# Patient Record
Sex: Female | Born: 1965
Health system: Southern US, Community
[De-identification: ages and names within clinical notes are randomized; demographics above are authoritative.]

## PROBLEM LIST (undated history)

## (undated) DIAGNOSIS — Z8719 Personal history of other diseases of the digestive system: Secondary | ICD-10-CM

## (undated) DIAGNOSIS — Z87442 Personal history of urinary calculi: Secondary | ICD-10-CM

## (undated) DIAGNOSIS — I10 Essential (primary) hypertension: Secondary | ICD-10-CM

## (undated) DIAGNOSIS — Z8742 Personal history of other diseases of the female genital tract: Secondary | ICD-10-CM

## (undated) DIAGNOSIS — Z87410 Personal history of cervical dysplasia: Secondary | ICD-10-CM

## (undated) DIAGNOSIS — E119 Type 2 diabetes mellitus without complications: Secondary | ICD-10-CM

## (undated) DIAGNOSIS — N200 Calculus of kidney: Secondary | ICD-10-CM

## (undated) DIAGNOSIS — N201 Calculus of ureter: Secondary | ICD-10-CM

## (undated) DIAGNOSIS — G629 Polyneuropathy, unspecified: Secondary | ICD-10-CM

## (undated) DIAGNOSIS — M545 Low back pain, unspecified: Secondary | ICD-10-CM

## (undated) DIAGNOSIS — Z9884 Bariatric surgery status: Secondary | ICD-10-CM

## (undated) DIAGNOSIS — K219 Gastro-esophageal reflux disease without esophagitis: Secondary | ICD-10-CM

## (undated) DIAGNOSIS — Z973 Presence of spectacles and contact lenses: Secondary | ICD-10-CM

## (undated) DIAGNOSIS — G709 Myoneural disorder, unspecified: Secondary | ICD-10-CM

## (undated) HISTORY — DX: Essential (primary) hypertension: I10

## (undated) HISTORY — PX: OTHER SURGICAL HISTORY: SHX169

## (undated) HISTORY — PX: LAPAROSCOPIC GASTRIC BANDING WITH HIATAL HERNIA REPAIR: SHX6351

## (undated) HISTORY — PX: CARPAL TUNNEL RELEASE: SHX101

## (undated) HISTORY — DX: Low back pain, unspecified: M54.50

## (undated) HISTORY — DX: Polyneuropathy, unspecified: G62.9

---
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
## 1998-01-11 ENCOUNTER — Other Ambulatory Visit: Admission: RE | Admit: 1998-01-11 | Discharge: 1998-01-11 | Payer: Self-pay | Admitting: Obstetrics and Gynecology

## 1998-10-20 ENCOUNTER — Encounter: Admission: RE | Admit: 1998-10-20 | Discharge: 1999-01-18 | Payer: Self-pay | Admitting: Family Medicine

## 1999-01-14 ENCOUNTER — Other Ambulatory Visit: Admission: RE | Admit: 1999-01-14 | Discharge: 1999-01-14 | Payer: Self-pay | Admitting: Obstetrics and Gynecology

## 1999-02-11 ENCOUNTER — Other Ambulatory Visit: Admission: RE | Admit: 1999-02-11 | Discharge: 1999-02-11 | Payer: Self-pay | Admitting: Obstetrics and Gynecology

## 1999-02-11 ENCOUNTER — Encounter (INDEPENDENT_AMBULATORY_CARE_PROVIDER_SITE_OTHER): Payer: Self-pay

## 1999-05-17 ENCOUNTER — Ambulatory Visit (HOSPITAL_BASED_OUTPATIENT_CLINIC_OR_DEPARTMENT_OTHER): Admission: RE | Admit: 1999-05-17 | Discharge: 1999-05-17 | Payer: Self-pay | Admitting: Orthopaedic Surgery

## 1999-09-07 ENCOUNTER — Other Ambulatory Visit: Admission: RE | Admit: 1999-09-07 | Discharge: 1999-09-07 | Payer: Self-pay | Admitting: Obstetrics and Gynecology

## 2001-05-20 ENCOUNTER — Emergency Department (HOSPITAL_COMMUNITY): Admission: EM | Admit: 2001-05-20 | Discharge: 2001-05-20 | Payer: Self-pay | Admitting: Emergency Medicine

## 2001-05-20 ENCOUNTER — Encounter: Payer: Self-pay | Admitting: Emergency Medicine

## 2002-08-12 ENCOUNTER — Other Ambulatory Visit: Admission: RE | Admit: 2002-08-12 | Discharge: 2002-08-12 | Payer: Self-pay | Admitting: Obstetrics and Gynecology

## 2002-11-19 ENCOUNTER — Encounter: Admission: RE | Admit: 2002-11-19 | Discharge: 2003-02-17 | Payer: Self-pay | Admitting: Family Medicine

## 2003-08-14 ENCOUNTER — Other Ambulatory Visit: Admission: RE | Admit: 2003-08-14 | Discharge: 2003-08-14 | Payer: Self-pay | Admitting: Obstetrics and Gynecology

## 2004-06-03 ENCOUNTER — Ambulatory Visit (HOSPITAL_COMMUNITY): Admission: RE | Admit: 2004-06-03 | Discharge: 2004-06-03 | Payer: Self-pay | Admitting: Obstetrics and Gynecology

## 2004-06-14 ENCOUNTER — Ambulatory Visit: Payer: Self-pay | Admitting: *Deleted

## 2004-08-01 ENCOUNTER — Encounter: Admission: RE | Admit: 2004-08-01 | Discharge: 2004-10-30 | Payer: Self-pay | Admitting: Endocrinology

## 2004-09-07 ENCOUNTER — Other Ambulatory Visit: Admission: RE | Admit: 2004-09-07 | Discharge: 2004-09-07 | Payer: Self-pay | Admitting: Obstetrics and Gynecology

## 2004-10-18 ENCOUNTER — Ambulatory Visit (HOSPITAL_COMMUNITY): Admission: RE | Admit: 2004-10-18 | Discharge: 2004-10-18 | Payer: Self-pay | Admitting: Obstetrics and Gynecology

## 2004-10-25 ENCOUNTER — Ambulatory Visit: Payer: Self-pay | Admitting: *Deleted

## 2004-10-25 ENCOUNTER — Ambulatory Visit (HOSPITAL_COMMUNITY): Admission: RE | Admit: 2004-10-25 | Discharge: 2004-10-25 | Payer: Self-pay | Admitting: *Deleted

## 2005-02-14 ENCOUNTER — Inpatient Hospital Stay (HOSPITAL_COMMUNITY): Admission: AD | Admit: 2005-02-14 | Discharge: 2005-02-14 | Payer: Self-pay | Admitting: Obstetrics and Gynecology

## 2005-02-17 ENCOUNTER — Inpatient Hospital Stay (HOSPITAL_COMMUNITY): Admission: AD | Admit: 2005-02-17 | Discharge: 2005-02-17 | Payer: Self-pay | Admitting: Obstetrics and Gynecology

## 2005-03-14 ENCOUNTER — Inpatient Hospital Stay (HOSPITAL_COMMUNITY): Admission: AD | Admit: 2005-03-14 | Discharge: 2005-03-18 | Payer: Self-pay | Admitting: Obstetrics and Gynecology

## 2005-03-21 ENCOUNTER — Inpatient Hospital Stay (HOSPITAL_COMMUNITY): Admission: AD | Admit: 2005-03-21 | Discharge: 2005-03-23 | Payer: Self-pay | Admitting: Obstetrics and Gynecology

## 2005-10-03 ENCOUNTER — Emergency Department (HOSPITAL_COMMUNITY): Admission: EM | Admit: 2005-10-03 | Discharge: 2005-10-03 | Payer: Self-pay | Admitting: Emergency Medicine

## 2006-01-11 ENCOUNTER — Other Ambulatory Visit: Admission: RE | Admit: 2006-01-11 | Discharge: 2006-01-11 | Payer: Self-pay | Admitting: Obstetrics and Gynecology

## 2006-06-20 ENCOUNTER — Ambulatory Visit (HOSPITAL_COMMUNITY): Admission: RE | Admit: 2006-06-20 | Discharge: 2006-06-20 | Payer: Self-pay | Admitting: Obstetrics and Gynecology

## 2006-12-18 ENCOUNTER — Encounter: Admission: RE | Admit: 2006-12-18 | Discharge: 2007-03-06 | Payer: Self-pay | Admitting: *Deleted

## 2006-12-18 ENCOUNTER — Ambulatory Visit (HOSPITAL_COMMUNITY): Admission: RE | Admit: 2006-12-18 | Discharge: 2006-12-18 | Payer: Self-pay | Admitting: *Deleted

## 2007-01-07 ENCOUNTER — Ambulatory Visit (HOSPITAL_COMMUNITY): Admission: RE | Admit: 2007-01-07 | Discharge: 2007-01-07 | Payer: Self-pay | Admitting: *Deleted

## 2007-03-26 ENCOUNTER — Encounter: Admission: RE | Admit: 2007-03-26 | Discharge: 2007-06-24 | Payer: Self-pay | Admitting: *Deleted

## 2007-04-09 ENCOUNTER — Ambulatory Visit (HOSPITAL_COMMUNITY): Admission: RE | Admit: 2007-04-09 | Discharge: 2007-04-10 | Payer: Self-pay | Admitting: *Deleted

## 2007-04-09 DIAGNOSIS — Z9884 Bariatric surgery status: Secondary | ICD-10-CM

## 2007-04-09 HISTORY — DX: Bariatric surgery status: Z98.84

## 2007-06-25 ENCOUNTER — Ambulatory Visit (HOSPITAL_COMMUNITY): Admission: RE | Admit: 2007-06-25 | Discharge: 2007-06-25 | Payer: Self-pay | Admitting: Obstetrics and Gynecology

## 2007-08-06 ENCOUNTER — Encounter: Admission: RE | Admit: 2007-08-06 | Discharge: 2007-08-06 | Payer: Self-pay | Admitting: *Deleted

## 2007-09-03 ENCOUNTER — Emergency Department (HOSPITAL_COMMUNITY): Admission: EM | Admit: 2007-09-03 | Discharge: 2007-09-03 | Payer: Self-pay | Admitting: Emergency Medicine

## 2008-05-29 ENCOUNTER — Emergency Department (HOSPITAL_COMMUNITY): Admission: EM | Admit: 2008-05-29 | Discharge: 2008-05-29 | Payer: Self-pay | Admitting: Emergency Medicine

## 2008-07-09 ENCOUNTER — Ambulatory Visit (HOSPITAL_COMMUNITY): Admission: RE | Admit: 2008-07-09 | Discharge: 2008-07-09 | Payer: Self-pay | Admitting: Obstetrics and Gynecology

## 2008-10-02 ENCOUNTER — Ambulatory Visit (HOSPITAL_BASED_OUTPATIENT_CLINIC_OR_DEPARTMENT_OTHER): Admission: RE | Admit: 2008-10-02 | Discharge: 2008-10-02 | Payer: Self-pay | Admitting: Orthopaedic Surgery

## 2009-03-12 ENCOUNTER — Encounter: Admission: RE | Admit: 2009-03-12 | Discharge: 2009-03-12 | Payer: Self-pay | Admitting: Internal Medicine

## 2009-04-20 ENCOUNTER — Emergency Department (HOSPITAL_COMMUNITY): Admission: EM | Admit: 2009-04-20 | Discharge: 2009-04-21 | Payer: Self-pay | Admitting: Emergency Medicine

## 2009-05-13 DIAGNOSIS — E282 Polycystic ovarian syndrome: Secondary | ICD-10-CM

## 2009-05-13 DIAGNOSIS — I10 Essential (primary) hypertension: Secondary | ICD-10-CM | POA: Insufficient documentation

## 2009-05-14 ENCOUNTER — Encounter: Admission: RE | Admit: 2009-05-14 | Discharge: 2009-05-14 | Payer: Self-pay | Admitting: Pulmonary Disease

## 2009-05-14 ENCOUNTER — Ambulatory Visit: Payer: Self-pay | Admitting: Pulmonary Disease

## 2009-05-14 DIAGNOSIS — J309 Allergic rhinitis, unspecified: Secondary | ICD-10-CM | POA: Insufficient documentation

## 2009-05-14 DIAGNOSIS — R05 Cough: Secondary | ICD-10-CM

## 2009-05-14 DIAGNOSIS — E119 Type 2 diabetes mellitus without complications: Secondary | ICD-10-CM | POA: Insufficient documentation

## 2009-05-19 ENCOUNTER — Telehealth (INDEPENDENT_AMBULATORY_CARE_PROVIDER_SITE_OTHER): Payer: Self-pay | Admitting: *Deleted

## 2009-05-26 ENCOUNTER — Ambulatory Visit: Payer: Self-pay | Admitting: Pulmonary Disease

## 2009-05-26 DIAGNOSIS — J069 Acute upper respiratory infection, unspecified: Secondary | ICD-10-CM | POA: Insufficient documentation

## 2009-06-15 ENCOUNTER — Encounter: Admission: RE | Admit: 2009-06-15 | Discharge: 2009-06-15 | Payer: Self-pay | Admitting: Surgery

## 2009-07-12 ENCOUNTER — Ambulatory Visit (HOSPITAL_COMMUNITY): Admission: RE | Admit: 2009-07-12 | Discharge: 2009-07-12 | Payer: Self-pay | Admitting: Obstetrics and Gynecology

## 2010-01-03 ENCOUNTER — Encounter: Admission: RE | Admit: 2010-01-03 | Discharge: 2010-01-03 | Payer: Self-pay | Admitting: Surgery

## 2010-02-14 ENCOUNTER — Ambulatory Visit (HOSPITAL_COMMUNITY): Admission: RE | Admit: 2010-02-14 | Discharge: 2010-02-14 | Payer: Self-pay | Admitting: Surgery

## 2010-04-17 ENCOUNTER — Encounter: Payer: Self-pay | Admitting: Obstetrics and Gynecology

## 2010-04-28 NOTE — Assessment & Plan Note (Signed)
Summary: rov for cough   Copy to:  [name] Primary Provider/Referring Provider:  [name]  CC:  Pt is here for a 10 day f/u appt.  Pt states cough has improved.  Pt states she is now coughing up clear sputum but believes this is d/t a "cold" she currently has.  Pt also c/o sore throat and chest 'hurts" and increased sob with exertion and at rest.   Pt states she is still using albuterol neb prn .  History of Present Illness: The pt comes in today for f/u of her chronic cough.  Her cough totally resolved with the cyclical cough protocol and behavioral techniques.  However, now she has developed what sounds like a viral URI.  She has had rhinorrhea, sore throat, and now a wet cough with clear mucus.  No f/c/s.  She has had some upper airway noise, and does feel a little more sob at times.  Current Medications (verified): 1)  Multivitamins  Tabs (Multiple Vitamin) .Marland Kitchen.. 1 Once Daily 2)  Goodys Body Pain 500-325 Mg Pack (Aspirin-Acetaminophen) .... As Directed As Needed 3)  Nasal Moisturizer 0.65 % Soln (Saline) .... 2 Sprays Each Nostril Every 3 Hours As Needed 4)  Protonix 40 Mg Tbec (Pantoprazole Sodium) .Marland Kitchen.. 1 Once Daily 5)  Claritin 10 Mg Caps (Loratadine) .Marland Kitchen.. 1 Once Daily 6)  Albuterol Sulfate (2.5 Mg/95ml) 0.083%  Nebu (Albuterol Sulfate) .Marland Kitchen.. 1 Vial in Nebulizer Four Times A Day As Needed 7)  Tessalon Perles 100 Mg  Caps (Benzonatate) .... One To Two By Mouth 3-4 Times Daily 8)  Tussionex Pennkinetic Er 8-10 Mg/91ml Lqcr (Chlorpheniramine-Hydrocodone) .Marland Kitchen.. 1 Tsp By Mouth Every 12 Hours As Needed 9)  Nasonex 50 Mcg/act Susp (Mometasone Furoate) .... 2 Sprays in Each Nostril Each  Morning  Allergies (verified): 1)  ! Biaxin  Review of Systems      See HPI  Vital Signs:  Patient profile:   45 year old female Height:      62 inches Weight:      118.25 pounds BMI:     21.71 O2 Sat:      100 % on Room air Temp:     97.6 degrees F oral Pulse rate:   73 / minute Cuff size:    regular  Vitals Entered By: Arman Filter LPN (May 26, 9560 11:30 AM)  O2 Flow:  Room air CC: Pt is here for a 10 day f/u appt.  Pt states cough has improved.  Pt states she is now coughing up clear sputum but believes this is d/t a "cold" she currently has.  Pt also c/o sore throat, chest 'hurts" and increased sob with exertion and at rest.   Pt states she is still using albuterol neb prn  Comments Medications reviewed with patient Arman Filter LPN  May 27, 1306 11:36 AM    Physical Exam  General:  thin female in nad Nose:  no purulence noted. Lungs:  totally clear to auscultation no wheezing or rhonchi Heart:  rrr Extremities:  no edema or cyanosis   Impression & Recommendations:  Problem # 1:  URI (ICD-465.9)  the pt states her current cough is due to a respiratory illness, and is not the same as her dry hacky cough I saw her for initially.  It is most likely viral in nature, but she will let us know if she begins to cough up purulence.  She can use otc meds for this.  I did remind her  that she still needs a repeat spirometry when the cough is better to make sure she doesn't have airway disease.  She had great difficulty at prior testing due to cough and probably upper airway edema, with truncation of FVL.  Problem # 2:  COUGH, CHRONIC (ICD-786.2)  this resolved with cyclical cough protocol and behavioral techniques.  I have reminded her this can recur if she starts to cough and lets it begin to escalate and create upper airway irritation again.  Medications Added to Medication List This Visit: 1)  Nasonex 50 Mcg/act Susp (Mometasone furoate) .... 2 sprays in each nostril each  morning  Other Orders: Est. Patient Level III (91478)  Patient Instructions: 1)  can try over the counter meds for your cold symptoms....dayquil, tylenol cold and sinus, etc 2)  don't forget to use hard candy, avoid overuse of voice, no throat clearing, to help keep cough down until you are over  your cold. 3)  try and avoid sick children, or use alcohol gel a lot. 4)  once you are over your cold, we need to repeat your breathing studies to make sure you do not have asthma.  Please call to schedule once cold is over.   Immunization History:  Influenza Immunization History:    Influenza:  historical (03/27/2009)

## 2010-04-28 NOTE — Assessment & Plan Note (Signed)
Summary: consult for chronic cough   Copy to:  Stacey Riddle Primary Provider/Referring Provider:  Joselyn Riddle  CC:  Pulmonary Consult.  History of Present Illness: The pt comes in today for evaluation of chronic cough.  Her cough started in Oct of last year with a "head cold".  This resolved, but her cough persisted at a moderate level until Jan of this year when it began to escalate.  She began to notice sob with her cough paroxysms, and describes classic upper airway pseudowheezing.  She also had gurgling in her throat area.  Her cough is primarily dry, and is worse on lying down and with prolonged conversation.  She does have a nasal voice, sinus congestion, and chronic throat clearing.  She denies any reflux symptoms currently, but had issues with this prior to her lap band.  She has been treated with abx, prednisone, and bronchodilators with no significant improvement.  A cxr done last month showed no acute process.  Preventive Screening-Counseling & Management  Alcohol-Tobacco     Smoking Status: never  Current Medications (verified): 1)  Multivitamins  Tabs (Multiple Vitamin) .Marland Kitchen.. 1 Once Daily 2)  Goodys Body Pain 500-325 Mg Pack (Aspirin-Acetaminophen) .... As Directed As Needed 3)  Nasal Moisturizer 0.65 % Soln (Saline) .... 2 Sprays Each Nostril Every 3 Hours As Needed 4)  Protonix 40 Mg Tbec (Pantoprazole Sodium) .Marland Kitchen.. 1 Once Daily 5)  Claritin 10 Mg Caps (Loratadine) .Marland Kitchen.. 1 Once Daily 6)  Proair Hfa 108 (90 Base) Mcg/act  Aers (Albuterol Sulfate) .Marland Kitchen.. 1-2 Puffs Every 4-6 Hours As Needed 7)  Albuterol Sulfate (2.5 Mg/72ml) 0.083%  Nebu (Albuterol Sulfate) .Marland Kitchen.. 1 Vial in Nebulizer Four Times A Day As Needed  Allergies (verified): 1)  ! Biaxin  Past History:  Past Medical History:   ALLERGIC RHINITIS (ICD-477.9) DM (ICD-250.00) POLYCYSTIC OVARIAN DISEASE (ICD-256.4) Hx of HYPERTENSION (ICD-401.9)    Past Surgical History: Lap band surgery 1/09 C section Carpal tunnel  release- both hands  Family History: Reviewed history and no changes required. heart disease: father cancer: mother (breast)   Social History: Reviewed history from 05/13/2009 and no changes required. 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  Smoking Status:  never  Review of Systems       The patient complains of shortness of breath with activity, non-productive cough, and weight change.  The patient denies shortness of breath at rest, productive cough, coughing up blood, chest pain, irregular heartbeats, acid heartburn, indigestion, loss of appetite, abdominal pain, difficulty swallowing, sore throat, tooth/dental problems, headaches, nasal congestion/difficulty breathing through nose, sneezing, itching, ear ache, anxiety, depression, hand/feet swelling, joint stiffness or pain, rash, change in color of mucus, and fever.    Vital Signs:  Patient profile:   45 year old female Height:      62 inches Weight:      121 pounds BMI:     22.21 O2 Sat:      99 % on Room air Temp:     98.3 degrees F oral Pulse rate:   93 / minute BP sitting:   136 / 86  (right arm) Cuff size:   regular  Vitals Entered By: Stacey Filter LPN (May 14, 2009 2:31 PM)  O2 Flow:  Room air CC: Pulmonary Consult Comments Medications reviewed with patient Stacey Filter LPN  May 14, 2009 2:32 PM    Physical Exam  General:  thin female in nad Eyes:  PERRLA and EOMI.   Nose:  swollen and erythematous turbinates,  no purulence noted. Mouth:  mucus in back of throat. Neck:  no jvd, ?right sided thyromegaly Lungs:  totally clear to auscultation Heart:  rrr, no mrg Abdomen:  soft and nontender, bs+ Extremities:  no edema noted, pulses intact distally no cyanosis Neurologic:  alert and oriented, moves all 4.   Impression & Recommendations:  Problem # 1:  COUGH, CHRONIC (ICD-786.2) the pt's cough sounds more upper airway in origin than lower.  She had difficutly with spirometry  today, but did not show obvious obstruction.  However, there was an abnormal flow volume loop that may be due to upper airway edema from all of her coughing.  She has a very nasal voice and sinus symptoms, so I wonder if she may have chronic sinusitis driving all of this.  I suspect the main cause of her cough is a cyclical mechanism after her URI.  I have had a long conversation with her about how we treat upper airway cough.  I have asked her to stay on reflux meds, to try nasal ICS for her swollen turbs noted on exam with nasal congestion, and will also check limited ct sinuses for completeness with persistent symptoms.  I have recommended starting her on the cyclical cough protocol, and have given her an instruction sheet.  She is to start this when she has 3 consecutive days to complete.  Medications Added to Medication List This Visit: 1)  Proair Hfa 108 (90 Base) Mcg/act Aers (Albuterol sulfate) .Marland Kitchen.. 1-2 puffs every 4-6 hours as needed 2)  Albuterol Sulfate (2.5 Mg/78ml) 0.083% Nebu (Albuterol sulfate) .Marland Kitchen.. 1 vial in nebulizer four times a day as needed 3)  Tessalon Perles 100 Mg Caps (Benzonatate) .... One to two by mouth 3-4 times daily 4)  Tussicaps 10-8 Mg Xr12h-cap (Hydrocod polst-chlorphen polst) .... One every 12hrs if needed.  Other Orders: Consultation Level IV (16109) Radiology Referral (Radiology) Spirometry w/Graph (60454)  Patient Instructions: 1)  take 3 days to do the cyclical cough protocol..See sheet. 2)  minimize your voice use until you can do protocol 3)  no throat clearing, use hard candy to bathe back of throat. 4)  will check xray of sinuses 5)  nasonex 2 each nostril each am. 6)  stop all inhalers. 7)  f/u with me in 10 days.  Prescriptions: TUSSICAPS 10-8 MG XR12H-CAP (HYDROCOD POLST-CHLORPHEN POLST) one every 12hrs if needed.  #12 x 0   Entered and Authorized by:   Stacey Share MD   Signed by:   Stacey Share MD on 05/14/2009   Method used:   Print then  Give to Patient   RxID:   [id number] TESSALON PERLES 100 MG  CAPS (BENZONATATE) One to two by mouth 3-4 times daily  #30 x 1   Entered and Authorized by:   Stacey Share MD   Signed by:   Stacey Share MD on 05/14/2009   Method used:   Print then Give to Patient   RxID:   [id number]    CardioPerfect Spirometry  ID: [id number] Patient: Stacey Riddle, Stacey Riddle DOB: [birth year] Age: 45 Years Old Sex: Female Race: Black Height: 62 Weight: 121 Status: Unconfirmed Past Medical History:  Current Problems:  POLYCYSTIC OVARIAN DISEASE (ICD-256.4) Hx of HYPERLIPIDEMIA (ICD-272.4) Hx of HYPERTENSION (ICD-401.9)   Recorded: 05/14/2009 3:40 PM  Parameter  Measured Predicted %Predicted FVC     3.06        2.77        110.20 FEV1  1.80        2.26        79.60 FEV1%   59.03        82.68        71.40 PEF    2.14        6.11        35   Interpretation: no obstruction by FEV1 or FEV1% FVL shows truncation of expir limb.Marland KitchenMarland Kitchen?technique, airway edema, etc. clinical correlation suggested

## 2010-04-28 NOTE — Progress Notes (Signed)
Summary: rx  Phone Note Call from Patient Call back at Home Phone [phone]   Caller: Patient Call For: Clance Summary of Call: pt unable to take the tussicaps.  They are too big for her to swallow.  She needs the Tussi syrup called in instead. [facility] [place] Initial call taken by: Eugene Gavia,  May 19, 2009 12:00 PM  Follow-up for Phone Call        Please advise, thanks Vernie Murders  May 19, 2009 12:02 PM   Additional Follow-up for Phone Call Additional follow up Details #1::        please call pharmacy and cancel prescription for tussicaps.  can have tussionex but use one teaspoon as the equivalent of one dose of tussicaps.  stick to the protocol. Additional Follow-up by: Barbaraann Share MD,  May 19, 2009 5:23 PM    Additional Follow-up for Phone Call Additional follow up Details #2::    Spoke with pt and made aware of the above recs per [facility].  Pt verbalized understading.  New rx called in for tussionex and I advised pharmacist to cancel rx for tussicaps. Follow-up by: Vernie Murders,  May 19, 2009 5:31 PM  New/Updated Medications: Sandria Senter ER 8-10 MG/5ML LQCR (CHLORPHENIRAMINE-HYDROCODONE) 1 tsp by mouth every 12 hours as needed Prescriptions: TUSSIONEX PENNKINETIC ER 8-10 MG/5ML LQCR (CHLORPHENIRAMINE-HYDROCODONE) 1 tsp by mouth every 12 hours as needed  #4 0z x 0   Entered by:   Vernie Murders   Authorized by:   Barbaraann Share MD   Signed by:   Vernie Murders on 05/19/2009   Method used:   Telephoned to ...       [facility]       [address]       [place], [place]  [address]       Ph: [phone]       Fax: [phone]   RxID:   [id number]

## 2010-06-07 LAB — CBC
Hemoglobin: 11.8 g/dL — ABNORMAL LOW (ref 12.0–15.0)
MCH: 28.4 pg (ref 26.0–34.0)
MCV: 83.4 fL (ref 78.0–100.0)
WBC: 9.3 10*3/uL (ref 4.0–10.5)

## 2010-06-07 LAB — COMPREHENSIVE METABOLIC PANEL
BUN: 9 mg/dL (ref 6–23)
CO2: 28 mEq/L (ref 19–32)
Calcium: 9 mg/dL (ref 8.4–10.5)
Chloride: 104 mEq/L (ref 96–112)
GFR calc non Af Amer: >60 mL/min (ref >60)
Glucose, Bld: 192 mg/dL — ABNORMAL HIGH (ref 70–99)
Potassium: 4 mEq/L (ref 3.5–5.1)
Total Protein: 7.1 g/dL (ref 6.0–8.3)

## 2010-06-07 LAB — DIFFERENTIAL
Basophils Absolute: 0 10*3/uL (ref 0.0–0.1)
Eosinophils Absolute: 0.1 10*3/uL (ref 0.0–0.7)
Lymphocytes Relative: 22 % (ref 12–46)
Lymphs Abs: 2 10*3/uL (ref 0.7–4.0)
Monocytes Absolute: 0.6 10*3/uL (ref 0.1–1.0)
Neutrophils Relative %: 70 % (ref 43–77)

## 2010-06-07 LAB — PREGNANCY, URINE: Preg Test, Ur: NEGATIVE

## 2010-07-04 LAB — GLUCOSE, CAPILLARY: Glucose-Capillary: 143 mg/dL — ABNORMAL HIGH (ref 70–99)

## 2010-07-04 LAB — POCT I-STAT, CHEM 8
BUN: 9 mg/dL (ref 6–23)
HCT: 39 % (ref 36.0–46.0)
Sodium: 142 mEq/L (ref 135–145)
TCO2: 29 mmol/L (ref 0–100)

## 2010-07-04 LAB — BASIC METABOLIC PANEL
BUN: 9 mg/dL (ref 6–23)
CO2: 30 mEq/L (ref 19–32)
Calcium: 9.1 mg/dL (ref 8.4–10.5)
GFR calc Af Amer: >60 mL/min (ref >60)
GFR calc non Af Amer: >60 mL/min (ref >60)
Glucose, Bld: 144 mg/dL — ABNORMAL HIGH (ref 70–99)

## 2010-07-05 ENCOUNTER — Other Ambulatory Visit (HOSPITAL_COMMUNITY): Payer: Self-pay | Admitting: Obstetrics and Gynecology

## 2010-07-05 DIAGNOSIS — Z1231 Encounter for screening mammogram for malignant neoplasm of breast: Secondary | ICD-10-CM

## 2010-07-14 ENCOUNTER — Ambulatory Visit (HOSPITAL_COMMUNITY)
Admission: RE | Admit: 2010-07-14 | Discharge: 2010-07-14 | Disposition: A | Discharge status: Home / Self Care | Payer: 59 | Source: Ambulatory Visit | Attending: Obstetrics and Gynecology | Admitting: Obstetrics and Gynecology

## 2010-07-14 DIAGNOSIS — Z1231 Encounter for screening mammogram for malignant neoplasm of breast: Secondary | ICD-10-CM

## 2010-08-09 ENCOUNTER — Emergency Department (HOSPITAL_COMMUNITY): Payer: 59

## 2010-08-09 ENCOUNTER — Emergency Department (HOSPITAL_COMMUNITY)
Admission: EM | Admit: 2010-08-09 | Discharge: 2010-08-09 | Disposition: A | Discharge status: Home / Self Care | Payer: 59 | Attending: Emergency Medicine | Admitting: Emergency Medicine

## 2010-08-09 DIAGNOSIS — Z79899 Other long term (current) drug therapy: Secondary | ICD-10-CM | POA: Insufficient documentation

## 2010-08-09 DIAGNOSIS — Z9884 Bariatric surgery status: Secondary | ICD-10-CM | POA: Insufficient documentation

## 2010-08-09 DIAGNOSIS — I10 Essential (primary) hypertension: Secondary | ICD-10-CM | POA: Insufficient documentation

## 2010-08-09 DIAGNOSIS — E119 Type 2 diabetes mellitus without complications: Secondary | ICD-10-CM | POA: Insufficient documentation

## 2010-08-09 DIAGNOSIS — R51 Headache: Secondary | ICD-10-CM | POA: Insufficient documentation

## 2010-08-09 NOTE — Op Note (Signed)
[name], Riddle         ACCOUNT NO.:  [id number]   MEDICAL RECORD NO.:  [id number]          PATIENT TYPE:  OIB   LOCATION:  0098                         FACILITY:  [facility]   PHYSICIAN:  Thornton Park. Daphine Deutscher, MD  DATE OF BIRTH:  08/25/1965   DATE OF PROCEDURE:  04/09/2007  DATE OF DISCHARGE:                               OPERATIVE REPORT   PREOPERATIVE DIAGNOSES:  1. Insulin-dependent diabetes.  2. Hypertension.  3. Significant gastroesophageal reflux disease.  4. Morbid obesity, BMI 38.5.   POSTOPERATIVE DIAGNOSES:  1. Insulin-dependent diabetes.  2. Hypertension.  3. Significant gastroesophageal reflux disease.  4. Morbid obesity, BMI 38.5.   PROCEDURE:  Laparoscopic repair of hiatal hernia (1 hour) placement of  lap band (Allergan APS lap band system).   SURGEON:  Luretha Murphy, MD.   ASSISTANT:  Jaclynn Guarneri, MD.   ANESTHESIA:  General endotracheal.   DESCRIPTION OF PROCEDURE:  Stacey Riddle was taken to room 1 on April 09, 2007 and given general anesthesia.  The abdomen was prepped with  TechniCare and draped sterilely.  The abdomen was entered through the  left upper quadrant using an OptiVu technique.  She had very thick skin  and tough fascia but I went ahead and entered slowly and carefully and  insufflated it and was able to insert the trocar to its standard depth.  After insufflation, we placed the usual trocars including a 15 in the  upper midline to the right, but I also had a place an extra 5 laterally  eventually to use the squiggly retractor to lift the liver.  The left  lateral segment was quite floppy and despite the [facility] retractor, we  were unable to get it up unless we used Mr. Hershal Coria.   I had her upper GI series on the screen and you could see a hiatal  hernia which was deceptively under played on the upper GI. She does have  significant reflux on her preop history.  First I mobilized the hiatal  area and took down the phrenicoesophageal  ligaments and there found the  esophagus was up in the chest.  We got a Penrose drain around it and we  retracted it and got it freed up and into the abdomen.  At that point, I  was able to place two sutures posteriorly through the crura to tighten  the crura and this corrected the hernia.  Prior to evening doing  anything, I did go ahead and use of the APS balloon catheter and I was  able to pull that above the GE junction with 15 mL of air indicating  that there was a significant hiatal hernia.  After repairing it, I then  went down on the right crura, created a separate spot and passed a band  passer up and around.  The APS band was introduced, brought around,  snapped in place and secured with three sutures plicating the stomach up  onto the little new small pouch.  It was then brought out through the  right-sided lower trocar.  I opened it and created a pocket and opened  this up a bit.  After  I removed all the retractors and looked around  everything appeared to be in order. I deflated the abdomen taking  everything out and then created the pocket right on the fascia and after  connecting the tubing installed it with three sutures of 2-0 Prolene  securing it nicely to the fascia. It lay nicely.   I then irrigated well, injected with some 1/2% Marcaine and then closed  the pocket around the band port with 4-0 Vicryls and Benzoin and Steri-  Strips.  In addition the other ports were closed with 4-0 Vicryl with  Benzoin Steri-Strips. The patient seemed to tolerate the procedure well  and she was taken to the recovery room in satisfactory condition.      Thornton Park Daphine Deutscher, MD  Electronically Signed     MBM/MEDQ  D:  04/09/2007  T:  04/09/2007  Job:  [id number]   cc:   Lavonda Jumbo, M.D.  Fax: [phone]   Dorisann Frames, M.D.  Fax: [phone]

## 2010-08-09 NOTE — Op Note (Signed)
NAME:  Stacey Riddle, Stacey Riddle         ACCOUNT NO.:  [id number]   MEDICAL RECORD NO.:  [id number]          PATIENT TYPE:  AMB   LOCATION:  DSC                          FACILITY:  [facility]   PHYSICIAN:  Vanita Panda. Magnus Ivan, M.D.DATE OF BIRTH:  [birth year]   DATE OF PROCEDURE:  10/02/2008  DATE OF DISCHARGE:                               OPERATIVE REPORT   PREOPERATIVE DIAGNOSIS:  Left wrist and hand carpal tunnel syndrome.   POSTOPERATIVE DIAGNOSIS:  Left wrist and hand carpal tunnel syndrome.   PROCEDURE:  Left open carpal tunnel release.   SURGEON:  Vanita Panda. Magnus Ivan, MD   ANESTHESIA:  General.   TOURNIQUET TIME:  11 minutes.   BLOOD LOSS:  Minimal.   COMPLICATIONS:  None.   INDICATIONS:  [name] is a 45 year old with a history of  bilateral carpal tunnel syndrome.  She has had previous right open  carpal tunnel release, and now elected to undergo a left open carpal  tunnel release.  She has had persistent numbness and tingling and  positive Phalen and Tinel sign and difficulty gripping objects.  She  understood the risks and benefits of the surgery and we want to try to  do this under Bier block.   PROCEDURE. DESCRIPTION:  After informed consent was obtained,  appropriate left wrist was marked.  She was brought to the operating  room and placed supine on the operating table.  They attempted second IV  for Bier block, were unable to do this, so they proceeded with general  anesthesia via an LMA.  Her left hand was then prepped and draped with  DuraPrep and sterile drapes including a sterile stockinette.  A time-out  was called to identify the correct patient and the correct left wrist.  An Esmarch was used to wrap out the wrist and tourniquet was inflated to  300 mmHg of pressure.  I then made an incision on the palm of the hand  at the level of Kaplan's cardinal line.  They were dissected down to the  distal edge of the transverse carpal ligament.  I then used  a protector  above the median nerve and was able to dissect with a blade and divide  the transverse carpal ligament in its entirety.  I was able to visualize  the median nerve as well as the motor branch and these were intact.  I  then copiously irrigated the wound and closed the tissue with  interrupted 3-0 nylon suture at the skin level.  The incision was  infiltrated with 0.25% plain Marcaine.  A Xeroform followed by a well-  padded sterile dressing was applied.  Tourniquet was let down and the  fingers did pink in nicely.  The patient was awakened, extubated, and  taken to the recovery room in stable  condition.  All final counts were correct and no complications noted.  Postoperatively, she remained in the splint with removing of her  dressings in 2 days and slowly work on range of motion of her fingers  and wrist.  Follow up in the office will be in 2 weeks.      Vanita Panda.  Magnus Ivan, M.D.  Electronically Signed     CYB/MEDQ  D:  10/02/2008  T:  10/02/2008  Job:  [id number]

## 2010-08-12 NOTE — Discharge Summary (Signed)
Stacey Riddle, Stacey Riddle         ACCOUNT NO.:  [id number]   MEDICAL RECORD NO.:  [id number]          PATIENT TYPE:  INP   LOCATION:  9317                          FACILITY:  [facility]   PHYSICIAN:  Huel Cote, M.D. DATE OF BIRTH:  Oct 21, 1965   DATE OF ADMISSION:  03/21/2005  DATE OF DISCHARGE:                                 DISCHARGE SUMMARY   DISCHARGE DIAGNOSES:  1.  Status post cesarean section on March 16, 2005, who developed a post      surgical incision infection requiring intravenous antibiotics and      drainage.  2.  Diabetes mellitus.  3.  Chronic hypertension   DISCHARGE MEDICATIONS:  1.  Augmentin 875 mg p.o. b.i.d.  2.  Percocet one to two tablets p.o. every 4 hours p.r.n.  3.  Insulin pump, to have her settings as directed by Dr. Talmage Nap.  4.  Labetalol 400 mg p.o. b.i.d.  5.  Aldomet 500 mg p.o. q.a.m. and 1000 mg p.o. q.h.s.   DISCHARGE FOLLOWUP:  The patient is to follow up with Dr. Talmage Nap in the next  several days. If she is unable to get in with her office she will come into  our office for a blood pressure check. She also needs to return our office  within 7-10 days for an incision check.   DISCHARGE INSTRUCTIONS:  The patient will continue wet-to-dry dressing  changes twice daily. Her mother has been instructed on these and she has  been given the materials to perform these until coming into the office.   HOSPITAL COURSE:  The patient is a 45 year old G1 P1 who came in 6 days  after her low transverse cesarean section with an infection of her incision  which required opening and draining of some purulent material. This was  approximately 3 or 4 inches long on the left side of the incision. The  patient did have a temperature on arrival of 101.5 and a white blood cell  count of 20,000. Therefore, with her diabetes and other risk factors it was  felt that she should be admitted for IV antibiotic therapy. She was admitted  and placed on IV Unasyn and  received over 24 hours of therapy with her fever  defervescing very quickly. On hospital day #2 she had been afebrile since  admission and was felt stable for discharge home with outpatient regimen to  care for her incision. She was therefore discharged home with wet-to-dry day  dressing changes to be performed twice daily and is going to be placed on  Augmentin p.o. for continued antibiotic coverage. The patient also will be  followed by Dr. Talmage Nap to adjust her insulin pump as needed. Her blood sugars  have been relatively well controlled in the 87-164 range. Her blood  pressures were 173-  193 over 82-101. Therefore, her labetalol was increased to 400 mg p.o. twice  daily and she will confer with Dr. Talmage Nap whether they wish to continue this  medicine or continue changing to another therapy. As stated, she will follow  up with one of [place] in the next 3-4 days to see how her blood  pressure is  responding.      Huel Cote, M.D.  Electronically Signed     KR/MEDQ  D:  03/23/2005  T:  03/23/2005  Job:  [id number]

## 2010-08-12 NOTE — H&P (Signed)
Stacey Riddle, Stacey Riddle         ACCOUNT NO.:  [id number]   MEDICAL RECORD NO.:  [id number]          PATIENT TYPE:  MAT   LOCATION:  [facility]                          FACILITY:  [facility]   PHYSICIAN:  Malachi Pro. Ambrose Mantle, M.D. DATE OF BIRTH:  Jun 18, 1965   DATE OF ADMISSION:  03/21/2005  DATE OF DISCHARGE:                                HISTORY & PHYSICAL   PRESENT ILLNESS:  A 45 year old black female, para 1-0-0-1, who is now six  days status post low-transverse cervical C-section for failure to progress  in labor.  The patient's pregnancy was complicated by chronic hypertension,  type 2 diabetes on an insulin pump, advanced maternal age, and Group B strep  carrier.  The patient was treated with a Glucomander during her labor.  She  was discharged three days post-op on Aldomet 500 every morning and 1,000  every afternoon, labetalol 300 by mouth twice a day, and insulin pump for  her diabetes.  After discharge, on March 18, 2005, the patient developed  chills and fever, on March 19, 2005, and then does not remember having a  fever on Christmas day but on the day after Christmas again began having  chills and fever.  In the evening of March 21, 2005, she stood up and  noticed blood and pus running out of her incision.  She brought herself to  the hospital and was evaluated in the maternity admission unit.   PAST MEDICAL HISTORY:  Revealed allergy to [facility].  It caused cramping and  stomach pain but no rash, shortness of breath, or other signs of a true  allergy.   ILLNESSES:  1.  The patient states she has had diabetes for about 13 years.  2.  She has had polycystic ovarian syndrome.  3.  Slight cervical dysplasia.  4.  Chronic hypertension.   SURGERY:  She had a right carpal tunnel release in 2000.   FAMILY HISTORY:  Father with an MI.  Parents with chronic hypertension.  Mother with breast cancer.  Father and sister have diabetes.   OBSTETRIC HISTORY:  The patient delivered by  C-section, on March 15, 2005, a 6 pound 8 ounce female infant.   PHYSICAL EXAMINATION:  VITAL SIGNS:  On admission, temperature of 101.5,  pulse of 102, respirations 20, blood pressure 147/74.  Followup temperatures  were 100.3 and 101.3.  HEENT:  Reveal no cranial abnormalities.  Extraocular movements intact.  Nose and pharynx clear.  NECK:  Supple without thyromegaly.  BREASTS:  Soft without masses.  LUNGS:  Clear to auscultation.  HEART:  Normal size.  Slight tachycardic.  No murmurs.  ABDOMEN:  Soft, nontender.  No masses palpable.  From the left side of the  incision there is a purulent drainage, staples are still intact.  PELVIC:  Not done.  There is no CVA tenderness.  LEGS:  Negative.  No evidence of phlebitis.   ADMITTING IMPRESSION:  1.  Wound infection.  2.  Type 2 diabetes.  3.  Chronic hypertension.  4.  Obesity.   The patient is admitted for IV antibiotic therapy and blood sugar control.  I removed staples  from the left side of the incision and finger dissected  the left side of the incision.  I could not find a channel to go all the way  across the incision, so it is possible that the infection is merely in the  left side of the incision.  After I drained all the purulent material and  washed the cavity with hydrogen peroxide, I placed a 1 inch iodoform pack.      Malachi Pro. Ambrose Mantle, M.D.  Electronically Signed     TFH/MEDQ  D:  03/21/2005  T:  03/21/2005  Job:  [id number]

## 2010-09-06 ENCOUNTER — Encounter (INDEPENDENT_AMBULATORY_CARE_PROVIDER_SITE_OTHER): Payer: Self-pay | Admitting: Surgery

## 2010-09-30 ENCOUNTER — Encounter (INDEPENDENT_AMBULATORY_CARE_PROVIDER_SITE_OTHER): Payer: 59 | Admitting: Surgery

## 2010-11-15 ENCOUNTER — Encounter (INDEPENDENT_AMBULATORY_CARE_PROVIDER_SITE_OTHER): Payer: Self-pay | Admitting: General Surgery

## 2010-11-16 ENCOUNTER — Encounter (INDEPENDENT_AMBULATORY_CARE_PROVIDER_SITE_OTHER): Payer: Self-pay | Admitting: Surgery

## 2010-11-16 ENCOUNTER — Ambulatory Visit (INDEPENDENT_AMBULATORY_CARE_PROVIDER_SITE_OTHER): Payer: 59 | Admitting: Surgery

## 2010-11-16 VITALS — BP 138/86 | HR 56 | Wt 147.4 lb

## 2010-11-16 DIAGNOSIS — Z4651 Encounter for fitting and adjustment of gastric lap band: Secondary | ICD-10-CM

## 2010-11-16 DIAGNOSIS — Z9884 Bariatric surgery status: Secondary | ICD-10-CM

## 2010-11-16 NOTE — Progress Notes (Signed)
[name] is doing well and she is in the green zone. She wants to lose a little more weight. She felt she needed to fill. Added 0.25 cc to her pain.  Last year she did have some issues with coughing and it may have been b and was overfilled. I advised her about salt retention now that can make a band tighter. I'll see her back in 6 weeks unless she still doing well and she may push back in 3 months. Overall she looks great.

## 2010-12-14 LAB — BASIC METABOLIC PANEL
CO2: 30
GFR calc Af Amer: >60
GFR calc non Af Amer: >60
Glucose, Bld: 93

## 2010-12-14 LAB — URINALYSIS, ROUTINE W REFLEX MICROSCOPIC
Bilirubin Urine: NEGATIVE
Nitrite: NEGATIVE
Specific Gravity, Urine: 1.027
Urobilinogen, UA: 0.2
pH: 5.5

## 2010-12-14 LAB — CBC
Platelets: 258
RBC: 3.81 — ABNORMAL LOW
WBC: 16.1 — ABNORMAL HIGH

## 2010-12-14 LAB — HEMOGLOBIN AND HEMATOCRIT, BLOOD
HCT: 33.5 — ABNORMAL LOW
Hemoglobin: 11.1 — ABNORMAL LOW

## 2010-12-14 LAB — DIFFERENTIAL
Eosinophils Absolute: 0
Lymphocytes Relative: 16
Lymphs Abs: 2.5
Monocytes Relative: 5
Neutro Abs: 12.7 — ABNORMAL HIGH
Neutrophils Relative %: 79 — ABNORMAL HIGH

## 2010-12-14 LAB — PREGNANCY, URINE: Preg Test, Ur: NEGATIVE

## 2011-01-05 ENCOUNTER — Encounter (INDEPENDENT_AMBULATORY_CARE_PROVIDER_SITE_OTHER): Payer: 59 | Admitting: Surgery

## 2011-03-15 ENCOUNTER — Encounter (HOSPITAL_COMMUNITY): Payer: Self-pay | Admitting: *Deleted

## 2011-03-15 ENCOUNTER — Emergency Department (HOSPITAL_COMMUNITY)
Admission: EM | Admit: 2011-03-15 | Discharge: 2011-03-16 | Disposition: A | Discharge status: Home / Self Care | Payer: No Typology Code available for payment source | Attending: Emergency Medicine | Admitting: Emergency Medicine

## 2011-03-15 DIAGNOSIS — M25569 Pain in unspecified knee: Secondary | ICD-10-CM | POA: Insufficient documentation

## 2011-03-15 DIAGNOSIS — E119 Type 2 diabetes mellitus without complications: Secondary | ICD-10-CM | POA: Insufficient documentation

## 2011-03-15 DIAGNOSIS — I10 Essential (primary) hypertension: Secondary | ICD-10-CM | POA: Insufficient documentation

## 2011-03-15 DIAGNOSIS — S161XXA Strain of muscle, fascia and tendon at neck level, initial encounter: Secondary | ICD-10-CM

## 2011-03-15 DIAGNOSIS — M542 Cervicalgia: Secondary | ICD-10-CM | POA: Insufficient documentation

## 2011-03-15 DIAGNOSIS — S139XXA Sprain of joints and ligaments of unspecified parts of neck, initial encounter: Secondary | ICD-10-CM | POA: Insufficient documentation

## 2011-03-15 DIAGNOSIS — T148XXA Other injury of unspecified body region, initial encounter: Secondary | ICD-10-CM | POA: Insufficient documentation
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
## 2011-03-15 NOTE — ED Notes (Signed)
Pt ambulated from waiting room to room with no difficulty. ROM in neck intact. ROM with knee intact; no swelling or abnormality except for minor skin tear over left knee cap. Pt reports pain in left side of neck and shoulder area. Pt reports neck feels "stiff", not tender upon palpation.

## 2011-03-16 MED ORDER — DIAZEPAM 5 MG PO TABS
5.0000 mg | ORAL_TABLET | Freq: Once | ORAL | Status: AC
  Administered 2011-03-16: 5 mg via ORAL
  Filled 2011-03-16: qty 1

## 2011-03-16 MED ORDER — HYDROCODONE-ACETAMINOPHEN 7.5-500 MG/15ML PO SOLN: 15.0000 mL | Freq: Once | ORAL | Status: DC

## 2011-03-16 MED ORDER — HYDROCODONE-ACETAMINOPHEN 7.5-325 MG/15ML PO SOLN: 15.0000 mL | Freq: Four times a day (QID) | ORAL | Status: AC | PRN

## 2011-03-16 MED ORDER — IBUPROFEN 100 MG/5ML PO SUSP
400.0000 mg | Freq: Once | ORAL | Status: AC
  Administered 2011-03-16: 400 mg via ORAL
  Filled 2011-03-16: qty 5
  Filled 2011-03-16: qty 15
  Filled 2011-03-16 (×2): qty 5

## 2011-03-16 MED ORDER — DIAZEPAM 5 MG PO TABS: 5.0000 mg | ORAL_TABLET | Freq: Two times a day (BID) | ORAL | Status: AC
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
## 2011-03-16 NOTE — ED Provider Notes (Signed)
Medical screening examination/treatment/procedure(s) were performed by non-physician practitioner and as supervising physician I was immediately available for consultation/collaboration.    Cornelious Bartolucci R Sherrol Vicars, MD 03/16/11 0727 

## 2011-03-16 NOTE — ED Notes (Signed)
Pt ambulated with a steady gait; VSS; no acute signs of distress; respirations even and unlabored; skin warm and dry.

## 2011-04-13 ENCOUNTER — Encounter (HOSPITAL_COMMUNITY): Payer: Self-pay | Admitting: *Deleted

## 2011-04-13 ENCOUNTER — Emergency Department (HOSPITAL_COMMUNITY)
Admission: EM | Admit: 2011-04-13 | Discharge: 2011-04-13 | Disposition: A | Discharge status: Home / Self Care | Payer: 59 | Source: Home / Self Care | Attending: Emergency Medicine | Admitting: Emergency Medicine

## 2011-04-13 DIAGNOSIS — S139XXA Sprain of joints and ligaments of unspecified parts of neck, initial encounter: Secondary | ICD-10-CM

## 2011-04-13 DIAGNOSIS — S161XXA Strain of muscle, fascia and tendon at neck level, initial encounter: Secondary | ICD-10-CM

## 2011-04-13 DIAGNOSIS — S335XXA Sprain of ligaments of lumbar spine, initial encounter: Secondary | ICD-10-CM

## 2011-04-13 DIAGNOSIS — S39012A Strain of muscle, fascia and tendon of lower back, initial encounter: Secondary | ICD-10-CM

## 2011-04-13 MED ORDER — MELOXICAM 15 MG PO TABS: 15.0000 mg | ORAL_TABLET | Freq: Every day | ORAL | Status: DC
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
## 2011-06-07 ENCOUNTER — Other Ambulatory Visit (HOSPITAL_COMMUNITY): Payer: Self-pay | Admitting: Obstetrics and Gynecology

## 2011-06-07 DIAGNOSIS — Z1231 Encounter for screening mammogram for malignant neoplasm of breast: Secondary | ICD-10-CM

## 2011-06-29 ENCOUNTER — Encounter (INDEPENDENT_AMBULATORY_CARE_PROVIDER_SITE_OTHER): Payer: Self-pay | Admitting: Surgery

## 2011-06-29 ENCOUNTER — Ambulatory Visit (INDEPENDENT_AMBULATORY_CARE_PROVIDER_SITE_OTHER): Payer: 59 | Admitting: Surgery

## 2011-06-29 VITALS — BP 122/78 | HR 84 | Temp 97.1°F | Resp 16 | Ht 62.0 in | Wt 123.4 lb

## 2011-06-29 DIAGNOSIS — Z4651 Encounter for fitting and adjustment of gastric lap band: Secondary | ICD-10-CM | POA: Insufficient documentation

## 2011-06-29 NOTE — Progress Notes (Signed)
Stacey Riddle Stacey Riddle Body mass index is 22.57 kg/(m^2).  Having regurgitation:  yes  Nocturnal reflux?  No  Amount of fill  -0.1

## 2011-06-30 ENCOUNTER — Other Ambulatory Visit: Payer: Self-pay

## 2011-06-30 ENCOUNTER — Encounter (HOSPITAL_COMMUNITY): Payer: Self-pay | Admitting: Family Medicine

## 2011-06-30 ENCOUNTER — Emergency Department (HOSPITAL_COMMUNITY): Payer: 59

## 2011-06-30 ENCOUNTER — Emergency Department (HOSPITAL_COMMUNITY)
Admission: EM | Admit: 2011-06-30 | Discharge: 2011-06-30 | Disposition: A | Discharge status: Home / Self Care | Payer: 59 | Attending: Emergency Medicine | Admitting: Emergency Medicine

## 2011-06-30 DIAGNOSIS — I10 Essential (primary) hypertension: Secondary | ICD-10-CM | POA: Insufficient documentation

## 2011-06-30 DIAGNOSIS — E876 Hypokalemia: Secondary | ICD-10-CM | POA: Insufficient documentation

## 2011-06-30 DIAGNOSIS — R0789 Other chest pain: Secondary | ICD-10-CM | POA: Insufficient documentation

## 2011-06-30 DIAGNOSIS — R079 Chest pain, unspecified: Secondary | ICD-10-CM | POA: Insufficient documentation

## 2011-06-30 DIAGNOSIS — R51 Headache: Secondary | ICD-10-CM | POA: Insufficient documentation

## 2011-06-30 DIAGNOSIS — E119 Type 2 diabetes mellitus without complications: Secondary | ICD-10-CM | POA: Insufficient documentation

## 2011-06-30 LAB — CBC
HCT: 34.6 % — ABNORMAL LOW (ref 36.0–46.0)
Hemoglobin: 11.8 g/dL — ABNORMAL LOW (ref 12.0–15.0)
MCH: 27.6 pg (ref 26.0–34.0)
MCV: 81 fL (ref 78.0–100.0)
Platelets: 236 10*3/uL (ref 150–400)
RBC: 4.27 MIL/uL (ref 3.87–5.11)
WBC: 9.8 10*3/uL (ref 4.0–10.5)

## 2011-06-30 LAB — POCT I-STAT, CHEM 8
BUN: 6 mg/dL (ref 6–23)
Calcium, Ion: 1.21 mmol/L (ref 1.12–1.32)
Chloride: 102 mEq/L (ref 96–112)
Creatinine, Ser: 0.7 mg/dL (ref 0.50–1.10)

## 2011-06-30 MED ORDER — POTASSIUM CHLORIDE 20 MEQ/15ML (10%) PO LIQD
40.0000 meq | Freq: Once | ORAL | Status: AC
  Administered 2011-06-30: 40 meq via ORAL
  Filled 2011-06-30: qty 30

## 2011-06-30 NOTE — ED Provider Notes (Signed)
History     CSN: [id number]  Arrival date & time 06/30/11  1411   First MD Initiated Contact with Patient 06/30/11 1825      Chief Complaint  Patient presents with  . Chest Pain  . Headache    (Consider location/radiation/quality/duration/timing/severity/associated sxs/prior treatment) HPI Complains of right-sided anterior chest pain for the past 3 weeks pain is intermittent longest episode lasting 3 minutes, nonradiating sharp in quality. No associated shortness of breath nausea or sweatiness. Pain is not made worse with exertion. Nothing makes pain better or worse. Patient also complains of right-sided headache for the past 3 weeks similar to headaches she's had for several years. Had a head CT scan may 2012 which was normal. Patient also reports nausea and vomiting for several years since her lap band surgery. She was treated yesterday by Dr. Daphine Deutscher with a loosening of her lap band. She's had no nausea or vomiting today. Patient presently asymptomatic. Cardiac risk factors include diabetes hypertension and family history Past Medical History  Diagnosis Date  . Shortness of breath   . Hypertension   . Bronchitis   . Anemia   . Diabetes mellitus     Past Surgical History  Procedure Date  . Bariatric surgery   . Carpal tunnel release   . Cesarean section     Family History  Problem Relation Age of Onset  . Cancer Mother   . Hyperlipidemia Mother   . Osteoporosis Mother   . Heart disease Father   . Diabetes Father   . Hypertension Father     History  Substance Use Topics  . Smoking status: Never Smoker   . Smokeless tobacco: Not on file  . Alcohol Use: No    OB History    Grav Para Term Preterm Abortions TAB SAB Ect Mult Living                  Review of Systems  Constitutional: Negative.   Respiratory: Positive for chest tightness.   Cardiovascular: Negative.   Gastrointestinal: Negative.   Musculoskeletal: Negative.   Skin: Negative.   Neurological:  Positive for headaches.  Hematological: Negative.   Psychiatric/Behavioral: Negative.     Allergies  Clarithromycin  Home Medications   Current Outpatient Rx  Name Route Sig Dispense Refill  . GLIMEPIRIDE 1 MG PO TABS Oral Take 4 mg by mouth 2 (two) times daily.    Marland Kitchen HYDROCODONE-ACETAMINOPHEN 7.5-500 MG PO TABS Oral Take 1 tablet by mouth every 6 (six) hours as needed. For pain relief    . LORATADINE 10 MG PO TABS Oral Take 10 mg by mouth daily.    Marland Kitchen LOSARTAN POTASSIUM-HCTZ 100-25 MG PO TABS Oral Take 1 tablet by mouth at bedtime.     Marland Kitchen POLYVINYL ALCOHOL 1.4 % OP SOLN Both Eyes Place 1 drop into both eyes as needed. Dry eyes       BP 154/89  Pulse 71  Temp(Src) 98.7 F (37.1 C) (Oral)  Resp 18  SpO2 100%  Physical Exam  Nursing note and vitals reviewed. Constitutional: She appears well-developed and well-nourished.  HENT:  Head: Normocephalic and atraumatic.  Eyes: Conjunctivae are normal. Pupils are equal, round, and reactive to light.  Neck: Neck supple. No tracheal deviation present. No thyromegaly present.  Cardiovascular: Normal rate and regular rhythm.   No murmur heard. Pulmonary/Chest: Effort normal and breath sounds normal.  Abdominal: Soft. Bowel sounds are normal. She exhibits no distension. There is no tenderness.  Musculoskeletal: Normal range of  motion. She exhibits no edema and no tenderness.  Neurological: She is alert. Coordination normal.  Skin: Skin is warm and dry. No rash noted.  Psychiatric: She has a normal mood and affect.   Date: 06/30/2011  Rate: 70  Rhythm: normal sinus rhythm  QRS Axis: normal  Intervals: normal  ST/T Wave abnormalities: nonspecific T wave changes  Conduction Disutrbances:none  Narrative Interpretation:   Old EKG Reviewed: unchanged  No significabnt change from 09/30/08  Results for orders placed during the hospital encounter of 06/30/11  CBC      Component Value Range   WBC 9.8  4.0 - 10.5 (K/uL)   RBC 4.27  3.87 -  5.11 (MIL/uL)   Hemoglobin 11.8 (*) 12.0 - 15.0 (g/dL)   HCT 16.1 (*) 09.6 - 46.0 (%)   MCV 81.0  78.0 - 100.0 (fL)   MCH 27.6  26.0 - 34.0 (pg)   MCHC 34.1  30.0 - 36.0 (g/dL)   RDW 04.5  40.9 - 81.1 (%)   Platelets 236  150 - 400 (K/uL)  POCT I-STAT, CHEM 8      Component Value Range   Sodium 145  135 - 145 (mEq/L)   Potassium 2.6 (*) 3.5 - 5.1 (mEq/L)   Chloride 102  96 - 112 (mEq/L)   BUN 6  6 - 23 (mg/dL)   Creatinine, Ser 9.14  0.50 - 1.10 (mg/dL)   Glucose, Bld 98  70 - 99 (mg/dL)   Calcium, Ion 7.82  9.56 - 1.32 (mmol/L)   TCO2 29  0 - 100 (mmol/L)   Hemoglobin 12.6  12.0 - 15.0 (g/dL)   HCT 21.3  08.6 - 57.8 (%)   Comment NOTIFIED PHYSICIAN    POCT I-STAT TROPONIN I      Component Value Range   Troponin i, poc 0.00  0.00 - 0.08 (ng/mL)   Comment 3            Dg Chest 2 View  06/30/2011  *RADIOLOGY REPORT*  Clinical Data: Chest pain, headache  CHEST - 2 VIEW  Comparison: Chest x-ray of 04/20/2009  Findings: The lungs are clear.  Mediastinal contours appear stable. The heart is within normal limits in size.  A lap band is noted unchanged in position by plain film, being slightly horizontal.  No bony abnormality is seen.  IMPRESSION: No active lung disease.  Lap band unchanged in position, which is somewhat horizontal.  Original Report Authenticated By: Juline Patch, M.D.     ED Course  Procedures (including critical care time)  Labs Reviewed - No data to display No results found. 8:30 PM patient remains asymptomatic  No diagnosis found.    MDM  Chest pain highly atypical for acute coronary syndrome but given patient's cardiac risk factors needs outpatient workup spoke with Dr.Nahser who will see patient in office next week Diagnosis #1 atypical chest pain #2 nonspecific headache #3 hypokalemia        Doug Sou, MD 06/30/11 2034

## 2011-06-30 NOTE — ED Notes (Signed)
Pt reports intermittent right-sided chest pain with headache x3 weeks. Denies any other sx. NAD

## 2011-06-30 NOTE — Discharge Instructions (Signed)
Chest Pain (Nonspecific) Chest pain has many causes. Your pain could be caused by something serious, such as a heart attack or a blood clot in the lungs. It could also be caused by something less serious, such as a chest bruise or a virus. Follow up with your doctor. More lab tests or other studies may be needed to find the cause of your pain. Most of the time, nonspecific chest pain will improve within 2 to 3 days of rest and mild pain medicine. HOME CARE  For chest bruises, you may put ice on the sore area for 15 to 20 minutes, 3 to 4 times a day. Do this only if it makes you or your child feel better.   Put ice in a plastic bag.   Place a towel between the skin and the bag.   Rest for the next 2 to 3 days.   Go back to work if the pain improves.   See your doctor if the pain lasts longer than 1 to 2 weeks.   Only take medicine as told by your doctor.   Quit smoking if you smoke.  GET HELP RIGHT AWAY IF:   There is more pain or pain that spreads to the arm, neck, jaw, back, or belly (abdomen).   You or your child has shortness of breath.   You or your child coughs more than usual or coughs up blood.   You or your child has very bad back or belly pain, feels sick to his or her stomach (nauseous), or throws up (vomits).   You or your child has very bad weakness.   You or your child passes out (faints).   You or your child has a temperature by mouth above 102 F (38.9 C), not controlled by medicine.  Any of these problems may be serious and may be an emergency. Do not wait to see if the problems will go away. Get medical help right away. Call your local emergency services 911 in [place] Do not drive yourself to the hospital. MAKE SURE YOU:   Understand these instructions.   Will watch this condition.   Will get help right away if you or your child is not doing well or gets worse.  Document Released: 08/30/2007 Document Revised: 03/02/2011 Document Reviewed:  08/30/2007 [facility] Patient Information 2012 [place], [place].   If you don't hear from Dr.Nahser's office by afternoon of Monday, 07/03/2011, call to schedule an appointment. Tell office staff that you were seen here. Ask Dr. Elease Hashimoto or hit staff to recheck your serum potassium

## 2011-06-30 NOTE — ED Notes (Signed)
Notified EDP, Jacubowitz, MD of low potassium on I-Stat device.

## 2011-06-30 NOTE — ED Notes (Signed)
Hx of lap band surgery 4 years ago and has been throwing up regularly since. Thinks that chest pain may be caused by that, had lap band loosened yesterday. Has been losing hair, hasn't seen nutritionist for a couple years.

## 2011-07-17 ENCOUNTER — Ambulatory Visit (HOSPITAL_COMMUNITY)
Admission: RE | Admit: 2011-07-17 | Discharge: 2011-07-17 | Disposition: A | Discharge status: Home / Self Care | Payer: 59 | Source: Ambulatory Visit | Attending: Obstetrics and Gynecology | Admitting: Obstetrics and Gynecology

## 2011-07-17 DIAGNOSIS — Z1231 Encounter for screening mammogram for malignant neoplasm of breast: Secondary | ICD-10-CM | POA: Insufficient documentation

## 2011-07-26 ENCOUNTER — Encounter: Payer: Self-pay | Admitting: Cardiovascular Disease

## 2011-07-26 ENCOUNTER — Ambulatory Visit (INDEPENDENT_AMBULATORY_CARE_PROVIDER_SITE_OTHER): Payer: 59 | Admitting: Cardiovascular Disease

## 2011-07-26 DIAGNOSIS — R079 Chest pain, unspecified: Secondary | ICD-10-CM | POA: Insufficient documentation

## 2011-07-26 DIAGNOSIS — I10 Essential (primary) hypertension: Secondary | ICD-10-CM

## 2011-07-26 NOTE — Assessment & Plan Note (Signed)
Her blood pressure is a little bit elevated today. She did not take her medication today. She had a potassium level of 2.4 when she was seen in the emergency room several weeks ago. I suspect that she'll need a potassium supplement. She has a problem swallowing large tablets since she's had let and surgery. We'll need to call her in [employer] 25 meq a day if she needs a supplement.

## 2011-07-26 NOTE — Progress Notes (Signed)
    Stacey Riddle Date of Birth  1965/10/03 [facility]     [name] [facility]  [address]    [address]   [address] [place], [place]  [address]    [place], [place]  [address] [phone]  Fax  [phone]  [phone]  Fax [phone]  Problem list: 1. Chest pain-negative workup in the emergency room 2. History Lap band surgery - January 2009 3. Hypertension 4. Diabetes Mellitus   History of Present Illness:  She presents for follow up of an episode of chest pain.  Not associated with exercise, eating, drinking, change of position, taking a deep breath.  The pain lasted several days.  Troponin level in the ER was negative.  Current Outpatient Prescriptions on File Prior to Visit  Medication Sig Dispense Refill  . glimepiride (AMARYL) 1 MG tablet Take 4 mg by mouth 2 (two) times daily.      Marland Kitchen HYDROcodone-acetaminophen (LORTAB) 7.5-500 MG per tablet Take 1 tablet by mouth every 6 (six) hours as needed. For pain relief      . loratadine (CLARITIN) 10 MG tablet Take 10 mg by mouth daily.      Marland Kitchen losartan-hydrochlorothiazide (HYZAAR) 100-25 MG per tablet Take 1 tablet by mouth at bedtime.         Allergies  Allergen Reactions  . Clarithromycin     Stomach cramps    Past Medical History  Diagnosis Date  . Shortness of breath   . Hypertension   . Bronchitis   . Anemia   . Diabetes mellitus     Past Surgical History  Procedure Date  . Bariatric surgery   . Carpal tunnel release   . Cesarean section     History  Smoking status  . Never Smoker   Smokeless tobacco  . Not on file    History  Alcohol Use No    Family History  Problem Relation Age of Onset  . Cancer Mother   . Hyperlipidemia Mother   . Osteoporosis Mother   . Heart disease Father   . Diabetes Father   . Hypertension Father     Reviw of Systems:  Reviewed in the HPI.  All other systems are negative.  Physical Exam: Blood pressure 153/92, pulse 75, height 5' 2.5"  (1.588 m), weight 131 lb 12.8 oz (59.784 kg). General: Well developed, well nourished, in no acute distress.  Head: Normocephalic, atraumatic, sclera non-icteric, mucus membranes are moist,   Neck: Supple. Carotids are 2 + without bruits. No JVD  Lungs: Clear bilaterally to auscultation.  Heart: regular rate.  normal  S1 S2. No murmurs, gallops or rubs.  Abdomen: Soft, non-tender, non-distended with normal bowel sounds. No hepatomegaly. No rebound/guarding. No masses.  Msk:  Strength and tone are normal  Extremities: No clubbing or cyanosis. No edema.  Distal pedal pulses are 2+ and equal bilaterally.  Neuro: Alert and oriented X 3. Moves all extremities spontaneously.  Psych:  Responds to questions appropriately with a normal affect.  ECG:  IN ER.  NSR. No acute changes.  Assessment / Plan:

## 2011-07-26 NOTE — Assessment & Plan Note (Signed)
Stacey Riddle presents with episodes of chest pain. The chest pain occurred for several days. Despite this she had negative troponin levels.  I doubt that this is cardiac pain. She does have a history of diabetes mellitus but this improved dramatically when she lost a lot of weight.  I don't the patient needs a stress test at this time but I would have a low threshold to schedule her for a stress test if she develops exertional chest pain. We'll have her walk a regular basis.

## 2011-07-26 NOTE — Patient Instructions (Signed)
Your physician wants you to follow-up in: 6 MONTHS  You will receive a reminder letter in the mail two months in advance. If you don't receive a letter, please call our office to schedule the follow-up appointment.  Your physician recommends that you return for lab work in: TODAY/ BMP  DR WANTS YOU TO WALK EVERYDAY,   Exercise to Stay Healthy Exercise helps you become and stay healthy. EXERCISE IDEAS AND TIPS Choose exercises that:  You enjoy.   Fit into your day.  You do not need to exercise really hard to be healthy. You can do exercises at a slow or medium level and stay healthy. You can:  Stretch before and after working out.   Try yoga, Pilates, or tai chi.   Lift weights.   Walk fast, swim, jog, run, climb stairs, bicycle, dance, or rollerskate.   Take aerobic classes.  Exercises that burn about 150 calories:  Running 1  miles in 15 minutes.   Playing volleyball for 45 to 60 minutes.   Washing and waxing a car for 45 to 60 minutes.   Playing touch football for 45 minutes.   Walking 1  miles in 35 minutes.   Pushing a stroller 1  miles in 30 minutes.   Playing basketball for 30 minutes.   Raking leaves for 30 minutes.   Bicycling 5 miles in 30 minutes.   Walking 2 miles in 30 minutes.   Dancing for 30 minutes.   Shoveling snow for 15 minutes.   Swimming laps for 20 minutes.   Walking up stairs for 15 minutes.   Bicycling 4 miles in 15 minutes.   Gardening for 30 to 45 minutes.   Jumping rope for 15 minutes.   Washing windows or floors for 45 to 60 minutes.  Document Released: 04/15/2010 Document Revised: 03/02/2011 Document Reviewed: 04/15/2010 [facility] Patient Information 2012 [place], [place].

## 2011-07-27 ENCOUNTER — Other Ambulatory Visit: Payer: Self-pay | Admitting: Cardiovascular Disease

## 2011-07-27 ENCOUNTER — Telehealth: Payer: Self-pay | Admitting: *Deleted

## 2011-07-27 DIAGNOSIS — E876 Hypokalemia: Secondary | ICD-10-CM

## 2011-07-27 LAB — BASIC METABOLIC PANEL
Chloride: 106 mEq/L (ref 96–112)
GFR: 149.34 mL/min (ref >60.00)
Glucose, Bld: 98 mg/dL (ref 70–99)
Potassium: 2.8 mEq/L — CL (ref 3.5–5.1)
Sodium: 140 mEq/L (ref 135–145)

## 2011-07-27 MED ORDER — POT BICARB-POT CHLORIDE 25 MEQ PO TBEF: 1.0000 | EFFERVESCENT_TABLET | Freq: Every day | ORAL | Status: DC

## 2011-07-27 NOTE — Telephone Encounter (Signed)
Critical K+ 2.8, results given to Dr Elease Hashimoto. ORDER FOLLOWED FOR KLYTE SINCE PT CANT SWALLOW LARGE PILLS, PT INFORMED AND F/U LAB SCHEDULED, EXPLAINED IMPORTANCE OF POTASSIUM AND RISK OF ARRYTHMIA'S, PT WILL TRY TO PICK MED UP TODAY.

## 2011-08-03 ENCOUNTER — Telehealth: Payer: Self-pay | Admitting: Cardiovascular Disease

## 2011-08-03 MED ORDER — POTASSIUM CHLORIDE CRYS ER 20 MEQ PO TBCR: 20.0000 meq | EXTENDED_RELEASE_TABLET | Freq: Every day | ORAL | Status: DC

## 2011-08-03 NOTE — Telephone Encounter (Signed)
Pt called stating she has not started taking k+ supplement yet, she said the pharmacy is trying to say she can dissolve kdur in water and it will be less expensive. Pt can not swallow the large k+ tablets so the effervesent potassium was ordered. I called the pharmacy and she said the prices were 16.00 vs 36.00 and it will take a couple days to get the eff K+, pt will dissolve the kdur instead at this time. Dr Elease Hashimoto

## 2011-08-03 NOTE — Telephone Encounter (Signed)
Done see note

## 2011-08-03 NOTE — Telephone Encounter (Signed)
Pt was precribed effer k 25 mg [facility] can change to klor con it's cheaper, pt requesting call [phone]

## 2011-08-10 ENCOUNTER — Other Ambulatory Visit: Payer: 59

## 2011-08-16 ENCOUNTER — Other Ambulatory Visit (INDEPENDENT_AMBULATORY_CARE_PROVIDER_SITE_OTHER): Payer: 59

## 2011-08-16 ENCOUNTER — Other Ambulatory Visit: Payer: Self-pay

## 2011-08-16 ENCOUNTER — Other Ambulatory Visit: Payer: Self-pay | Admitting: *Deleted

## 2011-08-16 DIAGNOSIS — I119 Hypertensive heart disease without heart failure: Secondary | ICD-10-CM

## 2011-08-16 DIAGNOSIS — I10 Essential (primary) hypertension: Secondary | ICD-10-CM

## 2011-08-16 DIAGNOSIS — E876 Hypokalemia: Secondary | ICD-10-CM

## 2011-08-16 LAB — BASIC METABOLIC PANEL
CO2: 27 mEq/L (ref 19–32)
Chloride: 108 mEq/L (ref 96–112)
Creatinine, Ser: 0.6 mg/dL (ref 0.4–1.2)
Glucose, Bld: 129 mg/dL — ABNORMAL HIGH (ref 70–99)
Sodium: 142 mEq/L (ref 135–145)

## 2011-08-16 MED ORDER — LOSARTAN POTASSIUM-HCTZ 100-25 MG PO TABS: 1.0000 | ORAL_TABLET | Freq: Every day | ORAL | Status: DC

## 2011-08-23 ENCOUNTER — Other Ambulatory Visit (INDEPENDENT_AMBULATORY_CARE_PROVIDER_SITE_OTHER): Payer: 59

## 2011-08-23 ENCOUNTER — Telehealth: Payer: Self-pay | Admitting: *Deleted

## 2011-08-23 DIAGNOSIS — E876 Hypokalemia: Secondary | ICD-10-CM

## 2011-08-23 DIAGNOSIS — I10 Essential (primary) hypertension: Secondary | ICD-10-CM

## 2011-08-23 LAB — BASIC METABOLIC PANEL
BUN: 8 mg/dL (ref 6–23)
Chloride: 108 mEq/L (ref 96–112)
Creatinine, Ser: 0.7 mg/dL (ref 0.4–1.2)
Glucose, Bld: 119 mg/dL — ABNORMAL HIGH (ref 70–99)
Potassium: 3.3 mEq/L — ABNORMAL LOW (ref 3.5–5.1)

## 2011-08-23 MED ORDER — POTASSIUM CHLORIDE CRYS ER 20 MEQ PO TBCR: 20.0000 meq | EXTENDED_RELEASE_TABLET | Freq: Two times a day (BID) | ORAL | Status: DC

## 2011-08-23 NOTE — Telephone Encounter (Signed)
kdur increased to bid, lab redraw set for 2 weeks.

## 2011-08-23 NOTE — Telephone Encounter (Signed)
Message copied by Antony Odea on Wed Aug 23, 2011  3:30 PM ------      Message from: Vesta Mixer      Created: Wed Aug 23, 2011  3:26 PM       Increase kdur to 20 bid

## 2011-09-06 ENCOUNTER — Other Ambulatory Visit (INDEPENDENT_AMBULATORY_CARE_PROVIDER_SITE_OTHER): Payer: 59

## 2011-09-06 DIAGNOSIS — E876 Hypokalemia: Secondary | ICD-10-CM

## 2011-09-06 LAB — BASIC METABOLIC PANEL
BUN: 7 mg/dL (ref 6–23)
CO2: 26 mEq/L (ref 19–32)
Glucose, Bld: 130 mg/dL — ABNORMAL HIGH (ref 70–99)
Potassium: 3.7 mEq/L (ref 3.5–5.1)
Sodium: 139 mEq/L (ref 135–145)

## 2012-03-08 ENCOUNTER — Ambulatory Visit (INDEPENDENT_AMBULATORY_CARE_PROVIDER_SITE_OTHER): Payer: 59 | Admitting: Surgery

## 2012-03-08 ENCOUNTER — Encounter (INDEPENDENT_AMBULATORY_CARE_PROVIDER_SITE_OTHER): Payer: Self-pay | Admitting: Surgery

## 2012-03-08 VITALS — BP 128/86 | HR 72 | Temp 98.6°F | Resp 14 | Ht 62.0 in | Wt 137.2 lb

## 2012-03-08 DIAGNOSIS — Z9884 Bariatric surgery status: Secondary | ICD-10-CM

## 2012-03-08 NOTE — Patient Instructions (Signed)
Thanks for your patience.  If you need further assistance after leaving the office, please call our office and speak with a [facility] nurse.  [phone].  If you want to leave a message for Dr. Jae Bruck, please call his office phone at [phone]. 

## 2012-03-08 NOTE — Progress Notes (Signed)
Bjorn Loser D Chrisp Doyle Body mass index is 25.09 kg/(m^2).  Having regurgitation:  occasional  Nocturnal reflux?  no  Amount of fill  -0.1 Still feels a little too tight. When and took out 0.1 cc from her band.I will see her again in 2 months and see how she is doing. She also may check in with [employer] to help her and her maintenance. She is today about 73 pounds down from her preop weight. Impression doing well after lap and with hiatal hernia repair

## 2012-05-23 ENCOUNTER — Telehealth (INDEPENDENT_AMBULATORY_CARE_PROVIDER_SITE_OTHER): Payer: Self-pay

## 2012-05-23 NOTE — Telephone Encounter (Signed)
Called pt to let her know that the appt she has scheduled for Fri May 11 @9a  is the first available for her to see Dr. Daphine Deutscher for a lap band fill.  Pt seemed a little disappointed and asked if I could call her if there were any cancellations.  I said that I would.

## 2012-06-06 ENCOUNTER — Other Ambulatory Visit (INDEPENDENT_AMBULATORY_CARE_PROVIDER_SITE_OTHER): Payer: Self-pay | Admitting: General Surgery

## 2012-06-06 ENCOUNTER — Ambulatory Visit (INDEPENDENT_AMBULATORY_CARE_PROVIDER_SITE_OTHER): Payer: 59 | Admitting: Physician Assistant

## 2012-06-06 ENCOUNTER — Encounter (INDEPENDENT_AMBULATORY_CARE_PROVIDER_SITE_OTHER): Payer: Self-pay

## 2012-06-06 VITALS — BP 132/88 | HR 84 | Temp 97.0°F | Resp 16 | Ht 62.0 in | Wt 158.8 lb

## 2012-06-06 DIAGNOSIS — Z4651 Encounter for fitting and adjustment of gastric lap band: Secondary | ICD-10-CM

## 2012-06-06 NOTE — Patient Instructions (Signed)
Take clear liquids tonight. Thin protein shakes are ok to start tomorrow morning. Slowly advance your diet thereafter. Call us if you have persistent vomiting or regurgitation, night cough or reflux symptoms. Return as scheduled or sooner if you notice no changes in hunger/portion sizes.  

## 2012-06-06 NOTE — Progress Notes (Signed)
  HISTORY: Stacey Riddle is a 47 y.o.female who received an AP-Standard lap-band in January 2009 by Dr. Daphine Deutscher. She comes in after seeing Dr. Daphine Deutscher in December when he removed 0.1 mL. She has since gained 22 lbs but has no further regurgitation. We talked extensively about food choices and what she's capable of tolerating. It appears she's eating more food and is making some unhelpful food choices. She has not seen a nutritionist in the past several years.  VITAL SIGNS: Filed Vitals:   06/06/12 1451  BP: 132/88  Pulse: 84  Temp: 97 F (36.1 C)  Resp: 16    PHYSICAL EXAM: Physical exam reveals a very well-appearing 47 y.o.female in no apparent distress Neurologic: Awake, alert, oriented Psych: Bright affect, conversant Respiratory: Breathing even and unlabored. No stridor or wheezing Abdomen: Soft, nontender, nondistended to palpation. Incisions well-healed. No incisional hernias. Port easily palpated. Extremities: Atraumatic, good range of motion.  ASSESMENT: 47 y.o.  female  s/p AP-Standard lap-band.   PLAN: The patient's port was accessed with a 20G Huber needle without difficulty. Clear fluid was aspirated and 0.1 mL saline was added to the port. The patient was able to swallow water without difficulty following the procedure and was instructed to take clear liquids for the next 24-48 hours and advance slowly as tolerated. We'll set her up with Huntley Dec in Nutrition.

## 2012-06-20 ENCOUNTER — Other Ambulatory Visit (HOSPITAL_COMMUNITY): Payer: Self-pay | Admitting: Obstetrics and Gynecology

## 2012-06-20 DIAGNOSIS — Z1231 Encounter for screening mammogram for malignant neoplasm of breast: Secondary | ICD-10-CM

## 2012-06-27 ENCOUNTER — Encounter: Payer: Self-pay | Admitting: *Deleted

## 2012-06-27 ENCOUNTER — Encounter: Payer: 59 | Attending: Surgery | Admitting: *Deleted

## 2012-06-27 DIAGNOSIS — Z09 Encounter for follow-up examination after completed treatment for conditions other than malignant neoplasm: Secondary | ICD-10-CM | POA: Insufficient documentation

## 2012-06-27 DIAGNOSIS — Z9884 Bariatric surgery status: Secondary | ICD-10-CM | POA: Insufficient documentation

## 2012-06-27 DIAGNOSIS — Z713 Dietary counseling and surveillance: Secondary | ICD-10-CM | POA: Insufficient documentation

## 2012-06-27 NOTE — Progress Notes (Addendum)
  Follow-up visit:  5 Years Post-Operative LAGB Surgery  Medical Nutrition Therapy:  Appt start time: 1145  End time:  1245.  Primary concerns today: Post-operative Bariatric Surgery Nutrition Management. Stacey Riddle returns to [facility] after 3 yr hiatus with a 33 lb wt gain. Missing upper and lower teeth on left side. Reports she snacks all day on cheetos and potato chips because they "go down well".  Unable to chew other food well enough and subsequently regurgitates.  States she may start pureeing foods.  Reports drinking 1 liter of regular soda daily. Discussed how this causes weight gain and elevates blood glucose levels.  No exercise noted.   Surgery date: 04/09/07 Surgery type:  LAGB Start weight @ [facility]:  212.3 lbs (12/18/06) Weight hx: 124.8 lbs (06/15/09); 137 lbs (02/25/12)  Weight today: 158 lbs Weight change:  33.2 lb (GAIN) Total weight lost:  54.3 lbs  Goal weight: 140-145 lbs % goal met:  75-81%  TANITA  BODY COMP RESULTS  06/27/12   BMI (kg/m^2) 28.9   Fat Mass (lbs) 65.0   Fat Free Mass (lbs) 93.0   Total Body Water (lbs) 68.0   24-hour Food Recall:  B:  Cheese & crackers OR 1 egg (6-8g) S:  Cheetos/potato chips L:  3 oz fried chicken (20-25g) S:  Cheetos/potato chips  D:  3 oz "some kind of protein" (20-25g) S:  Soda (1 liter a day)  Fluid intake: < 64 oz Estimated total protein intake: 45-60g  Medications:  Reviewed with patient Supplementation:  Taking  Using straws: No Drinking while eating:  Unknown Hair loss: No Carbonated beverages: Yes; 1 liter regular soda daily N/V/D/C: Constipation reported Last Lap-Band fill:  06/06/12 - is in the green/yellow zone. Appt set with Mardelle Matte for another fill 08/02/12  Recent physical activity:  None at this time   Progress Towards Goal(s):  In progress.  Handouts given during visit include:  Specialized Post-Op Diet  Samples given during visit include:   Unjury Protein Powder - 1 pkt Lot: [id number]; Exp: 06/15  Premier  Protein Shake: 1 bottle Lot: [id number]; Exp: 02/01/13   Nutritional Diagnosis:  [place]-3.4 Unintentional weight gain related to poor food choices s/p lapband surgery as evidenced by patient reported food recall and 33 lb weight gain.    Intervention:  Nutrition education.  Monitoring/Evaluation:  Dietary intake, exercise, lap band fills, and body weight. Follow up in 2 months.

## 2012-06-27 NOTE — Patient Instructions (Addendum)
Goals:  Eat 3-6 small meals/snacks, every 3-5 hrs  Increase lean protein foods to meet 60-80g goal  Try Quest bars  Increase fluid intake to 64oz +  Add 15 grams of carbohydrate (fruit, whole grain) with meals  Split sodas with diet version  Try romaine lettuce or spinach for help with constipation  Avoid drinking 15 minutes before, during and 30 minutes after eating  Aim for >30 min of physical activity daily  Resume vitamin supplementation (see handout). Make sure you have one with iron or supplement iron.

## 2012-07-17 ENCOUNTER — Ambulatory Visit (HOSPITAL_COMMUNITY)
Admission: RE | Admit: 2012-07-17 | Discharge: 2012-07-17 | Disposition: A | Discharge status: Home / Self Care | Payer: 59 | Source: Ambulatory Visit | Attending: Obstetrics and Gynecology | Admitting: Obstetrics and Gynecology

## 2012-07-17 DIAGNOSIS — Z1231 Encounter for screening mammogram for malignant neoplasm of breast: Secondary | ICD-10-CM

## 2012-07-21 ENCOUNTER — Emergency Department (HOSPITAL_COMMUNITY): Payer: 59

## 2012-07-21 ENCOUNTER — Encounter (HOSPITAL_COMMUNITY): Payer: Self-pay | Admitting: Emergency Medicine

## 2012-07-21 ENCOUNTER — Emergency Department (HOSPITAL_COMMUNITY)
Admission: EM | Admit: 2012-07-21 | Discharge: 2012-07-21 | Disposition: A | Discharge status: Home / Self Care | Payer: 59 | Attending: Emergency Medicine | Admitting: Emergency Medicine

## 2012-07-21 DIAGNOSIS — E876 Hypokalemia: Secondary | ICD-10-CM | POA: Insufficient documentation

## 2012-07-21 DIAGNOSIS — K5900 Constipation, unspecified: Secondary | ICD-10-CM | POA: Insufficient documentation

## 2012-07-21 DIAGNOSIS — Z79899 Other long term (current) drug therapy: Secondary | ICD-10-CM | POA: Insufficient documentation

## 2012-07-21 DIAGNOSIS — I10 Essential (primary) hypertension: Secondary | ICD-10-CM | POA: Insufficient documentation

## 2012-07-21 DIAGNOSIS — Z862 Personal history of diseases of the blood and blood-forming organs and certain disorders involving the immune mechanism: Secondary | ICD-10-CM | POA: Insufficient documentation

## 2012-07-21 DIAGNOSIS — R109 Unspecified abdominal pain: Secondary | ICD-10-CM | POA: Insufficient documentation

## 2012-07-21 DIAGNOSIS — D219 Benign neoplasm of connective and other soft tissue, unspecified: Secondary | ICD-10-CM

## 2012-07-21 DIAGNOSIS — E119 Type 2 diabetes mellitus without complications: Secondary | ICD-10-CM | POA: Insufficient documentation

## 2012-07-21 DIAGNOSIS — Z9884 Bariatric surgery status: Secondary | ICD-10-CM | POA: Insufficient documentation

## 2012-07-21 DIAGNOSIS — Z8709 Personal history of other diseases of the respiratory system: Secondary | ICD-10-CM | POA: Insufficient documentation

## 2012-07-21 LAB — CBC
Hemoglobin: 11.5 g/dL — ABNORMAL LOW (ref 12.0–15.0)
RBC: 4.29 MIL/uL (ref 3.87–5.11)
WBC: 12.2 10*3/uL — ABNORMAL HIGH (ref 4.0–10.5)

## 2012-07-21 LAB — BASIC METABOLIC PANEL
CO2: 29 mEq/L (ref 19–32)
Chloride: 99 mEq/L (ref 96–112)
Glucose, Bld: 250 mg/dL — ABNORMAL HIGH (ref 70–99)
Potassium: 2.4 mEq/L — CL (ref 3.5–5.1)
Sodium: 137 mEq/L (ref 135–145)

## 2012-07-21 MED ORDER — POTASSIUM CHLORIDE 10 MEQ/100ML IV SOLN
10.0000 meq | Freq: Once | INTRAVENOUS | Status: AC
  Administered 2012-07-21: 10 meq via INTRAVENOUS
  Filled 2012-07-21: qty 100

## 2012-07-21 MED ORDER — IOHEXOL 300 MG/ML  SOLN
50.0000 mL | Freq: Once | INTRAMUSCULAR | Status: AC | PRN
  Administered 2012-07-21: 25 mL via ORAL

## 2012-07-21 MED ORDER — POTASSIUM CHLORIDE CRYS ER 20 MEQ PO TBCR
40.0000 meq | EXTENDED_RELEASE_TABLET | Freq: Once | ORAL | Status: AC
  Administered 2012-07-21: 40 meq via ORAL
  Filled 2012-07-21: qty 2

## 2012-07-21 MED ORDER — POTASSIUM CHLORIDE CRYS ER 20 MEQ PO TBCR: 20.0000 meq | EXTENDED_RELEASE_TABLET | Freq: Two times a day (BID) | ORAL | Status: DC

## 2012-07-21 MED ORDER — MORPHINE SULFATE 4 MG/ML IJ SOLN
6.0000 mg | Freq: Once | INTRAMUSCULAR | Status: DC
  Filled 2012-07-21: qty 2

## 2012-07-21 MED ORDER — IOHEXOL 300 MG/ML  SOLN
100.0000 mL | Freq: Once | INTRAMUSCULAR | Status: AC | PRN
  Administered 2012-07-21: 100 mL via INTRAVENOUS

## 2012-07-21 NOTE — ED Provider Notes (Signed)
History     CSN: [id number]  Arrival date & time 07/21/12  1056   First MD Initiated Contact with Patient 07/21/12 1326      Chief Complaint  Patient presents with  . Abdominal Pain  . Constipation     The history is provided by the patient.   the patient reports 10 days of worsening left-sided abdominal pain.  She has been constipated some over the past week but states normal bowel movement yesterday.  No nausea vomiting or diarrhea.  No fevers or chills.  No urinary symptoms.  She reports her pain is worse with movement and palpation.  She has a prior LAP-BAND but reports this feels much different than her LAP-BAND issues before in the past.  Her pain is more localized to her left lower quadrant.  No prior history of diverticulitis.  Symptoms are mild to moderate in severity.  Nothing improves her pain  Past Medical History  Diagnosis Date  . Shortness of breath   . Hypertension   . Bronchitis   . Anemia   . Diabetes mellitus     Past Surgical History  Procedure Laterality Date  . Bariatric surgery    . Carpal tunnel release    . Cesarean section      Family History  Problem Relation Age of Onset  . Cancer Mother   . Hyperlipidemia Mother   . Osteoporosis Mother   . Heart disease Father   . Diabetes Father   . Hypertension Father     History  Substance Use Topics  . Smoking status: Never Smoker   . Smokeless tobacco: Not on file  . Alcohol Use: No    OB History   Grav Para Term Preterm Abortions TAB SAB Ect Mult Living                  Review of Systems  Gastrointestinal: Positive for abdominal pain and constipation.  All other systems reviewed and are negative.    Allergies  Clarithromycin  Home Medications   Current Outpatient Rx  Name  Route  Sig  Dispense  Refill  . Aspirin-Acetaminophen-Caffeine (GOODY HEADACHE PO)   Oral   Take 1-2 packets by mouth daily as needed (for pain).         Marland Kitchen glimepiride (AMARYL) 4 MG tablet   Oral   Take  4 mg by mouth 2 (two) times daily.         Marland Kitchen loratadine (CLARITIN) 10 MG tablet   Oral   Take 10 mg by mouth daily as needed for allergies.          Marland Kitchen losartan-hydrochlorothiazide (HYZAAR) 100-25 MG per tablet   Oral   Take 1 tablet by mouth daily.         . Multiple Vitamin (MULTIVITAMIN WITH MINERALS) TABS   Oral   Take 1 tablet by mouth daily.         . potassium bicarbonate (K-LYTE) 25 MEQ disintegrating tablet   Oral   Take 50 mEq by mouth daily.           BP 130/82  Pulse 89  Temp(Src) 98.1 F (36.7 C) (Oral)  SpO2 99%  Physical Exam  Nursing note and vitals reviewed. Constitutional: She is oriented to person, place, and time. She appears well-developed and well-nourished. No distress.  HENT:  Head: Normocephalic and atraumatic.  Eyes: EOM are normal.  Neck: Normal range of motion.  Cardiovascular: Normal rate, regular rhythm and normal heart sounds.  Pulmonary/Chest: Effort normal and breath sounds normal.  Abdominal: Soft. She exhibits no distension.  Left-sided abdominal tenderness without guarding or rebound  Musculoskeletal: Normal range of motion.  Neurological: She is alert and oriented to person, place, and time.  Skin: Skin is warm and dry.  Psychiatric: She has a normal mood and affect. Judgment normal.    ED Course  Procedures (including critical care time)  Labs Reviewed  CBC - Abnormal; Notable for the following:    WBC 12.2 (*)    Hemoglobin 11.5 (*)    HCT 33.5 (*)    All other components within normal limits  BASIC METABOLIC PANEL - Abnormal; Notable for the following:    Potassium 2.4 (*)    Glucose, Bld 250 (*)    All other components within normal limits   No results found.   No diagnosis found.    MDM  10 days of worsening left-sided abdominal pain with some tenderness on exam.  CT scan pending.  The patient is noted to be hypokalemic.  Her potassium will be replaced in the emergency department.          Lyanne Co, MD 07/21/12 1535

## 2012-07-21 NOTE — ED Notes (Signed)
Patient presents with epigastric pain and constipation.  Patient denies N/V/D or fevers.

## 2012-07-21 NOTE — ED Notes (Signed)
Pt had 2009 lapband. Band loosened 4 months ago. Has not contacted surgeon or PCP about recent pain

## 2012-07-21 NOTE — ED Provider Notes (Signed)
Pt signed out to me by Dr. Patria Mane at end of shift,  CT scan ordered to evaluate for diverticulitis.  CT scan shows mass- c/w fibroid or ovarian mass.  US obtained which has appearance more c/w fibroid.  However, radiology has recommended nonemergent MRI to help to evaluate this area further.  Results d/w patient.  Will also give gynecology referral.   Ethelda Chick, MD 07/21/12 [phone]

## 2012-07-21 NOTE — ED Notes (Signed)
[id number]<AV> Expected date:<BR> Expected time:<BR> Means of arrival:<BR> Comments:<BR> triage

## 2012-07-23 ENCOUNTER — Encounter: Payer: Self-pay | Admitting: *Deleted

## 2012-08-02 ENCOUNTER — Ambulatory Visit (INDEPENDENT_AMBULATORY_CARE_PROVIDER_SITE_OTHER): Payer: 59 | Admitting: Surgery

## 2012-08-02 ENCOUNTER — Encounter (INDEPENDENT_AMBULATORY_CARE_PROVIDER_SITE_OTHER): Payer: Self-pay | Admitting: Surgery

## 2012-08-02 VITALS — BP 120/82 | HR 72 | Temp 99.3°F | Resp 16 | Ht 62.0 in | Wt 156.6 lb

## 2012-08-02 DIAGNOSIS — Z4651 Encounter for fitting and adjustment of gastric lap band: Secondary | ICD-10-CM

## 2012-08-02 NOTE — Patient Instructions (Signed)

## 2012-08-02 NOTE — Progress Notes (Signed)
Lapband Fill Encounter Problem List:   Patient Active Problem List   Diagnosis Date Noted  . Lapband APS + [facility] repair Jan 2009 03/08/2012  . Chest pain 07/26/2011  . Fitting and adjustment of gastric lap band 06/29/2011  . URI 05/26/2009  . DM 05/14/2009  . ALLERGIC RHINITIS 05/14/2009  . COUGH, CHRONIC 05/14/2009  . POLYCYSTIC OVARIAN DISEASE 05/13/2009  . HYPERTENSION 05/13/2009    Stacey Riddle Body mass index is 28.64 kg/(m^2). Weight loss since surgery  53  Having regurgitation?:  No  Feel that they need a fill?  yes  Nocturnal reflux?  No  Amount of fill  0.15   Port site: Easily accessed. We discussed weight loss and management of diet to maintain that weight loss. She would like to lose at least 10 more pounds and maintain that. I added 0.15 cc to her band to hopefully give her the right amount of restriction. She'll be followed up by Mardelle Matte or  me in 6 weeks  Instructions given and weight loss goals discussed.    Matt B. Daphine Deutscher, MD, FACS

## 2012-08-28 ENCOUNTER — Encounter: Payer: 59 | Attending: Surgery | Admitting: *Deleted

## 2012-08-28 DIAGNOSIS — Z9884 Bariatric surgery status: Secondary | ICD-10-CM | POA: Insufficient documentation

## 2012-08-28 DIAGNOSIS — Z09 Encounter for follow-up examination after completed treatment for conditions other than malignant neoplasm: Secondary | ICD-10-CM | POA: Insufficient documentation

## 2012-08-28 DIAGNOSIS — Z713 Dietary counseling and surveillance: Secondary | ICD-10-CM | POA: Insufficient documentation

## 2012-08-28 NOTE — Patient Instructions (Addendum)
Goals:  Eat 3-6 small meals/snacks, every 3-5 hrs  Increase lean protein foods to meet 60-80g goal  Try Quest bars  Increase fluid intake to 64oz +  Add 15 grams of carbohydrate (fruit, whole grain) with meals  Split sodas with diet version  Try romaine lettuce or spinach for help with constipation  Avoid drinking 15 minutes before, during and 30 minutes after eating  Aim for >30 min of physical activity daily

## 2012-08-28 NOTE — Progress Notes (Signed)
  Follow-up visit:  5 Years Post-Operative LAGB Surgery  Medical Nutrition Therapy:  Appt start time:  330  End time:  400.  Primary concerns today: Post-operative Bariatric Surgery Nutrition Management. Renarda returns for f/u with a 6 lb wt loss (all FAT MASS) from last visit. Has decreased soda intake to 12 oz/day (from 1 L/day), though still eating some high CHO foods.  Also reports constipation. Treating with miralax and organic laxative tea. Reports FBGs around 118 mg (from 140-160 mg).  Surgery date: 04/09/07 Surgery type:  LAGB Start weight @ [facility]:  212.3 lbs (12/18/06) Weight hx: 124.8 lbs (06/15/09); 137 lbs (02/25/12)  Weight today: 152.0 lbs Weight change:  6.0 lbs Total weight lost:  60.3 lbs  Goal weight: 140-145 lbs % goal met:  83-90%  TANITA  BODY COMP RESULTS  06/27/12 08/28/12   BMI (kg/m^2) 28.9 27.8   Fat Mass (lbs) 65.0 58.5   Fat Free Mass (lbs) 93.0 93.5   Total Body Water (lbs) 68.0 68.5   24-hour Food Recall:  B:  1 egg (scrambled) w/ 1 oz chs - 15g S:  4-5 Hershey's Nuggets (30g CHO) L:  3 oz fried chicken (20-25g) S:  Cheetos/potato chips  D:  3 oz "some kind of protein" (20-25g) S:  Soda (12 oz/day)  Fluid intake: 16 oz x 3-4 of sweetened drinks:  48-64 oz Estimated total protein intake: 45-60g  Medications:  No changes reported Supplementation:  Taking  Using straws: No Drinking while eating:  Unknown Hair loss: No Carbonated beverages: Yes; decreased to 12 oz/day (from 1 L/day) N/V/D/C: Constipation reported. Taking miralax and drinking an organic laxative tea Last Lap-Band fill:  08/02/12 - is in the green zone; closer to yellow border.   Recent physical activity:  None at this time   Progress Towards Goal(s):  In progress.   Nutritional Diagnosis:  [place]-3.4 Unintentional weight gain related to poor food choices s/p lapband surgery as evidenced by patient reported food recall and 33 lb weight gain.    Intervention:  Nutrition  education.  Monitoring/Evaluation:  Dietary intake, exercise, lap band fills, and body weight. Follow up in 2 months.

## 2012-09-12 ENCOUNTER — Encounter (INDEPENDENT_AMBULATORY_CARE_PROVIDER_SITE_OTHER): Payer: 59

## 2012-10-28 ENCOUNTER — Ambulatory Visit: Payer: 59 | Admitting: *Deleted

## 2015-03-28 ENCOUNTER — Encounter (HOSPITAL_COMMUNITY): Payer: Self-pay | Admitting: Family Medicine

## 2015-03-28 ENCOUNTER — Emergency Department (HOSPITAL_COMMUNITY)
Admission: EM | Admit: 2015-03-28 | Discharge: 2015-03-28 | Disposition: A | Discharge status: Home / Self Care | Payer: 59 | Attending: Emergency Medicine | Admitting: Emergency Medicine

## 2015-03-28 DIAGNOSIS — N390 Urinary tract infection, site not specified: Secondary | ICD-10-CM | POA: Diagnosis not present

## 2015-03-28 DIAGNOSIS — Z79899 Other long term (current) drug therapy: Secondary | ICD-10-CM | POA: Diagnosis not present

## 2015-03-28 DIAGNOSIS — E119 Type 2 diabetes mellitus without complications: Secondary | ICD-10-CM | POA: Diagnosis not present

## 2015-03-28 DIAGNOSIS — R109 Unspecified abdominal pain: Secondary | ICD-10-CM | POA: Diagnosis present

## 2015-03-28 DIAGNOSIS — I10 Essential (primary) hypertension: Secondary | ICD-10-CM | POA: Diagnosis not present

## 2015-03-28 DIAGNOSIS — Z862 Personal history of diseases of the blood and blood-forming organs and certain disorders involving the immune mechanism: Secondary | ICD-10-CM | POA: Diagnosis not present

## 2015-03-28 DIAGNOSIS — Z8709 Personal history of other diseases of the respiratory system: Secondary | ICD-10-CM | POA: Diagnosis not present

## 2015-03-28 LAB — URINALYSIS, ROUTINE W REFLEX MICROSCOPIC
GLUCOSE, UA: 100 mg/dL — AB
KETONES UR: 15 mg/dL — AB
Nitrite: NEGATIVE
PH: 6 (ref 5.0–8.0)
Protein, ur: 100 mg/dL — AB
Specific Gravity, Urine: 1.037 — ABNORMAL HIGH (ref 1.005–1.030)

## 2015-03-28 LAB — URINE MICROSCOPIC-ADD ON

## 2015-03-28 MED ORDER — CEPHALEXIN 500 MG PO CAPS: 500.0000 mg | ORAL_CAPSULE | Freq: Three times a day (TID) | ORAL | Status: DC

## 2015-03-28 MED ORDER — CEPHALEXIN 500 MG PO CAPS
500.0000 mg | ORAL_CAPSULE | Freq: Once | ORAL | Status: AC
  Administered 2015-03-28: 500 mg via ORAL
  Filled 2015-03-28: qty 1

## 2015-03-28 MED ORDER — IBUPROFEN 800 MG PO TABS: 800.0000 mg | ORAL_TABLET | Freq: Three times a day (TID) | ORAL | Status: DC

## 2015-03-28 MED ORDER — KETOROLAC TROMETHAMINE 60 MG/2ML IM SOLN
60.0000 mg | Freq: Once | INTRAMUSCULAR | Status: AC
  Administered 2015-03-28: 60 mg via INTRAMUSCULAR
  Filled 2015-03-28: qty 2

## 2015-03-28 NOTE — Discharge Instructions (Signed)

## 2015-03-28 NOTE — ED Provider Notes (Signed)
CSN: [id number]     Arrival date & time 03/28/15  2021 History   First MD Initiated Contact with Patient 03/28/15 2226     Chief Complaint  Patient presents with  . Abdominal Pain     (Consider location/radiation/quality/duration/timing/severity/associated sxs/prior Treatment) HPI  The patient is a 50 year old female, she has a history of urinary infection the past and presents with recurrent urinary tract symptoms including dysuria, frequency, small volumes of frequent urination and a small amount of left lower back pain. She has no fevers chills nausea or vomiting. She has no recent antibiotic use, she has mild left lower quadrant pain. No swelling. Symptoms are persistent over the last 2 days.  Past Medical History  Diagnosis Date  . Shortness of breath   . Hypertension   . Bronchitis   . Anemia   . Diabetes mellitus   . Morbid obesity [facility])    Past Surgical History  Procedure Laterality Date  . Bariatric surgery    . Carpal tunnel release    . Cesarean section     Family History  Problem Relation Age of Onset  . Cancer Mother   . Hyperlipidemia Mother   . Osteoporosis Mother   . Heart disease Father   . Diabetes Father   . Hypertension Father    Social History  Substance Use Topics  . Smoking status: Never Smoker   . Smokeless tobacco: None  . Alcohol Use: Yes     Comment: Once a month   OB History    No data available     Review of Systems  All other systems reviewed and are negative.     Allergies  Clarithromycin  Home Medications   Prior to Admission medications   Medication Sig Start Date End Date Taking? Authorizing Provider  Aspirin-Acetaminophen-Caffeine (GOODY HEADACHE PO) Take 1-2 packets by mouth daily as needed (for pain).    Historical Provider, MD  cephALEXin (KEFLEX) 500 MG capsule Take 1 capsule (500 mg total) by mouth 3 (three) times daily. 03/28/15   Noemi Chapel, MD  glimepiride (AMARYL) 4 MG tablet Take 4 mg by mouth 2 (two) times  daily.    Historical Provider, MD  ibuprofen (ADVIL,MOTRIN) 800 MG tablet Take 1 tablet (800 mg total) by mouth 3 (three) times daily. 03/28/15   Noemi Chapel, MD  loratadine (CLARITIN) 10 MG tablet Take 10 mg by mouth daily as needed for allergies.     Historical Provider, MD  losartan-hydrochlorothiazide (HYZAAR) 100-25 MG per tablet Take 1 tablet by mouth daily.    Historical Provider, MD  Multiple Vitamin (MULTIVITAMIN WITH MINERALS) TABS Take 1 tablet by mouth daily.    Historical Provider, MD  polyethylene glycol (MIRALAX / GLYCOLAX) packet Take 17 g by mouth every other day. 08/28/12   Johnathan Hausen, MD  potassium chloride SA (K-DUR,KLOR-CON) 20 MEQ tablet Take 1 tablet (20 mEq total) by mouth 2 (two) times daily. 07/21/12   Alfonzo Beers, MD  UNABLE TO FIND Take 1 each by mouth every other day. Med Name: [facility] TEA    Historical Provider, MD   BP 140/93 mmHg  Pulse 86  Temp(Src) 98.6 F (37 C) (Oral)  Resp 20  Ht 5\' 2"  (1.575 m)  Wt 135 lb (61.236 kg)  BMI 24.69 kg/m2  SpO2 100% Physical Exam  Constitutional: She appears well-developed and well-nourished. No distress.  HENT:  Head: Normocephalic and atraumatic.  Mouth/Throat: Oropharynx is clear and moist. No oropharyngeal exudate.  Eyes: Conjunctivae and  EOM are normal. Pupils are equal, round, and reactive to light. Right eye exhibits no discharge. Left eye exhibits no discharge. No scleral icterus.  Neck: Normal range of motion. Neck supple. No JVD present. No thyromegaly present.  Cardiovascular: Normal rate, regular rhythm, normal heart sounds and intact distal pulses.  Exam reveals no gallop and no friction rub.   No murmur heard. Pulmonary/Chest: Effort normal and breath sounds normal. No respiratory distress. She has no wheezes. She has no rales.  Abdominal: Soft. Bowel sounds are normal. She exhibits no distension and no mass. There is no tenderness.  Musculoskeletal: Normal range of motion. She exhibits no  edema or tenderness.  Lymphadenopathy:    She has no cervical adenopathy.  Neurological: She is alert. Coordination normal.  Skin: Skin is warm and dry. No rash noted. No erythema.  Psychiatric: She has a normal mood and affect. Her behavior is normal.  Nursing note and vitals reviewed.   ED Course  Procedures (including critical care time) Labs Review Labs Reviewed  URINALYSIS, ROUTINE W REFLEX MICROSCOPIC (NOT AT [facility]) - Abnormal; Notable for the following:    Color, Urine AMBER (*)    APPearance CLOUDY (*)    Specific Gravity, Urine 1.037 (*)    Glucose, UA 100 (*)    Hgb urine dipstick SMALL (*)    Bilirubin Urine SMALL (*)    Ketones, ur 15 (*)    Protein, ur 100 (*)    Leukocytes, UA TRACE (*)    All other components within normal limits  URINE MICROSCOPIC-ADD ON - Abnormal; Notable for the following:    Squamous Epithelial / LPF 0-5 (*)    Bacteria, UA MANY (*)    All other components within normal limits    Imaging Review No results found. I have personally reviewed and evaluated these images and lab results as part of my medical decision-making.   MDM   Final diagnoses:  UTI (lower urinary tract infection)    The patient has no abdominal tenderness, no CVA tenderness, her urinalysis is consistent with a urinary tract infection. I will culture this, started on antibiotics and she has requested pain medicine, anti-inflammatory medications ordered. The patient overall appears stable for discharge, without abdominal tenderness I doubt diverticulitis or other surgical cause.  Meds given in ED:  Medications  ketorolac (TORADOL) injection 60 mg (not administered)  cephALEXin (KEFLEX) capsule 500 mg (not administered)    New Prescriptions   CEPHALEXIN (KEFLEX) 500 MG CAPSULE    Take 1 capsule (500 mg total) by mouth 3 (three) times daily.   IBUPROFEN (ADVIL,MOTRIN) 800 MG TABLET    Take 1 tablet (800 mg total) by mouth 3 (three) times daily.        Noemi Chapel, MD 03/28/15 2245

## 2015-03-28 NOTE — ED Notes (Signed)
Pt reports she is experiencing left sided abd pain with increase in frequency, urgency, and pain with urination. Denies blood in urine or fever.

## 2015-03-30 LAB — URINE CULTURE: CULTURE: NO GROWTH

## 2015-04-07 ENCOUNTER — Emergency Department (HOSPITAL_COMMUNITY)
Admission: EM | Admit: 2015-04-07 | Discharge: 2015-04-07 | Disposition: A | Discharge status: Home / Self Care | Payer: 59 | Attending: Emergency Medicine | Admitting: Emergency Medicine

## 2015-04-07 ENCOUNTER — Encounter (HOSPITAL_COMMUNITY): Payer: Self-pay | Admitting: Emergency Medicine

## 2015-04-07 DIAGNOSIS — Z862 Personal history of diseases of the blood and blood-forming organs and certain disorders involving the immune mechanism: Secondary | ICD-10-CM | POA: Diagnosis not present

## 2015-04-07 DIAGNOSIS — E876 Hypokalemia: Secondary | ICD-10-CM

## 2015-04-07 DIAGNOSIS — E119 Type 2 diabetes mellitus without complications: Secondary | ICD-10-CM | POA: Diagnosis not present

## 2015-04-07 DIAGNOSIS — Z8709 Personal history of other diseases of the respiratory system: Secondary | ICD-10-CM | POA: Insufficient documentation

## 2015-04-07 DIAGNOSIS — Z79899 Other long term (current) drug therapy: Secondary | ICD-10-CM | POA: Diagnosis not present

## 2015-04-07 DIAGNOSIS — R5383 Other fatigue: Secondary | ICD-10-CM | POA: Diagnosis not present

## 2015-04-07 DIAGNOSIS — Z87440 Personal history of urinary (tract) infections: Secondary | ICD-10-CM | POA: Insufficient documentation

## 2015-04-07 DIAGNOSIS — I10 Essential (primary) hypertension: Secondary | ICD-10-CM | POA: Diagnosis not present

## 2015-04-07 DIAGNOSIS — R7989 Other specified abnormal findings of blood chemistry: Secondary | ICD-10-CM | POA: Diagnosis present

## 2015-04-07 DIAGNOSIS — Z791 Long term (current) use of non-steroidal anti-inflammatories (NSAID): Secondary | ICD-10-CM | POA: Diagnosis not present

## 2015-04-07 LAB — BASIC METABOLIC PANEL
ANION GAP: 13 (ref 5–15)
BUN: 7 mg/dL (ref 6–20)
CO2: 23 mmol/L (ref 22–32)
Calcium: 8.7 mg/dL — ABNORMAL LOW (ref 8.9–10.3)
Chloride: 103 mmol/L (ref 101–111)
Creatinine, Ser: 0.95 mg/dL (ref 0.44–1.00)
GLUCOSE: 237 mg/dL — AB (ref 65–99)
POTASSIUM: 2.3 mmol/L — AB (ref 3.5–5.1)
SODIUM: 139 mmol/L (ref 135–145)

## 2015-04-07 LAB — CBC
HCT: 33.9 % — ABNORMAL LOW (ref 36.0–46.0)
Hemoglobin: 11.5 g/dL — ABNORMAL LOW (ref 12.0–15.0)
MCH: 26.9 pg (ref 26.0–34.0)
MCHC: 33.9 g/dL (ref 30.0–36.0)
MCV: 79.2 fL (ref 78.0–100.0)
PLATELETS: 162 10*3/uL (ref 150–400)
RBC: 4.28 MIL/uL (ref 3.87–5.11)
RDW: 13.8 % (ref 11.5–15.5)
WBC: 4.3 10*3/uL (ref 4.0–10.5)

## 2015-04-07 MED ORDER — POTASSIUM CHLORIDE CRYS ER 20 MEQ PO TBCR
40.0000 meq | EXTENDED_RELEASE_TABLET | Freq: Once | ORAL | Status: AC
  Administered 2015-04-07: 40 meq via ORAL
  Filled 2015-04-07 (×2): qty 2

## 2015-04-07 MED ORDER — POTASSIUM CHLORIDE 10 MEQ/100ML IV SOLN
10.0000 meq | INTRAVENOUS | Status: AC
  Administered 2015-04-07: 10 meq via INTRAVENOUS
  Filled 2015-04-07: qty 100

## 2015-04-07 NOTE — Discharge Instructions (Signed)
Hypokalemia °Hypokalemia means that the amount of potassium in the blood is lower than normal. Potassium is a chemical, called an electrolyte, that helps regulate the amount of fluid in the body. It also stimulates muscle contraction and helps nerves function properly. Most of the body's potassium is inside of cells, and only a very small amount is in the blood. Because the amount in the blood is so small, minor changes can be life-threatening. °CAUSES °· Antibiotics. °· Diarrhea or vomiting. °· Using laxatives too much, which can cause diarrhea. °· Chronic kidney disease. °· Water pills (diuretics). °· Eating disorders (bulimia). °· Low magnesium level. °· Sweating a lot. °SIGNS AND SYMPTOMS °· Weakness. °· Constipation. °· Fatigue. °· Muscle cramps. °· Mental confusion. °· Skipped heartbeats or irregular heartbeat (palpitations). °· Tingling or numbness. °DIAGNOSIS  °Your health care provider can diagnose hypokalemia with blood tests. In addition to checking your potassium level, your health care provider may also check other lab tests. °TREATMENT °Hypokalemia can be treated with potassium supplements taken by mouth or adjustments in your current medicines. If your potassium level is very low, you may need to get potassium through a vein (IV) and be monitored in the hospital. A diet high in potassium is also helpful. Foods high in potassium are: °· Nuts, such as peanuts and pistachios. °· Seeds, such as sunflower seeds and pumpkin seeds. °· Peas, lentils, and lima beans. °· Whole grain and bran cereals and breads. °· Fresh fruit and vegetables, such as apricots, avocado, bananas, cantaloupe, kiwi, oranges, tomatoes, asparagus, and potatoes. °· Orange and tomato juices. °· Red meats. °· Fruit yogurt. °HOME CARE INSTRUCTIONS °· Take all medicines as prescribed by your health care provider. °· Maintain a healthy diet by including nutritious food, such as fruits, vegetables, nuts, whole grains, and lean meats. °· If  you are taking a laxative, be sure to follow the directions on the label. °SEEK MEDICAL CARE IF: °· Your weakness gets worse. °· You feel your heart pounding or racing. °· You are vomiting or having diarrhea. °· You are diabetic and having trouble keeping your blood glucose in the normal range. °SEEK IMMEDIATE MEDICAL CARE IF: °· You have chest pain, shortness of breath, or dizziness. °· You are vomiting or having diarrhea for more than 2 days. °· You faint. °MAKE SURE YOU:  °· Understand these instructions. °· Will watch your condition. °· Will get help right away if you are not doing well or get worse. °  °This information is not intended to replace advice given to you by your health care provider. Make sure you discuss any questions you have with your health care provider. °  °Document Released: 03/13/2005 Document Revised: 04/03/2014 Document Reviewed: 09/13/2012 °Elsevier Interactive Patient Education ©2016 [employer] ° °Potassium Content of Foods °Potassium is a mineral found in many foods and drinks. It helps keep fluids and minerals balanced in your body and affects how steadily your heart beats. Potassium also helps control your blood pressure and keep your muscles and nervous system healthy. °Certain health conditions and medicines may change the balance of potassium in your body. When this happens, you can help balance your level of potassium through the foods that you do or do not eat. Your health care provider or dietitian may recommend an amount of potassium that you should have each day. The following lists of foods provide the amount of potassium (in parentheses) per serving in each item. °HIGH IN POTASSIUM  °The following foods and beverages have 200 mg   or more of potassium per serving: °· Apricots, 2 raw or 5 dry (200 mg). °· Artichoke, 1 medium (345 mg). °· Avocado, raw,  ¼ each (245 mg). °· Banana, 1 medium (425 mg). °· Beans, lima, or baked beans, canned, ½ cup (280 mg). °· Beans, white,  canned, ½ cup (595 mg). °· Beef roast, 3 oz (320 mg). °· Beef, ground, 3 oz (270 mg). °· Beets, raw or cooked, ½ cup (260 mg). °· Bran muffin, 2 oz (300 mg). °· Broccoli, ½ cup (230 mg). °· Brussels sprouts, ½ cup (250 mg). °· Cantaloupe, ½ cup (215 mg). °· Cereal, 100% bran, ½ cup (200-400 mg). °· Cheeseburger, single, fast food, 1 each (225-400 mg). °· Chicken, 3 oz (220 mg). °· Clams, canned, 3 oz (535 mg). °· Crab, 3 oz (225 mg). °· Dates, 5 each (270 mg). °· Dried beans and peas, ½ cup (300-475 mg). °· Figs, dried, 2 each (260 mg). °· Fish: halibut, tuna, cod, snapper, 3 oz (480 mg). °· Fish: salmon, haddock, swordfish, perch, 3 oz (300 mg). °· Fish, tuna, canned 3 oz (200 mg). °· French fries, fast food, 3 oz (470 mg). °· Granola with fruit and nuts, ½ cup (200 mg). °· Grapefruit juice, ½ cup (200 mg). °· Greens, beet, ½ cup (655 mg). °· Honeydew melon, ½ cup (200 mg). °· Kale, raw, 1 cup (300 mg). °· Kiwi, 1 medium (240 mg). °· Kohlrabi, rutabaga, parsnips, ½ cup (280 mg). °· Lentils, ½ cup (365 mg). °· Mango, 1 each (325 mg). °· Milk, chocolate, 1 cup (420 mg). °· Milk: nonfat, low-fat, whole, buttermilk, 1 cup (350-380 mg). °· Molasses, 1 Tbsp (295 mg). °· Mushrooms, ½ cup (280) mg. °· Nectarine, 1 each (275 mg). °· Nuts: almonds, peanuts, hazelnuts, [place], cashew, mixed, 1 oz (200 mg). °· Nuts, pistachios, 1 oz (295 mg). °· Orange, 1 each (240 mg). °· Orange juice, ½ cup (235 mg). °· Papaya, medium, ½ fruit (390 mg). °· Peanut butter, chunky, 2 Tbsp (240 mg). °· Peanut butter, smooth, 2 Tbsp (210 mg). °· Pear, 1 medium (200 mg). °· Pomegranate, 1 whole (400 mg). °· Pomegranate juice, ½ cup (215 mg). °· Pork, 3 oz (350 mg). °· Potato chips, salted, 1 oz (465 mg). °· Potato, baked with skin, 1 medium (925 mg). °· Potatoes, boiled, ½ cup (255 mg). °· Potatoes, mashed, ½ cup (330 mg). °· Prune juice, ½ cup (370 mg). °· Prunes, 5 each (305 mg). °· Pudding, chocolate, ½ cup (230 mg). °· Pumpkin, canned, ½ cup  (250 mg). °· Raisins, seedless, ¼ cup (270 mg). °· Seeds, sunflower or pumpkin, 1 oz (240 mg). °· Soy milk, 1 cup (300 mg). °· Spinach, ½ cup (420 mg). °· Spinach, canned, ½ cup (370 mg). °· Sweet potato, baked with skin, 1 medium (450 mg). °· Swiss chard, ½ cup (480 mg). °· Tomato or vegetable juice, ½ cup (275 mg). °· Tomato sauce or puree, ½ cup (400-550 mg). °· Tomato, raw, 1 medium (290 mg). °· Tomatoes, canned, ½ cup (200-300 mg). °· Turkey, 3 oz (250 mg). °· Wheat germ, 1 oz (250 mg). °· Winter squash, ½ cup (250 mg). °· Yogurt, plain or fruited, 6 oz (260-435 mg). °· Zucchini, ½ cup (220 mg). °MODERATE IN POTASSIUM °The following foods and beverages have 50-200 mg of potassium per serving: °· Apple, 1 each (150 mg). °· Apple juice, ½ cup (150 mg). °· Applesauce, ½ cup (90 mg). °· Apricot nectar, ½ cup (140 mg). °· Asparagus, small   spears, ½ cup or 6 spears (155 mg). °· Bagel, cinnamon raisin, 1 each (130 mg). °· Bagel, egg or plain, 4 in., 1 each (70 mg). °· Beans, green, ½ cup (90 mg). °· Beans, yellow, ½ cup (190 mg). °· Beer, regular, 12 oz (100 mg). °· Beets, canned, ½ cup (125 mg). °· Blackberries, ½ cup (115 mg). °· Blueberries, ½ cup (60 mg). °· Bread, whole wheat, 1 slice (70 mg). °· Broccoli, raw, ½ cup (145 mg). °· Cabbage, ½ cup (150 mg). °· Carrots, cooked or raw, ½ cup (180 mg). °· Cauliflower, raw, ½ cup (150 mg). °· Celery, raw, ½ cup (155 mg). °· Cereal, bran flakes, ½cup (120-150 mg). °· Cheese, cottage, ½ cup (110 mg). °· Cherries, 10 each (150 mg). °· Chocolate, 1½ oz bar (165 mg). °· Coffee, brewed 6 oz (90 mg). °· Corn, ½ cup or 1 ear (195 mg). °· Cucumbers, ½ cup (80 mg). °· Egg, large, 1 each (60 mg). °· Eggplant, ½ cup (60 mg). °· Endive, raw, ½cup (80 mg). °· English muffin, 1 each (65 mg). °· Fish, orange roughy, 3 oz (150 mg). °· Frankfurter, beef or pork, 1 each (75 mg). °· Fruit cocktail, ½ cup (115 mg). °· Grape juice, ½ cup (170 mg). °· Grapefruit, ½ fruit (175 mg). °· Grapes,  ½ cup (155 mg). °· Greens: kale, turnip, collard, ½ cup (110-150 mg). °· Ice cream or frozen yogurt, chocolate, ½ cup (175 mg). °· Ice cream or frozen yogurt, vanilla, ½ cup (120-150 mg). °· Lemons, limes, 1 each (80 mg). °· Lettuce, all types, 1 cup (100 mg). °· Mixed vegetables, ½ cup (150 mg). °· Mushrooms, raw, ½ cup (110 mg). °· Nuts: walnuts, pecans, or macadamia, 1 oz (125 mg). °· Oatmeal, ½ cup (80 mg). °· Okra, ½ cup (110 mg). °· Onions, raw, ½ cup (120 mg). °· Peach, 1 each (185 mg). °· Peaches, canned, ½ cup (120 mg). °· Pears, canned, ½ cup (120 mg). °· Peas, green, frozen, ½ cup (90 mg). °· Peppers, green, ½ cup (130 mg). °· Peppers, red, ½ cup (160 mg). °· Pineapple juice, ½ cup (165 mg). °· Pineapple, fresh or canned, ½ cup (100 mg). °· Plums, 1 each (105 mg). °· Pudding, vanilla, ½ cup (150 mg). °· Raspberries, ½ cup (90 mg). °· Rhubarb, ½ cup (115 mg). °· Rice, wild, ½ cup (80 mg). °· Shrimp, 3 oz (155 mg). °· Spinach, raw, 1 cup (170 mg). °· Strawberries, ½ cup (125 mg). °· Summer squash ½ cup (175-200 mg). °· Swiss chard, raw, 1 cup (135 mg). °· Tangerines, 1 each (140 mg). °· Tea, brewed, 6 oz (65 mg). °· Turnips, ½ cup (140 mg). °· Watermelon, ½ cup (85 mg). °· Wine, red, table, 5 oz (180 mg). °· Wine, white, table, 5 oz (100 mg). °LOW IN POTASSIUM °The following foods and beverages have less than 50 mg of potassium per serving. °· Bread, white, 1 slice (30 mg). °· Carbonated beverages, 12 oz (less than 5 mg). °· Cheese, 1 oz (20-30 mg). °· Cranberries, ½ cup (45 mg). °· Cranberry juice cocktail, ½ cup (20 mg). °· Fats and oils, 1 Tbsp (less than 5 mg). °· Hummus, 1 Tbsp (32 mg). °· Nectar: papaya, mango, or pear, ½ cup (35 mg). °· Rice, white or brown, ½ cup (50 mg). °· Spaghetti or macaroni, ½ cup cooked (30 mg). °· Tortilla, flour or corn, 1 each (50 mg). °· Waffle, 4 in., 1 each (50 mg). °· Water   chestnuts, ½ cup (40 mg). °  °This information is not intended to replace advice given to you by  your health care provider. Make sure you discuss any questions you have with your health care provider. °  °Document Released: 10/25/2004 Document Revised: 03/18/2013 Document Reviewed: 02/07/2013 °Elsevier Interactive Patient Education ©2016 Elsevier Inc. ° °

## 2015-04-07 NOTE — ED Provider Notes (Signed)
CSN: [id number]     Arrival date & time 04/07/15  1659 History   First MD Initiated Contact with Patient 04/07/15 1821     Chief Complaint  Patient presents with  . Abnormal Lab     (Consider location/radiation/quality/duration/timing/severity/associated sxs/prior Treatment) Patient is a 50 y.o. female presenting with general illness. The history is provided by the patient.  Illness Location:  Pt went to PCP today for routine lab check Severity:  Moderate Onset quality:  Gradual Timing:  Constant Chronicity:  Recurrent Context:  Pt has history of hypokalemia and it supposed to be on supplementation but is not taking Associated symptoms: fatigue   Associated symptoms: no abdominal pain, no chest pain, no congestion, no cough, no diarrhea, no ear pain, no fever, no headaches, no loss of consciousness, no myalgias, no nausea, no rash, no rhinorrhea, no shortness of breath, no sore throat, no vomiting and no wheezing     Past Medical History  Diagnosis Date  . Shortness of breath   . Hypertension   . Bronchitis   . Anemia   . Diabetes mellitus   . Morbid obesity [facility])    Past Surgical History  Procedure Laterality Date  . Bariatric surgery    . Carpal tunnel release    . Cesarean section     Family History  Problem Relation Age of Onset  . Cancer Mother   . Hyperlipidemia Mother   . Osteoporosis Mother   . Heart disease Father   . Diabetes Father   . Hypertension Father    Social History  Substance Use Topics  . Smoking status: Never Smoker   . Smokeless tobacco: None  . Alcohol Use: Yes     Comment: Once a month   OB History    No data available     Review of Systems  Constitutional: Positive for fatigue. Negative for fever and chills.  HENT: Negative for congestion, ear pain, rhinorrhea and sore throat.   Eyes: Negative for pain and visual disturbance.  Respiratory: Negative for cough, shortness of breath and wheezing.   Cardiovascular: Negative for chest  pain.  Gastrointestinal: Negative for nausea, vomiting, abdominal pain and diarrhea.  Genitourinary: Negative for dysuria.  Musculoskeletal: Negative for myalgias, back pain and gait problem.  Skin: Negative for rash.  Neurological: Negative for dizziness, loss of consciousness and headaches.  Psychiatric/Behavioral: Negative for confusion.      Allergies  Clarithromycin  Home Medications   Prior to Admission medications   Medication Sig Start Date End Date Taking? Authorizing Provider  Aspirin-Acetaminophen-Caffeine (GOODY HEADACHE PO) Take 1-2 packets by mouth daily as needed (for pain).    Historical Provider, MD  glimepiride (AMARYL) 4 MG tablet Take 4 mg by mouth 2 (two) times daily.    Historical Provider, MD  ibuprofen (ADVIL,MOTRIN) 800 MG tablet Take 1 tablet (800 mg total) by mouth 3 (three) times daily. 03/28/15   Noemi Chapel, MD  loratadine (CLARITIN) 10 MG tablet Take 10 mg by mouth daily as needed for allergies.     Historical Provider, MD  losartan-hydrochlorothiazide (HYZAAR) 100-25 MG per tablet Take 1 tablet by mouth daily.    Historical Provider, MD  Multiple Vitamin (MULTIVITAMIN WITH MINERALS) TABS Take 1 tablet by mouth daily.    Historical Provider, MD  polyethylene glycol (MIRALAX / GLYCOLAX) packet Take 17 g by mouth every other day. 08/28/12   Johnathan Hausen, MD  potassium chloride SA (K-DUR,KLOR-CON) 20 MEQ tablet Take 1 tablet (20 mEq total) by mouth  2 (two) times daily. 07/21/12   Alfonzo Beers, MD  UNABLE TO FIND Take 1 each by mouth every other day. Med Name: [facility] TEA    Historical Provider, MD   BP 116/105 mmHg  Pulse 72  Temp(Src) 97.5 F (36.4 C) (Oral)  Resp 15  Ht 5\' 5"  (1.651 m)  Wt 61.236 kg  BMI 22.47 kg/m2  SpO2 100% Physical Exam  Constitutional: She is oriented to person, place, and time. She appears well-developed and well-nourished. No distress.  HENT:  Head: Normocephalic and atraumatic.  Eyes: Conjunctivae and EOM are  normal. Pupils are equal, round, and reactive to light.  Neck: Normal range of motion.  Cardiovascular: Normal rate and regular rhythm.  Exam reveals no gallop and no friction rub.   No murmur heard. Pulmonary/Chest: Effort normal and breath sounds normal. No respiratory distress. She has no wheezes. She has no rales. She exhibits no tenderness.  Abdominal: Soft. Bowel sounds are normal. She exhibits no distension and no mass. There is no tenderness. There is no rebound and no guarding.  Musculoskeletal: Normal range of motion.  Neurological: She is alert and oriented to person, place, and time. No cranial nerve deficit.  Skin: Skin is warm and dry. No rash noted. She is not diaphoretic. No erythema.  Psychiatric: She has a normal mood and affect. Her behavior is normal.    ED Course  Procedures (including critical care time) Labs Review Labs Reviewed  CBC - Abnormal; Notable for the following:    Hemoglobin 11.5 (*)    HCT 33.9 (*)    All other components within normal limits  BASIC METABOLIC PANEL - Abnormal; Notable for the following:    Potassium 2.3 (*)    Glucose, Bld 237 (*)    Calcium 8.7 (*)    All other components within normal limits    Imaging Review No results found. I have personally reviewed and evaluated these images and lab results as part of my medical decision-making.   EKG Interpretation   Date/Time:  Wednesday April 07 2015 20:20:56 EST Ventricular Rate:  63 PR Interval:  138 QRS Duration: 81 QT Interval:  379 QTC Calculation: 388 R Axis:   34 Text Interpretation:  Sinus rhythm Baseline wander in lead(s) II III aVF  No significant change since last tracing Confirmed by KNAPP  MD-J, JON  [id number]) on 04/07/2015 8:43:32 PM      MDM   Final diagnoses:  Hypokalemia    50 year old black female with past medical history diabetes and hypo-came anemia presents in setting of low potassium. Patient reports she followed up with PCP today for first time in  2 years and had routine laboratory analysis performed. She was called this afternoon and PCP told her potassium was low and she should come to the emergency department. On arrival patient was hemodynamically stable and afebrile. Her only complaint is mild fatigue. She reports she had a UTI a week ago and has feeling better since that time but still has some slight fatigue. She denies any other symptoms at this time. She reports she only came to emergency department per her PCP recommendation. Patient reports she is prescribed potassium supplementation and has not been taking for approximate one year.  Laboratory analysis obtained which show potassium to be 2.3. No other significant abnormalities noted. In setting these findings EKG obtained which demonstrated no acute ischemia or QRS or QT prolongation. Patient given 10 mEq IV potassium 2 hours and 40 mEq of oral  potassium. Patient remained hemodynamically stable while in emergency department without complaints. She tolerated potassium without competitions. We'll discharge home at this time with plan to follow with PCP within one week for lab recheck. Advised patient to restart home potassium supplementation. Patient given strict return precautions and stable at time of discharge. Patient in agreement with plan.  Attending has seen and evaluated patient and Dr. Tomi Bamberger is in agreement with plan.    Esaw Grandchild, MD 04/07/15 2113  Dorie Rank, MD 04/07/15 2134

## 2015-04-07 NOTE — ED Notes (Signed)
Pt was seen at pcp today for blood draws and was called and told her potassium was low. Pt states she hasn't taken her potassium tablets in over a yr. Pt has no complaints.

## 2016-04-27 DIAGNOSIS — Z23 Encounter for immunization: Secondary | ICD-10-CM | POA: Diagnosis not present

## 2016-05-25 DIAGNOSIS — I10 Essential (primary) hypertension: Secondary | ICD-10-CM | POA: Diagnosis not present

## 2016-05-25 DIAGNOSIS — E538 Deficiency of other specified B group vitamins: Secondary | ICD-10-CM | POA: Diagnosis not present

## 2016-05-25 DIAGNOSIS — Z9884 Bariatric surgery status: Secondary | ICD-10-CM | POA: Diagnosis not present

## 2016-05-25 DIAGNOSIS — E1165 Type 2 diabetes mellitus with hyperglycemia: Secondary | ICD-10-CM | POA: Diagnosis not present

## 2016-05-25 DIAGNOSIS — R202 Paresthesia of skin: Secondary | ICD-10-CM | POA: Diagnosis not present

## 2016-05-25 DIAGNOSIS — E7800 Pure hypercholesterolemia, unspecified: Secondary | ICD-10-CM | POA: Diagnosis not present

## 2016-06-14 ENCOUNTER — Emergency Department (HOSPITAL_COMMUNITY)
Admission: EM | Admit: 2016-06-14 | Discharge: 2016-06-15 | Disposition: A | Discharge status: Home / Self Care | Payer: 59 | Attending: Emergency Medicine | Admitting: Emergency Medicine

## 2016-06-14 ENCOUNTER — Emergency Department (HOSPITAL_COMMUNITY): Payer: 59

## 2016-06-14 ENCOUNTER — Encounter (HOSPITAL_COMMUNITY): Payer: Self-pay | Admitting: Emergency Medicine

## 2016-06-14 DIAGNOSIS — G4452 New daily persistent headache (NDPH): Secondary | ICD-10-CM

## 2016-06-14 DIAGNOSIS — H359 Unspecified retinal disorder: Secondary | ICD-10-CM | POA: Insufficient documentation

## 2016-06-14 DIAGNOSIS — E119 Type 2 diabetes mellitus without complications: Secondary | ICD-10-CM | POA: Insufficient documentation

## 2016-06-14 DIAGNOSIS — Q141 Congenital malformation of retina: Secondary | ICD-10-CM

## 2016-06-14 DIAGNOSIS — R51 Headache: Secondary | ICD-10-CM | POA: Diagnosis not present

## 2016-06-14 DIAGNOSIS — I10 Essential (primary) hypertension: Secondary | ICD-10-CM | POA: Diagnosis not present

## 2016-06-14 LAB — COMPREHENSIVE METABOLIC PANEL
ALBUMIN: 4.1 g/dL (ref 3.5–5.0)
ALT: 8 U/L — AB (ref 14–54)
AST: 18 U/L (ref 15–41)
Alkaline Phosphatase: 74 U/L (ref 38–126)
Anion gap: 6 (ref 5–15)
BUN: 8 mg/dL (ref 6–20)
CO2: 26 mmol/L (ref 22–32)
CREATININE: 0.87 mg/dL (ref 0.44–1.00)
Calcium: 9.2 mg/dL (ref 8.9–10.3)
Chloride: 107 mmol/L (ref 101–111)
GFR calc Af Amer: >60 mL/min (ref >60)
GLUCOSE: 119 mg/dL — AB (ref 65–99)
Potassium: 2.4 mmol/L — CL (ref 3.5–5.1)
Sodium: 139 mmol/L (ref 135–145)
Total Bilirubin: 0.5 mg/dL (ref 0.3–1.2)
Total Protein: 8 g/dL (ref 6.5–8.1)

## 2016-06-14 LAB — CBC WITH DIFFERENTIAL/PLATELET
BASOS ABS: 0 10*3/uL (ref 0.0–0.1)
BASOS PCT: 0 %
Eosinophils Absolute: 0.1 10*3/uL (ref 0.0–0.7)
Eosinophils Relative: 1 %
HCT: 34.3 % — ABNORMAL LOW (ref 36.0–46.0)
Hemoglobin: 11.5 g/dL — ABNORMAL LOW (ref 12.0–15.0)
Lymphocytes Relative: 26 %
Lymphs Abs: 3 10*3/uL (ref 0.7–4.0)
MCH: 27.3 pg (ref 26.0–34.0)
MCHC: 33.5 g/dL (ref 30.0–36.0)
MCV: 81.3 fL (ref 78.0–100.0)
Monocytes Absolute: 0.7 10*3/uL (ref 0.1–1.0)
Monocytes Relative: 6 %
NEUTROS ABS: 7.8 10*3/uL — AB (ref 1.7–7.7)
NEUTROS PCT: 67 %
Platelets: 210 10*3/uL (ref 150–400)
RBC: 4.22 MIL/uL (ref 3.87–5.11)
RDW: 13.8 % (ref 11.5–15.5)
WBC: 11.6 10*3/uL — AB (ref 4.0–10.5)

## 2016-06-14 LAB — URINALYSIS, ROUTINE W REFLEX MICROSCOPIC
Bacteria, UA: NONE SEEN
Bilirubin Urine: NEGATIVE
Glucose, UA: NEGATIVE mg/dL
Hgb urine dipstick: NEGATIVE
Ketones, ur: 5 mg/dL — AB
Nitrite: NEGATIVE
PROTEIN: 30 mg/dL — AB
RBC / HPF: NONE SEEN RBC/hpf (ref 0–5)
SPECIFIC GRAVITY, URINE: 1.031 — AB (ref 1.005–1.030)
SQUAMOUS EPITHELIAL / LPF: NONE SEEN
WBC, UA: NONE SEEN WBC/hpf (ref 0–5)
pH: 5 (ref 5.0–8.0)

## 2016-06-14 LAB — CBG MONITORING, ED: GLUCOSE-CAPILLARY: 108 mg/dL — AB (ref 65–99)

## 2016-06-14 MED ORDER — GADOBENATE DIMEGLUMINE 529 MG/ML IV SOLN
10.0000 mL | Freq: Once | INTRAVENOUS | Status: AC | PRN
  Administered 2016-06-14: 10 mL via INTRAVENOUS

## 2016-06-14 MED ORDER — ACETAMINOPHEN 160 MG/5ML PO SOLN
650.0000 mg | Freq: Once | ORAL | Status: AC
  Administered 2016-06-14: 650 mg via ORAL
  Filled 2016-06-14: qty 20.3

## 2016-06-14 NOTE — ED Provider Notes (Signed)
[facility] DEPT Provider Note   CSN: [id number] Arrival date & time: 06/14/16  1813     History   Chief Complaint Chief Complaint  Patient presents with  . Headache    HPI Stacey Riddle is a 51 y.o. female.  The history is provided by the patient.  Headache   This is a recurrent problem. Episode onset: 4-5 days. Episode frequency: daily. Progression since onset: fluctuating. Pain location: migratory all over head. Quality: pain changes from burning to sharp to throbbing. The pain is moderate. The pain does not radiate. Pertinent negatives include no anorexia, no fever, no malaise/fatigue, no near-syncope, no syncope, no shortness of breath, no nausea and no vomiting. She has tried nothing for the symptoms.    Past Medical History:  Diagnosis Date  . Anemia   . Bronchitis   . Diabetes mellitus   . Hypertension   . Morbid obesity ([place])   . Shortness of breath     Patient Active Problem List   Diagnosis Date Noted  . Lapband APS + [facility] repair Jan 2009 03/08/2012  . Chest pain 07/26/2011  . Fitting and adjustment of gastric lap band 06/29/2011  . URI 05/26/2009  . DM 05/14/2009  . ALLERGIC RHINITIS 05/14/2009  . COUGH, CHRONIC 05/14/2009  . POLYCYSTIC OVARIAN DISEASE 05/13/2009  . HYPERTENSION 05/13/2009    Past Surgical History:  Procedure Laterality Date  . BARIATRIC SURGERY    . CARPAL TUNNEL RELEASE    . CESAREAN SECTION      OB History    No data available       Home Medications    Prior to Admission medications   Medication Sig Start Date End Date Taking? Authorizing Provider  BYDUREON 2 MG PEN Inject 2 mg as directed once a week.  05/25/16  Yes Historical Provider, MD  loratadine (CLARITIN) 10 MG tablet Take 10 mg by mouth daily as needed for allergies.    Yes Historical Provider, MD  losartan-hydrochlorothiazide (HYZAAR) 100-25 MG per tablet Take 1 tablet by mouth daily.   Yes Historical Provider, MD  Multiple Vitamin (MULTIVITAMIN WITH  MINERALS) TABS Take 1 tablet by mouth daily.   Yes Historical Provider, MD  potassium chloride SA (K-DUR,KLOR-CON) 20 MEQ tablet Take 40 mEq by mouth daily.   Yes Historical Provider, MD  polyethylene glycol (MIRALAX / GLYCOLAX) packet Take 17 g by mouth daily as needed for moderate constipation.  08/28/12   Johnathan Hausen, MD  potassium chloride SA (K-DUR,KLOR-CON) 20 MEQ tablet Take 1 tablet (20 mEq total) by mouth 2 (two) times daily. Patient not taking: Reported on 04/07/2015 07/21/12   Alfonzo Beers, MD    Family History Family History  Problem Relation Age of Onset  . Cancer Mother   . Hyperlipidemia Mother   . Osteoporosis Mother   . Heart disease Father   . Diabetes Father   . Hypertension Father     Social History Social History  Substance Use Topics  . Smoking status: Never Smoker  . Smokeless tobacco: Not on file  . Alcohol use Yes     Comment: Once a month     Allergies   Clarithromycin   Review of Systems Review of Systems  Constitutional: Negative for fever and malaise/fatigue.  Respiratory: Negative for shortness of breath.   Cardiovascular: Negative for syncope and near-syncope.  Gastrointestinal: Negative for anorexia, nausea and vomiting.  Neurological: Positive for headaches.  Ten systems are reviewed and are negative for acute change except as noted  in the HPI    Physical Exam Updated Vital Signs BP (!) 148/91 (BP Location: Right Arm)   Pulse 77   Temp 98.4 F (36.9 C) (Oral)   Resp 18   SpO2 100%   Physical Exam  Constitutional: She is oriented to person, place, and time. She appears well-developed and well-nourished. No distress.  HENT:  Head: Normocephalic and atraumatic.  Nose: Nose normal.  Eyes: Conjunctivae and EOM are normal. Pupils are equal, round, and reactive to light. Right eye exhibits no discharge. Left eye exhibits no discharge. No scleral icterus.  Fundoscopic exam:      The right eye shows no AV nicking, no exudate, no  hemorrhage and no papilledema.       The left eye shows no AV nicking, no exudate, no hemorrhage and no papilledema.  20/100 bilaterally with contacts in place. Left retinal hyperpigmentation  Neck: Normal range of motion. Neck supple.  Cardiovascular: Normal rate and regular rhythm.  Exam reveals no gallop and no friction rub.   No murmur heard. Pulmonary/Chest: Effort normal and breath sounds normal. No stridor. No respiratory distress. She has no rales.  Abdominal: Soft. She exhibits no distension. There is no tenderness.  Musculoskeletal: She exhibits no edema or tenderness.  Neurological: She is alert and oriented to person, place, and time.  Mental Status: Alert and oriented to person, place, and time. Attention and concentration normal. Speech clear. Recent memory is intac  Cranial Nerves  II Visual Fields: Intact to confrontation. Visual fields intact. III, IV, VI: Pupils equal and reactive to light and near. Full eye movement without nystagmus  V Facial Sensation: Normal. No weakness of masticatory muscles  VII: No facial weakness or asymmetry  VIII Auditory Acuity: Grossly normal  IX/X: The uvula is midline; the palate elevates symmetrically  XI: Normal sternocleidomastoid and trapezius strength  XII: The tongue is midline. No atrophy or fasciculations.   Motor System: Muscle Strength: 5/5 and symmetric in the upper and lower extremities. No pronation or drift.  Muscle Tone: Tone and muscle bulk are normal in the upper and lower extremities.   Reflexes: DTRs: 2+ and symmetrical in all four extremities. Plantar responses are flexor bilaterally.  Coordination: Intact finger-to-nose, heel-to-shin, and rapid alternating movements. No tremor.  Sensation: Intact to light touch, and pinprick. Negative Romberg test.  Gait: Routine and tandem gait are normal    Skin: Skin is warm and dry. No rash noted. She is not diaphoretic. No erythema.  Psychiatric: She has a normal mood and  affect.  Vitals reviewed.    ED Treatments / Results  Labs (all labs ordered are listed, but only abnormal results are displayed) Labs Reviewed  CBC WITH DIFFERENTIAL/PLATELET - Abnormal; Notable for the following:       Result Value   WBC 11.6 (*)    Hemoglobin 11.5 (*)    HCT 34.3 (*)    Neutro Abs 7.8 (*)    All other components within normal limits  COMPREHENSIVE METABOLIC PANEL - Abnormal; Notable for the following:    Potassium 2.4 (*)    Glucose, Bld 119 (*)    ALT 8 (*)    All other components within normal limits  URINALYSIS, ROUTINE W REFLEX MICROSCOPIC - Abnormal; Notable for the following:    APPearance TURBID (*)    Specific Gravity, Urine 1.031 (*)    Ketones, ur 5 (*)    Protein, ur 30 (*)    Leukocytes, UA LARGE (*)    Non  Squamous Epithelial 0-5 (*)    Crystals PRESENT (*)    All other components within normal limits  CBG MONITORING, ED - Abnormal; Notable for the following:    Glucose-Capillary 108 (*)    All other components within normal limits    EKG  EKG Interpretation None       Radiology Ct Head Wo Contrast  Result Date: 06/14/2016 CLINICAL DATA:  Initial evaluation for acute headache for 3 weeks. EXAM: CT HEAD WITHOUT CONTRAST TECHNIQUE: Contiguous axial images were obtained from the base of the skull through the vertex without intravenous contrast. COMPARISON:  Prior MRI from 06/14/2016. FINDINGS: Brain: Cerebral volume within normal limits for patient age. No evidence for acute intracranial hemorrhage. No findings to suggest acute large vessel territory infarct. No mass lesion, midline shift, or mass effect. Ventricles are normal in size without evidence for hydrocephalus. No extra-axial fluid collection identified. Vascular: No hyperdense vessel identified. Skull: Scalp soft tissues demonstrate no acute abnormality.Calvarium intact. Sinuses/Orbits: Globes and orbital soft tissues are within normal limits. Visualized paranasal sinuses are clear.  No mastoid effusion. IMPRESSION: Negative head CT.  No acute intracranial process identified. Electronically Signed   By: Jeannine Boga M.D.   On: 06/14/2016 23:10   Mr Jeri Cos MW Contrast  Result Date: 06/14/2016 CLINICAL DATA:  Headache for 3 weeks EXAM: MRI HEAD WITHOUT AND WITH CONTRAST MRV HEAD WITHOUT CONTRAST TECHNIQUE: Multiplanar, multiecho pulse sequences of the brain and surrounding structures were obtained without and with intravenous contrast. Angiographic images of the intracranial venous structures were obtained using MRV technique without intravenous contrast. CONTRAST:  9mL MULTIHANCE GADOBENATE DIMEGLUMINE 529 MG/ML IV SOLN COMPARISON:  None. FINDINGS: MR BRAIN FINDINGS: Brain: No focal diffusion restriction to indicate acute infarct. No intraparenchymal hemorrhage. The brain parenchymal signal is normal. No mass lesion or midline shift. No hydrocephalus or extra-axial fluid collection. The midline structures are normal. No age advanced or lobar predominant atrophy. Vascular: Major intracranial arterial and venous sinus flow voids are preserved. No evidence of chronic microhemorrhage or amyloid angiopathy. Skull and upper cervical spine: The visualized skull base, calvarium, upper cervical spine and extracranial soft tissues are normal. Sinuses/Orbits: No fluid levels or advanced mucosal thickening. No mastoid effusion. Normal orbits. MRV FINDINGS: Superior sagittal sinus: Normal. Straight sinus: Normal. Inferior sagittal sinus, vein of Galen and internal cerebral veins: Normal. Transverse sinuses: Hypoplastic right transverse sinus is a normal congenital variant. The left transverse sinus is also normal. Sigmoid sinuses: Normal. Visualized jugular veins: Normal. IMPRESSION: Normal MRI and MRV of the brain. Electronically Signed   By: Ulyses Jarred M.D.   On: 06/14/2016 22:46   Mr Mrv Head Wo Cm  Result Date: 06/14/2016 CLINICAL DATA:  Headache for 3 weeks EXAM: MRI HEAD WITHOUT AND  WITH CONTRAST MRV HEAD WITHOUT CONTRAST TECHNIQUE: Multiplanar, multiecho pulse sequences of the brain and surrounding structures were obtained without and with intravenous contrast. Angiographic images of the intracranial venous structures were obtained using MRV technique without intravenous contrast. CONTRAST:  52mL MULTIHANCE GADOBENATE DIMEGLUMINE 529 MG/ML IV SOLN COMPARISON:  None. FINDINGS: MR BRAIN FINDINGS: Brain: No focal diffusion restriction to indicate acute infarct. No intraparenchymal hemorrhage. The brain parenchymal signal is normal. No mass lesion or midline shift. No hydrocephalus or extra-axial fluid collection. The midline structures are normal. No age advanced or lobar predominant atrophy. Vascular: Major intracranial arterial and venous sinus flow voids are preserved. No evidence of chronic microhemorrhage or amyloid angiopathy. Skull and upper cervical spine: The visualized skull base,  calvarium, upper cervical spine and extracranial soft tissues are normal. Sinuses/Orbits: No fluid levels or advanced mucosal thickening. No mastoid effusion. Normal orbits. MRV FINDINGS: Superior sagittal sinus: Normal. Straight sinus: Normal. Inferior sagittal sinus, vein of Galen and internal cerebral veins: Normal. Transverse sinuses: Hypoplastic right transverse sinus is a normal congenital variant. The left transverse sinus is also normal. Sigmoid sinuses: Normal. Visualized jugular veins: Normal. IMPRESSION: Normal MRI and MRV of the brain. Electronically Signed   By: Ulyses Jarred M.D.   On: 06/14/2016 22:46    Procedures Procedures (including critical care time)  Medications Ordered in ED Medications  acetaminophen (TYLENOL) solution 650 mg (650 mg Oral Given 06/14/16 2116)  gadobenate dimeglumine (MULTIHANCE) injection 10 mL (10 mLs Intravenous Contrast Given 06/14/16 2214)     Initial Impression / Assessment and Plan / ED Course  I have reviewed the triage vital signs and the nursing  notes.  Pertinent labs & imaging results that were available during my care of the patient were reviewed by me and considered in my medical decision making (see chart for details).     Given the monocular retinal findings with new onset headache, we obtained a CT head to rule out intracranial mass, which was negative. Also obtain MRI/MRV which did not reveal evidence of venous sinus thrombosis. Low suspicion for meningitis, I I H. Doubt retinal venous occlusion given the lack of vision loss. Discussed findings with ophthalmology who will follow-up with the patient in the morning for further evaluation.  Patient's headache completely resolved following Tylenol.   Safe for discharge with strict return precautions.  Final Clinical Impressions(s) / ED Diagnoses   Final diagnoses:  New daily persistent headache  Retinal pigmentation   Disposition: Discharge  Condition: Good  I have discussed the results, Dx and Tx plan with the patient who expressed understanding and agree(s) with the plan. Discharge instructions discussed at great length. The patient was given strict return precautions who verbalized understanding of the instructions. No further questions at time of discharge.    New Prescriptions   No medications on file    Follow Up: Vernie Shanks, [place] [address] [phone]  Schedule an appointment as soon as possible for a visit  As needed  Clista Bernhardt, MD [place] [address] [phone]  Today walk in in the morning to be evaluated by the Ophthalmologist for your eye finding      Fatima Blank, MD 06/15/16 [phone]

## 2016-06-14 NOTE — ED Notes (Signed)
Patient transported to CT 

## 2016-06-14 NOTE — ED Notes (Signed)
Patient transported to MRI 

## 2016-06-14 NOTE — ED Triage Notes (Signed)
Pt states that she has had a headache moving around her head x 3 weeks off and on. Neuro intact. Alert and oriented.

## 2016-06-16 DIAGNOSIS — R51 Headache: Secondary | ICD-10-CM | POA: Diagnosis not present

## 2016-08-29 DIAGNOSIS — E7800 Pure hypercholesterolemia, unspecified: Secondary | ICD-10-CM | POA: Diagnosis not present

## 2016-08-29 DIAGNOSIS — E1165 Type 2 diabetes mellitus with hyperglycemia: Secondary | ICD-10-CM | POA: Diagnosis not present

## 2016-08-29 DIAGNOSIS — R1010 Upper abdominal pain, unspecified: Secondary | ICD-10-CM | POA: Diagnosis not present

## 2016-08-30 ENCOUNTER — Other Ambulatory Visit (HOSPITAL_COMMUNITY): Payer: Self-pay | Admitting: Family Medicine

## 2016-08-30 DIAGNOSIS — R1010 Upper abdominal pain, unspecified: Secondary | ICD-10-CM

## 2016-09-01 ENCOUNTER — Encounter (HOSPITAL_COMMUNITY): Payer: Self-pay | Admitting: *Deleted

## 2016-09-01 ENCOUNTER — Emergency Department (HOSPITAL_COMMUNITY)
Admission: EM | Admit: 2016-09-01 | Discharge: 2016-09-02 | Disposition: A | Discharge status: Home / Self Care | Payer: 59 | Attending: Emergency Medicine | Admitting: Emergency Medicine

## 2016-09-01 DIAGNOSIS — E119 Type 2 diabetes mellitus without complications: Secondary | ICD-10-CM | POA: Insufficient documentation

## 2016-09-01 DIAGNOSIS — R197 Diarrhea, unspecified: Secondary | ICD-10-CM | POA: Diagnosis not present

## 2016-09-01 DIAGNOSIS — Z79899 Other long term (current) drug therapy: Secondary | ICD-10-CM | POA: Insufficient documentation

## 2016-09-01 DIAGNOSIS — I10 Essential (primary) hypertension: Secondary | ICD-10-CM | POA: Diagnosis not present

## 2016-09-01 DIAGNOSIS — R1013 Epigastric pain: Secondary | ICD-10-CM | POA: Diagnosis not present

## 2016-09-01 LAB — URINALYSIS, ROUTINE W REFLEX MICROSCOPIC
Bilirubin Urine: NEGATIVE
GLUCOSE, UA: NEGATIVE mg/dL
HGB URINE DIPSTICK: NEGATIVE
Ketones, ur: NEGATIVE mg/dL
LEUKOCYTES UA: NEGATIVE
NITRITE: NEGATIVE
Protein, ur: 100 mg/dL — AB
SPECIFIC GRAVITY, URINE: 1.027 (ref 1.005–1.030)
pH: 5 (ref 5.0–8.0)

## 2016-09-01 LAB — POC OCCULT BLOOD, ED: FECAL OCCULT BLD: NEGATIVE

## 2016-09-01 NOTE — ED Provider Notes (Signed)
[place] [facility] DEPT Provider Note   CSN: [id number] Arrival date & time: 09/01/16  2132     History   Chief Complaint Chief Complaint  Patient presents with  . Abdominal Pain    HPI Stacey Riddle is a 52 y.o. female.  HPI   Epigastric abdominal pain for 2-3 weeks, constant aching, tonight had sharper left upper quadrant abdominal pain. Pain has now improved. Took a few tylenol which didn't help.  Is worse with eating.  No nausea or vomiting.  Diarrhea 7-8 times per day for a few weeks.  Prior to that has had it off and on, thought it was IBS.  Saw Dr. For it who was concerned about GB and scheduled [place] for Tuesday.  No urinary symptoms.  Had hx of lapband surgery.     Past Medical History:  Diagnosis Date  . Anemia   . Bronchitis   . Diabetes mellitus   . Hypertension   . Morbid obesity ([place])   . Shortness of breath     Patient Active Problem List   Diagnosis Date Noted  . Lapband APS + [facility] repair Jan 2009 03/08/2012  . Chest pain 07/26/2011  . Fitting and adjustment of gastric lap band 06/29/2011  . URI 05/26/2009  . DM 05/14/2009  . ALLERGIC RHINITIS 05/14/2009  . COUGH, CHRONIC 05/14/2009  . POLYCYSTIC OVARIAN DISEASE 05/13/2009  . HYPERTENSION 05/13/2009    Past Surgical History:  Procedure Laterality Date  . BARIATRIC SURGERY    . CARPAL TUNNEL RELEASE    . CESAREAN SECTION      OB History    No data available       Home Medications    Prior to Admission medications   Medication Sig Start Date End Date Taking? Authorizing Provider  acetaminophen (TYLENOL) 500 MG tablet Take 1,000 mg by mouth every 6 (six) hours as needed for mild pain.   Yes [provider]  bismuth subsalicylate (PEPTO BISMOL) 262 MG chewable tablet Chew 524 mg by mouth as needed for indigestion.   Yes [provider]  BYDUREON 2 MG PEN Inject 2 mg as directed once a week.  05/25/16  Yes [provider]  ibuprofen (ADVIL,MOTRIN) 200 MG tablet Take  400 mg by mouth every 6 (six) hours as needed (pain).   Yes [provider]  loratadine (CLARITIN) 10 MG tablet Take 10 mg by mouth daily as needed for allergies.    Yes [provider]  losartan (COZAAR) 25 MG tablet Take 25 mg by mouth daily.   Yes [provider]  Multiple Vitamin (MULTIVITAMIN WITH MINERALS) TABS Take 1 tablet by mouth daily.   Yes [provider]  polyethylene glycol (MIRALAX / GLYCOLAX) packet Take 17 g by mouth daily as needed for moderate constipation.  08/28/12  Yes Johnathan Hausen, MD  potassium chloride SA (K-DUR,KLOR-CON) 20 MEQ tablet Take 40 mEq by mouth daily.   Yes [provider]  omeprazole (PRILOSEC) 20 MG capsule Take 2 capsules (40 mg total) by mouth 2 (two) times daily before a meal. 09/02/16 09/16/16  Gareth Morgan, MD  potassium chloride SA (K-DUR,KLOR-CON) 20 MEQ tablet Take 1 tablet (20 mEq total) by mouth 2 (two) times daily. Patient not taking: Reported on 04/07/2015 07/21/12   Alfonzo Beers, MD    Family History Family History  Problem Relation Age of Onset  . Cancer Mother   . Hyperlipidemia Mother   . Osteoporosis Mother   . Heart disease Father   .  Diabetes Father   . Hypertension Father     Social History Social History  Substance Use Topics  . Smoking status: Never Smoker  . Smokeless tobacco: Never Used  . Alcohol use Yes     Comment: Once a month     Allergies   Clarithromycin   Review of Systems Review of Systems  Constitutional: Negative for fever.  HENT: Negative for sore throat.   Eyes: Negative for visual disturbance.  Respiratory: Negative for cough and shortness of breath (winded but not from chest but from feeling bloated).   Cardiovascular: Negative for chest pain.  Gastrointestinal: Positive for abdominal distention, abdominal pain and diarrhea. Negative for constipation, nausea and vomiting.  Genitourinary: Negative for difficulty urinating and dysuria.    Musculoskeletal: Negative for back pain and neck pain.  Skin: Negative for rash.  Neurological: Negative for syncope and headaches.     Physical Exam Updated Vital Signs BP (!) 153/97 (BP Location: Right Arm)   Pulse 74   Temp 98.2 F (36.8 C) (Oral)   Resp 18   SpO2 97%   Physical Exam  Constitutional: She is oriented to person, place, and time. She appears well-developed and well-nourished. No distress.  HENT:  Head: Normocephalic and atraumatic.  Eyes: Conjunctivae and EOM are normal.  Neck: Normal range of motion.  Cardiovascular: Normal rate, regular rhythm, normal heart sounds and intact distal pulses.  Exam reveals no gallop and no friction rub.   No murmur heard. Pulmonary/Chest: Effort normal and breath sounds normal. No respiratory distress. She has no wheezes. She has no rales.  Abdominal: Soft. She exhibits no distension. There is no tenderness. There is no guarding.  Genitourinary: Rectal exam shows guaiac negative stool.  Musculoskeletal: She exhibits no edema or tenderness.  Neurological: She is alert and oriented to person, place, and time.  Skin: Skin is warm and dry. No rash noted. She is not diaphoretic. No erythema.  Nursing note and vitals reviewed.    ED Treatments / Results  Labs (all labs ordered are listed, but only abnormal results are displayed) Labs Reviewed  URINALYSIS, ROUTINE W REFLEX MICROSCOPIC - Abnormal; Notable for the following:       Result Value   APPearance CLOUDY (*)    Protein, ur 100 (*)    Bacteria, UA MANY (*)    Squamous Epithelial / LPF 6-30 (*)    All other components within normal limits  LIPASE, BLOOD - Abnormal; Notable for the following:    Lipase 65 (*)    All other components within normal limits  COMPREHENSIVE METABOLIC PANEL - Abnormal; Notable for the following:    CO2 19 (*)    ALT 9 (*)    All other components within normal limits  CBC WITH DIFFERENTIAL/PLATELET - Abnormal; Notable for the following:     WBC 12.7 (*)    Hemoglobin 11.2 (*)    HCT 33.2 (*)    Neutro Abs 9.0 (*)    All other components within normal limits  CBC  COMPREHENSIVE METABOLIC PANEL  LIPASE, BLOOD  POC OCCULT BLOOD, ED    EKG  EKG Interpretation None       Radiology No results found.  Procedures Procedures (including critical care time)  Medications Ordered in ED Medications  pantoprazole sodium (PROTONIX) 40 mg/20 mL oral suspension 40 mg (not administered)     Initial Impression / Assessment and Plan / ED Course  I have reviewed the triage vital signs and the nursing notes.  Pertinent labs & imaging results that were available during my care of the patient were reviewed by me and considered in my medical decision making (see chart for details).     51 year old female with a history of hypertension, diabetes, history of gastric lap band in 2009, with revision in 2011, who presents with concern for epigastric abdominal pain. At this time, patient has no abdominal pain or tenderness on exam. Have low suspicion for cholecystitis, appendicitis. Labs shows mild elevation, however no significant increase to suggest pancreatitis. Given patient without vomiting, and without pain or tenderness at this time, have low suspicion for gastric band complications such as band erosion, band prolapse, and no sign of port infection.  Pouch dilation may be possible etiology of pain. Given duration of symptoms, association with eating, no chest pain, doubt cardiac etiology.  Given diarrhea, symptoms may represent other gastroenteritis or IBS.  Epigastric pain may represent gastritis, GERD or PUD.  No sign of acute GI bleed, stool hemoccult negative.  Will initiate BID PPI for symptoms.   Recommend close PCP follow up, follow up with her surgeon, Dr. Hassell Done given hx of lap band, as well as follow up with GI if symptoms continue.  Gave option of CT imaging at this time, however discussed with pt that given she is not  vomiting, not having pain, that continued outpatient imaging and treatment with PPI is appropriate.      Final Clinical Impressions(s) / ED Diagnoses   Final diagnoses:  Epigastric abdominal pain  Diarrhea, unspecified type    New Prescriptions New Prescriptions   OMEPRAZOLE (PRILOSEC) 20 MG CAPSULE    Take 2 capsules (40 mg total) by mouth 2 (two) times daily before a meal.     Gareth Morgan, MD 09/02/16 [phone]

## 2016-09-01 NOTE — ED Triage Notes (Signed)
Pt complains of right upper quadrant pain and diarrhea for a week. Pt denies nausea or emesis. Pt was seen by her PCP 3 days ago and has ultrasound scheduled for next week but was told to come in if her pain was worse. Pt has hx of IBS.

## 2016-09-02 LAB — COMPREHENSIVE METABOLIC PANEL
ALBUMIN: 3.8 g/dL (ref 3.5–5.0)
ALT: 9 U/L — ABNORMAL LOW (ref 14–54)
ANION GAP: 9 (ref 5–15)
AST: 15 U/L (ref 15–41)
Alkaline Phosphatase: 72 U/L (ref 38–126)
BILIRUBIN TOTAL: 0.4 mg/dL (ref 0.3–1.2)
BUN: 9 mg/dL (ref 6–20)
CHLORIDE: 111 mmol/L (ref 101–111)
CO2: 19 mmol/L — ABNORMAL LOW (ref 22–32)
Calcium: 8.9 mg/dL (ref 8.9–10.3)
Creatinine, Ser: 0.81 mg/dL (ref 0.44–1.00)
GFR calc Af Amer: >60 mL/min (ref >60)
Glucose, Bld: 96 mg/dL (ref 65–99)
POTASSIUM: 3.5 mmol/L (ref 3.5–5.1)
Sodium: 139 mmol/L (ref 135–145)
TOTAL PROTEIN: 7 g/dL (ref 6.5–8.1)

## 2016-09-02 LAB — CBC WITH DIFFERENTIAL/PLATELET
BASOS ABS: 0 10*3/uL (ref 0.0–0.1)
Basophils Relative: 0 %
EOS PCT: 0 %
Eosinophils Absolute: 0.1 10*3/uL (ref 0.0–0.7)
HEMATOCRIT: 33.2 % — AB (ref 36.0–46.0)
HEMOGLOBIN: 11.2 g/dL — AB (ref 12.0–15.0)
LYMPHS ABS: 2.8 10*3/uL (ref 0.7–4.0)
LYMPHS PCT: 23 %
MCH: 27.5 pg (ref 26.0–34.0)
MCHC: 33.7 g/dL (ref 30.0–36.0)
MCV: 81.4 fL (ref 78.0–100.0)
Monocytes Absolute: 0.8 10*3/uL (ref 0.1–1.0)
Monocytes Relative: 6 %
NEUTROS ABS: 9 10*3/uL — AB (ref 1.7–7.7)
Neutrophils Relative %: 71 %
Platelets: 199 10*3/uL (ref 150–400)
RBC: 4.08 MIL/uL (ref 3.87–5.11)
RDW: 13.7 % (ref 11.5–15.5)
WBC: 12.7 10*3/uL — AB (ref 4.0–10.5)

## 2016-09-02 LAB — LIPASE, BLOOD: LIPASE: 65 U/L — AB (ref 11–51)

## 2016-09-02 MED ORDER — PANTOPRAZOLE SODIUM 40 MG PO PACK
40.0000 mg | PACK | ORAL | Status: AC
  Administered 2016-09-02: 40 mg via ORAL
  Filled 2016-09-02: qty 20

## 2016-09-02 MED ORDER — OMEPRAZOLE 20 MG PO CPDR: 40.0000 mg | DELAYED_RELEASE_CAPSULE | Freq: Two times a day (BID) | ORAL | 0 refills | Status: DC

## 2016-09-05 ENCOUNTER — Ambulatory Visit (HOSPITAL_COMMUNITY)
Admission: RE | Admit: 2016-09-05 | Discharge: 2016-09-05 | Disposition: A | Discharge status: Home / Self Care | Payer: 59 | Source: Ambulatory Visit | Attending: Family Medicine | Admitting: Family Medicine

## 2016-09-05 DIAGNOSIS — R1010 Upper abdominal pain, unspecified: Secondary | ICD-10-CM | POA: Diagnosis not present

## 2016-09-05 DIAGNOSIS — N200 Calculus of kidney: Secondary | ICD-10-CM | POA: Insufficient documentation

## 2016-09-05 DIAGNOSIS — R1084 Generalized abdominal pain: Secondary | ICD-10-CM | POA: Diagnosis not present

## 2016-09-05 DIAGNOSIS — K8020 Calculus of gallbladder without cholecystitis without obstruction: Secondary | ICD-10-CM | POA: Insufficient documentation

## 2016-09-08 DIAGNOSIS — Z1231 Encounter for screening mammogram for malignant neoplasm of breast: Secondary | ICD-10-CM | POA: Diagnosis not present

## 2016-09-08 DIAGNOSIS — Z124 Encounter for screening for malignant neoplasm of cervix: Secondary | ICD-10-CM | POA: Diagnosis not present

## 2016-09-08 DIAGNOSIS — Z01419 Encounter for gynecological examination (general) (routine) without abnormal findings: Secondary | ICD-10-CM | POA: Diagnosis not present

## 2016-10-13 DIAGNOSIS — N879 Dysplasia of cervix uteri, unspecified: Secondary | ICD-10-CM | POA: Diagnosis not present

## 2016-10-13 DIAGNOSIS — N870 Mild cervical dysplasia: Secondary | ICD-10-CM | POA: Diagnosis not present

## 2016-10-25 DIAGNOSIS — Z9884 Bariatric surgery status: Secondary | ICD-10-CM | POA: Diagnosis not present

## 2016-10-25 DIAGNOSIS — K8020 Calculus of gallbladder without cholecystitis without obstruction: Secondary | ICD-10-CM | POA: Diagnosis not present

## 2016-10-30 DIAGNOSIS — N200 Calculus of kidney: Secondary | ICD-10-CM | POA: Diagnosis not present

## 2016-11-01 ENCOUNTER — Ambulatory Visit: Payer: Self-pay | Admitting: Surgery

## 2016-11-10 NOTE — Patient Instructions (Addendum)
Stacey Riddle  11/10/2016   Your procedure is scheduled on: 11-23-16  Report to [facility] Main  Entrance Take [place]  elevators to 3rd floor to  [place] at [place].    Call this number if you have problems the morning of surgery [phone]    Remember: ONLY 1 PERSON MAY GO WITH YOU TO SHORT STAY TO GET  READY MORNING OF [place].  Do not eat food or drink liquids :After Midnight.     Take these medicines the morning of surgery with A SIP OF WATER: tylenol as needed, omeprazole (prilosec)                                 You may not have any metal on your body including hair pins and              piercings  Do not wear jewelry, make-up, lotions, powders or perfumes, deodorant             Do not wear nail polish.  Do not shave  48 hours prior to surgery.             Do not bring valuables to the hospital. [place].  Contacts, dentures or bridgework may not be worn into surgery.       Patients discharged the day of surgery will not be allowed to drive home.  Name and phone number of your driver:  Special Instructions: N/A              Please read over the following fact sheets you were given: _____________________________________________________________________             [facility] - Preparing for Surgery Before surgery, you can play an important role.  Because skin is not sterile, your skin needs to be as free of germs as possible.  You can reduce the number of germs on your skin by washing with CHG (chlorahexidine gluconate) soap before surgery.  CHG is an antiseptic cleaner which kills germs and bonds with the skin to continue killing germs even after washing. Please DO NOT use if you have an allergy to CHG or antibacterial soaps.  If your skin becomes reddened/irritated stop using the CHG and inform your nurse when you arrive at Short Stay. Do not shave (including legs and underarms)  for at least 48 hours prior to the first CHG shower.  You may shave your face/neck. Please follow these instructions carefully:  1.  Shower with CHG Soap the night before surgery and the  morning of Surgery.  2.  If you choose to wash your hair, wash your hair first as usual with your  normal  shampoo.  3.  After you shampoo, rinse your hair and body thoroughly to remove the  shampoo.                           4.  Use CHG as you would any other liquid soap.  You can apply chg directly  to the skin and wash                       Gently with a scrungie or  clean washcloth.  5.  Apply the CHG Soap to your body ONLY FROM THE NECK DOWN.   Do not use on face/ open                           Wound or open sores. Avoid contact with eyes, ears mouth and genitals (private parts).                       Wash face,  Genitals (private parts) with your normal soap.             6.  Wash thoroughly, paying special attention to the area where your surgery  will be performed.  7.  Thoroughly rinse your body with warm water from the neck down.  8.  DO NOT shower/wash with your normal soap after using and rinsing off  the CHG Soap.                9.  Pat yourself dry with a clean towel.            10.  Wear clean pajamas.            11.  Place clean sheets on your bed the night of your first shower and do not  sleep with pets. Day of Surgery : Do not apply any lotions/deodorants the morning of surgery.  Please wear clean clothes to the hospital/surgery center.  FAILURE TO FOLLOW THESE INSTRUCTIONS MAY RESULT IN THE CANCELLATION OF YOUR SURGERY PATIENT SIGNATURE_________________________________  NURSE SIGNATURE__________________________________  ________________________________________________________________________   How to Manage Your Diabetes Before and After Surgery  Why is it important to control my blood sugar before and after surgery? . Improving blood sugar levels before and after surgery helps  healing and can limit problems. . A way of improving blood sugar control is eating a healthy diet by: o  Eating less sugar and carbohydrates o  Increasing activity/exercise o  Talking with your doctor about reaching your blood sugar goals . High blood sugars (greater than 180 mg/dL) can raise your risk of infections and slow your recovery, so you will need to focus on controlling your diabetes during the weeks before surgery. . Make sure that the doctor who takes care of your diabetes knows about your planned surgery including the date and location.  How do I manage my blood sugar before surgery? . Check your blood sugar at least 4 times a day, starting 2 days before surgery, to make sure that the level is not too high or low. o Check your blood sugar the morning of your surgery when you wake up and every 2 hours until you get to the Short Stay unit. . If your blood sugar is less than 70 mg/dL, you will need to treat for low blood sugar: o Do not take insulin. o Treat a low blood sugar (less than 70 mg/dL) with  cup of clear juice (cranberry or apple), 4 glucose tablets, OR glucose gel. o Recheck blood sugar in 15 minutes after treatment (to make sure it is greater than 70 mg/dL). If your blood sugar is not greater than 70 mg/dL on recheck, call [phone] for further instructions. . Report your blood sugar to the short stay nurse when you get to Short Stay.  . If you are admitted to the hospital after surgery: o Your blood sugar will be checked by the staff and you will probably be given insulin after  surgery (instead of oral diabetes medicines) to make sure you have good blood sugar levels. o The goal for blood sugar control after surgery is 80-180 mg/dL.   WHAT DO I DO ABOUT MY DIABETES MEDICATION?   Marland Kitchen The day of surgery, do not take other diabetes injectables, including Byetta (exenatide), Bydureon (exenatide ER), Victoza (liraglutide), or Trulicity (dulaglutide).    Patient  Signature:  Date:   Nurse Signature:  Date:   Reviewed and Endorsed by [facility] Patient Education Committee, August 2015

## 2016-11-13 ENCOUNTER — Encounter (HOSPITAL_COMMUNITY)
Admission: RE | Admit: 2016-11-13 | Discharge: 2016-11-13 | Disposition: A | Discharge status: Home / Self Care | Payer: 59 | Source: Ambulatory Visit | Attending: Surgery | Admitting: Surgery

## 2016-11-13 ENCOUNTER — Encounter (INDEPENDENT_AMBULATORY_CARE_PROVIDER_SITE_OTHER): Payer: Self-pay

## 2016-11-13 ENCOUNTER — Encounter (HOSPITAL_COMMUNITY): Payer: Self-pay

## 2016-11-13 DIAGNOSIS — E119 Type 2 diabetes mellitus without complications: Secondary | ICD-10-CM | POA: Diagnosis not present

## 2016-11-13 DIAGNOSIS — I10 Essential (primary) hypertension: Secondary | ICD-10-CM | POA: Diagnosis not present

## 2016-11-13 DIAGNOSIS — Z01818 Encounter for other preprocedural examination: Secondary | ICD-10-CM | POA: Insufficient documentation

## 2016-11-13 LAB — BASIC METABOLIC PANEL
ANION GAP: 5 (ref 5–15)
BUN: 9 mg/dL (ref 6–20)
CALCIUM: 9 mg/dL (ref 8.9–10.3)
CO2: 23 mmol/L (ref 22–32)
Chloride: 110 mmol/L (ref 101–111)
Creatinine, Ser: 0.83 mg/dL (ref 0.44–1.00)
GFR calc non Af Amer: >60 mL/min (ref >60)
GLUCOSE: 121 mg/dL — AB (ref 65–99)
Potassium: 3.7 mmol/L (ref 3.5–5.1)
Sodium: 138 mmol/L (ref 135–145)

## 2016-11-13 LAB — CBC
HCT: 33.2 % — ABNORMAL LOW (ref 36.0–46.0)
HEMOGLOBIN: 10.9 g/dL — AB (ref 12.0–15.0)
MCH: 27 pg (ref 26.0–34.0)
MCHC: 32.8 g/dL (ref 30.0–36.0)
MCV: 82.2 fL (ref 78.0–100.0)
PLATELETS: 204 10*3/uL (ref 150–400)
RBC: 4.04 MIL/uL (ref 3.87–5.11)
RDW: 13.8 % (ref 11.5–15.5)
WBC: 11.6 10*3/uL — ABNORMAL HIGH (ref 4.0–10.5)

## 2016-11-13 LAB — HCG, SERUM, QUALITATIVE: PREG SERUM: NEGATIVE

## 2016-11-13 LAB — HEMOGLOBIN A1C
HEMOGLOBIN A1C: 6.3 % — AB (ref 4.8–5.6)
MEAN PLASMA GLUCOSE: 134.11 mg/dL

## 2016-11-13 LAB — GLUCOSE, CAPILLARY: GLUCOSE-CAPILLARY: 135 mg/dL — AB (ref 65–99)

## 2016-11-22 NOTE — Anesthesia Preprocedure Evaluation (Addendum)
Anesthesia Evaluation  Patient identified by MRN, date of birth, ID band Patient awake    Reviewed: Allergy & Precautions, H&P , Patient's Chart, lab work & pertinent test results, reviewed documented beta blocker date and time   Airway Mallampati: II  TM Distance: >3 FB Neck ROM: full    Dental no notable dental hx. (+) Chipped,    Pulmonary    Pulmonary exam normal breath sounds clear to auscultation       Cardiovascular hypertension,  Rhythm:regular Rate:Normal     Neuro/Psych    GI/Hepatic   Endo/Other  diabetes  Renal/GU      Musculoskeletal   Abdominal   Peds  Hematology   Anesthesia Other Findings Bronchitis    Anemia   Morbid obesity ([place])    Diabetes mellitus      Reproductive/Obstetrics                            Anesthesia Physical Anesthesia Plan  ASA: II  Anesthesia Plan: General   Post-op Pain Management:    Induction: Intravenous  PONV Risk Score and Plan: 2 and Ondansetron, Dexamethasone, Midazolam and Scopolamine patch - Pre-op  Airway Management Planned: Oral ETT  Additional Equipment:   Intra-op Plan:   Post-operative Plan: Extubation in OR  Informed Consent: I have reviewed the patients History and Physical, chart, labs and discussed the procedure including the risks, benefits and alternatives for the proposed anesthesia with the patient or authorized representative who has indicated his/her understanding and acceptance.   Dental Advisory Given  Plan Discussed with: CRNA and Surgeon  Anesthesia Plan Comments: (  )        Anesthesia Quick Evaluation

## 2016-11-23 ENCOUNTER — Ambulatory Visit (HOSPITAL_COMMUNITY): Payer: 59 | Admitting: Anesthesiology

## 2016-11-23 ENCOUNTER — Encounter (HOSPITAL_COMMUNITY): Payer: Self-pay | Admitting: *Deleted

## 2016-11-23 ENCOUNTER — Encounter (HOSPITAL_COMMUNITY)
Admission: RE | Disposition: A | Discharge status: Home / Self Care | Payer: Self-pay | Source: Ambulatory Visit | Attending: Surgery

## 2016-11-23 ENCOUNTER — Ambulatory Visit (HOSPITAL_COMMUNITY): Payer: 59

## 2016-11-23 ENCOUNTER — Ambulatory Visit (HOSPITAL_COMMUNITY)
Admission: RE | Admit: 2016-11-23 | Discharge: 2016-11-23 | Disposition: A | Discharge status: Home / Self Care | Payer: 59 | Source: Ambulatory Visit | Attending: Surgery | Admitting: Surgery

## 2016-11-23 DIAGNOSIS — Z419 Encounter for procedure for purposes other than remedying health state, unspecified: Secondary | ICD-10-CM

## 2016-11-23 DIAGNOSIS — Z881 Allergy status to other antibiotic agents status: Secondary | ICD-10-CM | POA: Diagnosis not present

## 2016-11-23 DIAGNOSIS — K8010 Calculus of gallbladder with chronic cholecystitis without obstruction: Secondary | ICD-10-CM | POA: Diagnosis not present

## 2016-11-23 DIAGNOSIS — E119 Type 2 diabetes mellitus without complications: Secondary | ICD-10-CM | POA: Insufficient documentation

## 2016-11-23 DIAGNOSIS — I10 Essential (primary) hypertension: Secondary | ICD-10-CM | POA: Diagnosis not present

## 2016-11-23 DIAGNOSIS — Z79899 Other long term (current) drug therapy: Secondary | ICD-10-CM | POA: Diagnosis not present

## 2016-11-23 DIAGNOSIS — Z5331 Laparoscopic surgical procedure converted to open procedure: Secondary | ICD-10-CM | POA: Diagnosis not present

## 2016-11-23 DIAGNOSIS — Z6825 Body mass index (BMI) 25.0-25.9, adult: Secondary | ICD-10-CM | POA: Insufficient documentation

## 2016-11-23 DIAGNOSIS — K824 Cholesterolosis of gallbladder: Secondary | ICD-10-CM | POA: Diagnosis not present

## 2016-11-23 DIAGNOSIS — K8020 Calculus of gallbladder without cholecystitis without obstruction: Secondary | ICD-10-CM | POA: Diagnosis not present

## 2016-11-23 HISTORY — PX: CHOLECYSTECTOMY: SHX55

## 2016-11-23 LAB — GLUCOSE, CAPILLARY
GLUCOSE-CAPILLARY: 75 mg/dL (ref 65–99)
Glucose-Capillary: 122 mg/dL — ABNORMAL HIGH (ref 65–99)

## 2016-11-23 SURGERY — LAPAROSCOPIC CHOLECYSTECTOMY WITH INTRAOPERATIVE CHOLANGIOGRAM
Anesthesia: General | Site: Abdomen

## 2016-11-23 MED ORDER — SODIUM CHLORIDE 0.9% FLUSH: 3.0000 mL | INTRAVENOUS | Status: DC | PRN

## 2016-11-23 MED ORDER — DEXAMETHASONE SODIUM PHOSPHATE 10 MG/ML IJ SOLN
INTRAMUSCULAR | Status: AC
  Filled 2016-11-23: qty 1

## 2016-11-23 MED ORDER — SODIUM CHLORIDE 0.9 % IV SOLN: 250.0000 mL | INTRAVENOUS | Status: DC | PRN

## 2016-11-23 MED ORDER — 0.9 % SODIUM CHLORIDE (POUR BTL) OPTIME
TOPICAL | Status: DC | PRN
  Administered 2016-11-23: 1000 mL

## 2016-11-23 MED ORDER — BUPIVACAINE LIPOSOME 1.3 % IJ SUSP
20.0000 mL | Freq: Once | INTRAMUSCULAR | Status: DC
  Filled 2016-11-23: qty 20

## 2016-11-23 MED ORDER — ONDANSETRON HCL 4 MG/2ML IJ SOLN
INTRAMUSCULAR | Status: DC | PRN
  Administered 2016-11-23: 4 mg via INTRAVENOUS

## 2016-11-23 MED ORDER — ACETAMINOPHEN 325 MG PO TABS
650.0000 mg | ORAL_TABLET | ORAL | Status: DC | PRN
  Administered 2016-11-23: 650 mg via ORAL
  Filled 2016-11-23: qty 2

## 2016-11-23 MED ORDER — BUPIVACAINE-EPINEPHRINE (PF) 0.25% -1:200000 IJ SOLN
INTRAMUSCULAR | Status: AC
  Filled 2016-11-23: qty 30

## 2016-11-23 MED ORDER — HEPARIN SODIUM (PORCINE) 5000 UNIT/ML IJ SOLN
5000.0000 [IU] | Freq: Once | INTRAMUSCULAR | Status: AC
  Administered 2016-11-23: 5000 [IU] via SUBCUTANEOUS
  Filled 2016-11-23: qty 1

## 2016-11-23 MED ORDER — OXYCODONE-ACETAMINOPHEN 5-325 MG/5ML PO SOLN: 5.0000 mL | Freq: Four times a day (QID) | ORAL | 0 refills | Status: DC | PRN

## 2016-11-23 MED ORDER — CEFAZOLIN SODIUM-DEXTROSE 2-4 GM/100ML-% IV SOLN
2.0000 g | INTRAVENOUS | Status: DC
  Filled 2016-11-23: qty 100

## 2016-11-23 MED ORDER — LACTATED RINGERS IV SOLN
INTRAVENOUS | Status: DC
Start: 2016-11-23 — End: 2016-11-23
  Administered 2016-11-23 (×3): via INTRAVENOUS

## 2016-11-23 MED ORDER — BUPIVACAINE LIPOSOME 1.3 % IJ SUSP
INTRAMUSCULAR | Status: DC | PRN
  Administered 2016-11-23: 20 mL

## 2016-11-23 MED ORDER — DEXAMETHASONE SODIUM PHOSPHATE 10 MG/ML IJ SOLN
INTRAMUSCULAR | Status: DC | PRN
  Administered 2016-11-23: 10 mg via INTRAVENOUS

## 2016-11-23 MED ORDER — SODIUM CHLORIDE 0.9% FLUSH: 3.0000 mL | Freq: Two times a day (BID) | INTRAVENOUS | Status: DC

## 2016-11-23 MED ORDER — MIDAZOLAM HCL 5 MG/5ML IJ SOLN
INTRAMUSCULAR | Status: DC | PRN
  Administered 2016-11-23: 2 mg via INTRAVENOUS

## 2016-11-23 MED ORDER — ONDANSETRON HCL 4 MG/2ML IJ SOLN
INTRAMUSCULAR | Status: AC
  Filled 2016-11-23: qty 2

## 2016-11-23 MED ORDER — IOPAMIDOL (ISOVUE-300) INJECTION 61%
INTRAVENOUS | Status: DC | PRN
  Administered 2016-11-23: 50 mL via INTRAVENOUS

## 2016-11-23 MED ORDER — SUGAMMADEX SODIUM 500 MG/5ML IV SOLN
INTRAVENOUS | Status: DC | PRN
  Administered 2016-11-23: 124.2 mg via INTRAVENOUS

## 2016-11-23 MED ORDER — PROPOFOL 10 MG/ML IV BOLUS
INTRAVENOUS | Status: DC | PRN
  Administered 2016-11-23: 120 mg via INTRAVENOUS

## 2016-11-23 MED ORDER — LACTATED RINGERS IR SOLN
Status: DC | PRN
  Administered 2016-11-23: 1000 mL

## 2016-11-23 MED ORDER — ROCURONIUM BROMIDE 50 MG/5ML IV SOSY
PREFILLED_SYRINGE | INTRAVENOUS | Status: AC
  Filled 2016-11-23: qty 5

## 2016-11-23 MED ORDER — LIDOCAINE HCL (CARDIAC) 20 MG/ML IV SOLN
INTRAVENOUS | Status: DC | PRN
Start: 2016-11-23 — End: 2016-11-23
  Administered 2016-11-23: 80 mg via INTRAVENOUS

## 2016-11-23 MED ORDER — ACETAMINOPHEN 500 MG PO TABS
1000.0000 mg | ORAL_TABLET | ORAL | Status: AC
  Administered 2016-11-23: 1000 mg via ORAL
  Filled 2016-11-23: qty 2

## 2016-11-23 MED ORDER — FENTANYL CITRATE (PF) 100 MCG/2ML IJ SOLN
INTRAMUSCULAR | Status: AC
  Filled 2016-11-23: qty 2

## 2016-11-23 MED ORDER — ACETAMINOPHEN 650 MG RE SUPP
650.0000 mg | RECTAL | Status: DC | PRN
  Filled 2016-11-23: qty 1

## 2016-11-23 MED ORDER — CEFAZOLIN SODIUM-DEXTROSE 2-3 GM-% IV SOLR
INTRAVENOUS | Status: DC | PRN
  Administered 2016-11-23: 2 g via INTRAVENOUS

## 2016-11-23 MED ORDER — ESMOLOL HCL 100 MG/10ML IV SOLN
INTRAVENOUS | Status: DC | PRN
Start: 2016-11-23 — End: 2016-11-23
  Administered 2016-11-23: 30 mg via INTRAVENOUS

## 2016-11-23 MED ORDER — ROCURONIUM BROMIDE 100 MG/10ML IV SOLN
INTRAVENOUS | Status: DC | PRN
  Administered 2016-11-23: 50 mg via INTRAVENOUS

## 2016-11-23 MED ORDER — SUCCINYLCHOLINE CHLORIDE 200 MG/10ML IV SOSY
PREFILLED_SYRINGE | INTRAVENOUS | Status: AC
  Filled 2016-11-23: qty 10

## 2016-11-23 MED ORDER — MIDAZOLAM HCL 2 MG/2ML IJ SOLN
INTRAMUSCULAR | Status: AC
  Filled 2016-11-23: qty 2

## 2016-11-23 MED ORDER — CHLORHEXIDINE GLUCONATE CLOTH 2 % EX PADS: 6.0000 | MEDICATED_PAD | Freq: Once | CUTANEOUS | Status: DC

## 2016-11-23 MED ORDER — LIDOCAINE 2% (20 MG/ML) 5 ML SYRINGE
INTRAMUSCULAR | Status: AC
  Filled 2016-11-23: qty 5

## 2016-11-23 MED ORDER — GABAPENTIN 300 MG PO CAPS
300.0000 mg | ORAL_CAPSULE | ORAL | Status: AC
  Administered 2016-11-23: 300 mg via ORAL
  Filled 2016-11-23: qty 1

## 2016-11-23 MED ORDER — CELECOXIB 200 MG PO CAPS
400.0000 mg | ORAL_CAPSULE | ORAL | Status: AC
  Administered 2016-11-23: 400 mg via ORAL
  Filled 2016-11-23: qty 2

## 2016-11-23 MED ORDER — IOPAMIDOL (ISOVUE-300) INJECTION 61%
INTRAVENOUS | Status: AC
  Filled 2016-11-23: qty 50

## 2016-11-23 MED ORDER — PROPOFOL 10 MG/ML IV BOLUS
INTRAVENOUS | Status: AC
  Filled 2016-11-23: qty 20

## 2016-11-23 MED ORDER — SCOPOLAMINE 1 MG/3DAYS TD PT72
MEDICATED_PATCH | TRANSDERMAL | Status: AC
  Filled 2016-11-23: qty 1

## 2016-11-23 MED ORDER — SUGAMMADEX SODIUM 200 MG/2ML IV SOLN
INTRAVENOUS | Status: AC
  Filled 2016-11-23: qty 2

## 2016-11-23 MED ORDER — HYDROMORPHONE HCL-NACL 0.5-0.9 MG/ML-% IV SOSY: 0.2500 mg | PREFILLED_SYRINGE | INTRAVENOUS | Status: DC | PRN

## 2016-11-23 MED ORDER — FENTANYL CITRATE (PF) 100 MCG/2ML IJ SOLN
INTRAMUSCULAR | Status: DC | PRN
  Administered 2016-11-23 (×6): 50 ug via INTRAVENOUS

## 2016-11-23 SURGICAL SUPPLY — 40 items
ADH SKN CLS APL DERMABOND .7 (GAUZE/BANDAGES/DRESSINGS) ×1
APL SKNCLS STERI-STRIP NONHPOA (GAUZE/BANDAGES/DRESSINGS)
APPLICATOR COTTON TIP 6IN STRL (MISCELLANEOUS) ×4 IMPLANT
APPLIER CLIP ROT 10 11.4 M/L (STAPLE) ×3
APR CLP MED LRG 11.4X10 (STAPLE) ×1
BAG SPEC RTRVL 10 TROC 200 (ENDOMECHANICALS) ×1
BENZOIN TINCTURE PRP APPL 2/3 (GAUZE/BANDAGES/DRESSINGS) IMPLANT
CABLE HIGH FREQUENCY MONO STRZ (ELECTRODE) ×3 IMPLANT
CATH REDDICK CHOLANGI 4FR 50CM (CATHETERS) ×3 IMPLANT
CLIP APPLIE ROT 10 11.4 M/L (STAPLE) ×1 IMPLANT
CLOSURE WOUND 1/2 X4 (GAUZE/BANDAGES/DRESSINGS)
COVER MAYO STAND STRL (DRAPES) ×3 IMPLANT
COVER SURGICAL LIGHT HANDLE (MISCELLANEOUS) ×3 IMPLANT
DECANTER SPIKE VIAL GLASS SM (MISCELLANEOUS) ×1 IMPLANT
DERMABOND ADVANCED (GAUZE/BANDAGES/DRESSINGS) ×2
DERMABOND ADVANCED .7 DNX12 (GAUZE/BANDAGES/DRESSINGS) ×1 IMPLANT
DRAPE C-ARM 42X120 X-RAY (DRAPES) ×3 IMPLANT
ELECT PENCIL ROCKER SW 15FT (MISCELLANEOUS) IMPLANT
ELECT REM PT RETURN 15FT ADLT (MISCELLANEOUS) ×3 IMPLANT
GLOVE BIOGEL M 8.0 STRL (GLOVE) ×3 IMPLANT
GOWN STRL REUS W/TWL XL LVL3 (GOWN DISPOSABLE) ×9 IMPLANT
HEMOSTAT SURGICEL 4X8 (HEMOSTASIS) IMPLANT
IV CATH 14GX2 1/4 (CATHETERS) ×3 IMPLANT
KIT BASIN OR (CUSTOM PROCEDURE TRAY) ×3 IMPLANT
L-HOOK LAP DISP 36CM (ELECTROSURGICAL)
LHOOK LAP DISP 36CM (ELECTROSURGICAL) IMPLANT
POUCH RETRIEVAL ECOSAC 10 (ENDOMECHANICALS) IMPLANT
POUCH RETRIEVAL ECOSAC 10MM (ENDOMECHANICALS) ×2
SCISSORS LAP 5X45 EPIX DISP (ENDOMECHANICALS) ×3 IMPLANT
SET IRRIG TUBING LAPAROSCOPIC (IRRIGATION / IRRIGATOR) ×3 IMPLANT
SLEEVE XCEL OPT CAN 5 100 (ENDOMECHANICALS) ×3 IMPLANT
STRIP CLOSURE SKIN 1/2X4 (GAUZE/BANDAGES/DRESSINGS) IMPLANT
SUT MNCRL AB 4-0 PS2 18 (SUTURE) ×3 IMPLANT
SYR 20CC LL (SYRINGE) ×3 IMPLANT
TOWEL OR 17X26 10 PK STRL BLUE (TOWEL DISPOSABLE) ×3 IMPLANT
TRAY LAPAROSCOPIC (CUSTOM PROCEDURE TRAY) ×3 IMPLANT
TROCAR BLADELESS OPT 5 100 (ENDOMECHANICALS) ×3 IMPLANT
TROCAR XCEL BLUNT TIP 100MML (ENDOMECHANICALS) IMPLANT
TROCAR XCEL NON-BLD 11X100MML (ENDOMECHANICALS) ×3 IMPLANT
TUBING INSUF HEATED (TUBING) ×3 IMPLANT

## 2016-11-23 NOTE — Discharge Instructions (Signed)
General Anesthesia, Adult, Care After These instructions provide you with information about caring for yourself after your procedure. Your health care provider may also give you more specific instructions. Your treatment has been planned according to current medical practices, but problems sometimes occur. Call your health care provider if you have any problems or questions after your procedure. What can I expect after the procedure? After the procedure, it is common to have: Vomiting. A sore throat. Mental slowness.  It is common to feel: Nauseous. Cold or shivery. Sleepy. Tired. Sore or achy, even in parts of your body where you did not have surgery.  Follow these instructions at home: For at least 24 hours after the procedure: Do not: Participate in activities where you could fall or become injured. Drive. Use heavy machinery. Drink alcohol. Take sleeping pills or medicines that cause drowsiness. Make important decisions or sign legal documents. Take care of children on your own. Rest. Eating and drinking If you vomit, drink water, juice, or soup when you can drink without vomiting. Drink enough fluid to keep your urine clear or pale yellow. Make sure you have little or no nausea before eating solid foods. Follow the diet recommended by your health care provider. General instructions Have a responsible adult stay with you until you are awake and alert. Return to your normal activities as told by your health care provider. Ask your health care provider what activities are safe for you. Take over-the-counter and prescription medicines only as told by your health care provider. If you smoke, do not smoke without supervision. Keep all follow-up visits as told by your health care provider. This is important. Contact a health care provider if: You continue to have nausea or vomiting at home, and medicines are not helpful. You cannot drink fluids or start eating again. You cannot  urinate after 8-12 hours. You develop a skin rash. You have fever. You have increasing redness at the site of your procedure. Get help right away if: You have difficulty breathing. You have chest pain. You have unexpected bleeding. You feel that you are having a life-threatening or urgent problem. This information is not intended to replace advice given to you by your health care provider. Make sure you discuss any questions you have with your health care provider. Document Released: 06/19/2000 Document Revised: 08/16/2015 Document Reviewed: 02/25/2015 Elsevier Interactive Patient Education  2018 [employer]. Laparoscopic Cholecystectomy Laparoscopic cholecystectomy is surgery to remove the gallbladder. The gallbladder is a pear-shaped organ that lies beneath the liver on the right side of the body. The gallbladder stores bile, which is a fluid that helps the body to digest fats. Cholecystectomy is often done for inflammation of the gallbladder (cholecystitis). This condition is usually caused by a buildup of gallstones (cholelithiasis) in the gallbladder. Gallstones can block the flow of bile, which can result in inflammation and pain. In severe cases, emergency surgery may be required. This procedure is done though small incisions in your abdomen (laparoscopic surgery). A thin scope with a camera (laparoscope) is inserted through one incision. Thin surgical instruments are inserted through the other incisions. In some cases, a laparoscopic procedure may be turned into a type of surgery that is done through a larger incision (open surgery). Tell a health care provider about:  Any allergies you have.  All medicines you are taking, including vitamins, herbs, eye drops, creams, and over-the-counter medicines.  Any problems you or family members have had with anesthetic medicines.  Any blood disorders you have.  Any surgeries you have had.  Any medical conditions you have.  Whether you  are pregnant or may be pregnant. What are the risks? Generally, this is a safe procedure. However, problems may occur, including:  Infection.  Bleeding.  Allergic reactions to medicines.  Damage to other structures or organs.  A stone remaining in the common bile duct. The common bile duct carries bile from the gallbladder into the small intestine.  A bile leak from the cyst duct that is clipped when your gallbladder is removed.  What happens before the procedure? Staying hydrated Follow instructions from your health care provider about hydration, which may include:  Up to 2 hours before the procedure - you may continue to drink clear liquids, such as water, clear fruit juice, black coffee, and plain tea.  Eating and drinking restrictions Follow instructions from your health care provider about eating and drinking, which may include:  8 hours before the procedure - stop eating heavy meals or foods such as meat, fried foods, or fatty foods.  6 hours before the procedure - stop eating light meals or foods, such as toast or cereal.  6 hours before the procedure - stop drinking milk or drinks that contain milk.  2 hours before the procedure - stop drinking clear liquids.  Medicines  Ask your health care provider about: ? Changing or stopping your regular medicines. This is especially important if you are taking diabetes medicines or blood thinners. ? Taking medicines such as aspirin and ibuprofen. These medicines can thin your blood. Do not take these medicines before your procedure if your health care provider instructs you not to.  You may be given antibiotic medicine to help prevent infection. General instructions  Let your health care provider know if you develop a cold or an infection before surgery.  Plan to have someone take you home from the hospital or clinic.  Ask your health care provider how your surgical site will be marked or identified. What happens during  the procedure?  To reduce your risk of infection: ? Your health care team will wash or sanitize their hands. ? Your skin will be washed with soap. ? Hair may be removed from the surgical area.  An IV tube may be inserted into one of your veins.  You will be given one or more of the following: ? A medicine to help you relax (sedative). ? A medicine to make you fall asleep (general anesthetic).  A breathing tube will be placed in your mouth.  Your surgeon will make several small cuts (incisions) in your abdomen.  The laparoscope will be inserted through one of the small incisions. The camera on the laparoscope will send images to a TV screen (monitor) in the operating room. This lets your surgeon see inside your abdomen.  Air-like gas will be pumped into your abdomen. This will expand your abdomen to give the surgeon more room to perform the surgery.  Other tools that are needed for the procedure will be inserted through the other incisions. The gallbladder will be removed through one of the incisions.  Your common bile duct may be examined. If stones are found in the common bile duct, they may be removed.  After your gallbladder has been removed, the incisions will be closed with stitches (sutures), staples, or skin glue.  Your incisions may be covered with a bandage (dressing). The procedure may vary among health care providers and hospitals. What happens after the procedure?  Your blood pressure, heart  rate, breathing rate, and blood oxygen level will be monitored until the medicines you were given have worn off.  You will be given medicines as needed to control your pain.  Do not drive for 24 hours if you were given a sedative. This information is not intended to replace advice given to you by your health care provider. Make sure you discuss any questions you have with your health care provider. Document Released: 03/13/2005 Document Revised: 10/03/2015 Document Reviewed:  08/30/2015 Elsevier Interactive Patient Education  2018 [employer].

## 2016-11-23 NOTE — H&P (Signed)
Stacey Riddle Location: [facility] Surgery Patient #: [id number] DOB: July 18, 1965 Married / Language: English / Race: Black or African American Female  CC:  Upper abdominal pain and gallstones  History of Present Illness  The patient is a 51 year old female who presents for evaluation of gall stones. She has been waiting for an appointment for 6 weeks. She has been having pain for several months. Dr. Jacelyn Grip ordered an ultrasound which showed gallstones without evidence of cholecystitis. I gave her a lap chole book and explained the procedure in detail. I discussed the complications not limited to CBD injury bile leaks and bowel injuries. She wants to proceed with surgery.      Allergies  Clarithromycin ER *MACROLIDES*  Allergies Reconciled   Medication History  Losartan Potassium (25MG  Tablet, Oral) Active. Potassium Chloride Crys ER (20MEQ Tablet ER, Oral) Active. Bydureon (2MG  Pen-injector, Subcutaneous) Active. Multiple Vitamins w/Minerals (Oral) Active. Medications Reconciled  Vitals  Weight: 135.4 lb Height: 62in Body Surface Area: 1.62 m Body Mass Index: 24.76 kg/m  Temp.: 97.94F  Pulse: 94 (Regular)  P.OX: 99% (Room air) BP: 140/88 (Sitting, Left Arm, Standard)  PE General Note: WDWN AAF NAD Wt after lapband surgery is 135 lbs. HEENT unremarkable Neck supple Chest clear Heart SR without murmurs Abdomen nontender; port in the right upper quadrant. Ext FROM     Assessment & Plan : GALLSTONES (K80.20) Impression: Plan laparoscopic cholecystectomy. Informed consent obtained. HISTORY OF LAPAROSCOPIC ADJUSTABLE GASTRIC BANDING (Z98.84)  Matt B. Hassell Done, MD, FACS

## 2016-11-23 NOTE — Anesthesia Postprocedure Evaluation (Signed)
Anesthesia Post Note  Patient: Stacey Riddle  Procedure(s) Performed: Procedure(s) (LRB): LAPAROSCOPIC CHOLECYSTECTOMY WITH INTRAOPERATIVE CHOLANGIOGRAM (N/A)     Patient location during evaluation: PACU Anesthesia Type: General Level of consciousness: awake and alert Pain management: pain level controlled Vital Signs Assessment: post-procedure vital signs reviewed and stable Respiratory status: spontaneous breathing, nonlabored ventilation, respiratory function stable and patient connected to nasal cannula oxygen Cardiovascular status: blood pressure returned to baseline and stable Postop Assessment: no signs of nausea or vomiting Anesthetic complications: no    Last Vitals:  Vitals:   11/23/16 1220 11/23/16 1245  BP: (!) 164/91 (!) 178/84  Pulse: 68 62  Resp: 16 16  Temp: (!) 36.4 C 36.7 C  SpO2: 100% 100%    Last Pain:  Vitals:   11/23/16 1245  TempSrc: Oral  PainSc: 0-No pain                 Gibril Mastro EDWARD

## 2016-11-23 NOTE — Anesthesia Procedure Notes (Signed)
Procedure Name: Intubation Date/Time: 11/23/2016 9:27 AM Performed by: Pilar Grammes Pre-anesthesia Checklist: Patient identified, Emergency Drugs available, Suction available and Patient being monitored Patient Re-evaluated:Patient Re-evaluated prior to induction Oxygen Delivery Method: Circle system utilized Preoxygenation: Pre-oxygenation with 100% oxygen Induction Type: IV induction Ventilation: Mask ventilation without difficulty Laryngoscope Size: Miller and 2 Grade View: Grade I Tube type: Oral Tube size: 7.0 mm Number of attempts: 1 Airway Equipment and Method: Stylet Placement Confirmation: ETT inserted through vocal cords under direct vision,  positive ETCO2 and breath sounds checked- equal and bilateral Secured at: 20 cm Tube secured with: Tape Dental Injury: Teeth and Oropharynx as per pre-operative assessment

## 2016-11-23 NOTE — Op Note (Signed)
Saysha Chrisp-Doyle   11/23/2016  11:04 AM  Procedure: Laparoscopic Cholecystectomy with intraoperative cholangiogram  Surgeon: Catalina Antigua B. Hassell Done, MD, FACS Asst:  none  Anes:  General  Drains:  None  Findings: Chronic cholecystitis with normal IOC  Description of Procedure: The patient was taken to OR 1 and given general anesthesia.  The patient was prepped with Technicare and draped sterilely. A time out was performed.  Access to the abdomen was achieved with a 5 mm Optiview through the right upper quadrant.  Port placement included three 5 mm ports and one 11 in the upper midline.    The gallbladder was visualized and the fundus was grasped and the gallbladder was elevated after adhesions were taken down from the surrounding omentum to the fundus.  Adhesions were noted to the infundibulum and to the duodenum and these were taken down with the scissors. Traction on the infundibulum allowed for successful demonstration of the critical view. Inflammatory changes were chronic and notable.  The cystic duct was identified and clipped up on the gallbladder and an incision was made in the cystic duct and the Reddick catheter was inserted after milking the cystic duct of any debris. A dynamic cholangiogram was performed which demonstrated intrahepatic filling and free flow into the duodenum.    The cystic duct was then triple clipped and divided, the cystic artery was double clipped and divided and then the gallbladder was removed from the gallbladder bed. Removal of the gallbladder from the gallbladder bed was performed with the hook electrocautery without entering the GB.  The gallbladder was then placed in a bag and brought out through one of the trocar sites. The gallbladder bed was inspected and no bleeding or bile leaks were seen. The 11 mm was placed obliquely and did not require enlargement.  Wounds ere injected with Exparel and closed with 4-0 Monocryl.  Sponge and needle count were correct.     The patient was taken to the recovery room in satisfactory condition.

## 2016-11-23 NOTE — Interval H&P Note (Signed)
History and Physical Interval Note:  11/23/2016 9:18 AM  Stacey Riddle  has presented today for surgery, with the diagnosis of GALLSTONES  The various methods of treatment have been discussed with the patient and family. After consideration of risks, benefits and other options for treatment, the patient has consented to  Procedure(s): LAPAROSCOPIC CHOLECYSTECTOMY WITH INTRAOPERATIVE CHOLANGIOGRAM (N/A) as a surgical intervention .  The patient's history has been reviewed, patient examined, no change in status, stable for surgery.  I have reviewed the patient's chart and labs.  Questions were answered to the patient's satisfaction.     Khup Sapia B

## 2016-11-23 NOTE — Transfer of Care (Signed)
Immediate Anesthesia Transfer of Care Note  Patient: Stacey Riddle  Procedure(s) Performed: Procedure(s): LAPAROSCOPIC CHOLECYSTECTOMY WITH INTRAOPERATIVE CHOLANGIOGRAM (N/A)  Patient Location: PACU  Anesthesia Type:General  Level of Consciousness: drowsy, patient cooperative and responds to stimulation  Airway & Oxygen Therapy: Patient connected to face mask oxygen  Post-op Assessment: Report given to RN and Post -op Vital signs reviewed and stable  Post vital signs: stable  Last Vitals:  Vitals:   11/23/16 0714  BP: 138/88  Pulse: 71  Resp: 18  Temp: 36.8 C  SpO2: 100%    Last Pain:  Vitals:   11/23/16 0714  TempSrc: Oral      Patients Stated Pain Goal: 4 (85/99/23 4144)  Complications: No apparent anesthesia complications

## 2017-01-05 DIAGNOSIS — N200 Calculus of kidney: Secondary | ICD-10-CM | POA: Diagnosis not present

## 2017-01-08 ENCOUNTER — Other Ambulatory Visit: Payer: Self-pay | Admitting: Surgery

## 2017-01-08 DIAGNOSIS — Z9884 Bariatric surgery status: Secondary | ICD-10-CM

## 2017-01-11 ENCOUNTER — Ambulatory Visit
Admission: RE | Admit: 2017-01-11 | Discharge: 2017-01-11 | Disposition: A | Discharge status: Home / Self Care | Payer: 59 | Source: Ambulatory Visit | Attending: Surgery | Admitting: Surgery

## 2017-01-11 DIAGNOSIS — K449 Diaphragmatic hernia without obstruction or gangrene: Secondary | ICD-10-CM | POA: Diagnosis not present

## 2017-01-11 DIAGNOSIS — Z9884 Bariatric surgery status: Secondary | ICD-10-CM

## 2017-01-16 DIAGNOSIS — N200 Calculus of kidney: Secondary | ICD-10-CM | POA: Diagnosis not present

## 2017-01-19 ENCOUNTER — Other Ambulatory Visit: Payer: Self-pay | Admitting: Urology

## 2017-01-22 ENCOUNTER — Encounter (HOSPITAL_BASED_OUTPATIENT_CLINIC_OR_DEPARTMENT_OTHER): Payer: Self-pay | Admitting: *Deleted

## 2017-01-22 NOTE — Progress Notes (Signed)
NPO AFTER MN.  ARRIVE AT 0915.  NEEDS ISTAT 8.  CURRENT EKG IN CHART AND EPIC.  WILL TAKE PRILOSEC AM DOS W/ SIPS OF WATER AND IF NEEDED TAKE HYDROCODONE.

## 2017-01-29 ENCOUNTER — Encounter (HOSPITAL_BASED_OUTPATIENT_CLINIC_OR_DEPARTMENT_OTHER): Payer: Self-pay | Admitting: *Deleted

## 2017-01-29 ENCOUNTER — Ambulatory Visit (HOSPITAL_BASED_OUTPATIENT_CLINIC_OR_DEPARTMENT_OTHER)
Admission: RE | Admit: 2017-01-29 | Discharge: 2017-01-29 | Disposition: A | Discharge status: Home / Self Care | Payer: 59 | Source: Ambulatory Visit | Attending: Urology | Admitting: Urology

## 2017-01-29 ENCOUNTER — Ambulatory Visit (HOSPITAL_BASED_OUTPATIENT_CLINIC_OR_DEPARTMENT_OTHER): Payer: 59 | Admitting: Anesthesiology

## 2017-01-29 ENCOUNTER — Encounter (HOSPITAL_BASED_OUTPATIENT_CLINIC_OR_DEPARTMENT_OTHER)
Admission: RE | Disposition: A | Discharge status: Home / Self Care | Payer: Self-pay | Source: Ambulatory Visit | Attending: Urology

## 2017-01-29 DIAGNOSIS — E119 Type 2 diabetes mellitus without complications: Secondary | ICD-10-CM | POA: Insufficient documentation

## 2017-01-29 DIAGNOSIS — I10 Essential (primary) hypertension: Secondary | ICD-10-CM | POA: Diagnosis not present

## 2017-01-29 DIAGNOSIS — N202 Calculus of kidney with calculus of ureter: Secondary | ICD-10-CM | POA: Insufficient documentation

## 2017-01-29 DIAGNOSIS — K219 Gastro-esophageal reflux disease without esophagitis: Secondary | ICD-10-CM | POA: Diagnosis not present

## 2017-01-29 DIAGNOSIS — N201 Calculus of ureter: Secondary | ICD-10-CM | POA: Diagnosis not present

## 2017-01-29 DIAGNOSIS — Z841 Family history of disorders of kidney and ureter: Secondary | ICD-10-CM | POA: Diagnosis not present

## 2017-01-29 DIAGNOSIS — Z79899 Other long term (current) drug therapy: Secondary | ICD-10-CM | POA: Diagnosis not present

## 2017-01-29 DIAGNOSIS — N200 Calculus of kidney: Secondary | ICD-10-CM

## 2017-01-29 HISTORY — PX: CYSTOSCOPY WITH RETROGRADE PYELOGRAM, URETEROSCOPY AND STENT PLACEMENT: SHX5789

## 2017-01-29 HISTORY — DX: Calculus of ureter: N20.1

## 2017-01-29 HISTORY — PX: HOLMIUM LASER APPLICATION: SHX5852

## 2017-01-29 HISTORY — DX: Presence of spectacles and contact lenses: Z97.3

## 2017-01-29 HISTORY — DX: Personal history of other diseases of the female genital tract: Z87.42

## 2017-01-29 HISTORY — DX: Personal history of cervical dysplasia: Z87.410

## 2017-01-29 HISTORY — DX: Calculus of kidney: N20.0

## 2017-01-29 HISTORY — DX: Type 2 diabetes mellitus without complications: E11.9

## 2017-01-29 HISTORY — DX: Personal history of urinary calculi: Z87.442

## 2017-01-29 HISTORY — DX: Gastro-esophageal reflux disease without esophagitis: K21.9

## 2017-01-29 HISTORY — DX: Personal history of other diseases of the digestive system: Z87.19

## 2017-01-29 HISTORY — DX: Bariatric surgery status: Z98.84

## 2017-01-29 LAB — POCT I-STAT, CHEM 8
BUN: 4 mg/dL — AB (ref 6–20)
BUN: 4 mg/dL — AB (ref 6–20)
CALCIUM ION: 1.25 mmol/L (ref 1.15–1.40)
CHLORIDE: 107 mmol/L (ref 101–111)
CHLORIDE: 106 mmol/L (ref 101–111)
CREATININE: 0.7 mg/dL (ref 0.44–1.00)
Calcium, Ion: 1.27 mmol/L (ref 1.15–1.40)
Creatinine, Ser: 0.8 mg/dL (ref 0.44–1.00)
GLUCOSE: 95 mg/dL (ref 65–99)
Glucose, Bld: 98 mg/dL (ref 65–99)
HEMATOCRIT: 38 % (ref 36.0–46.0)
HEMATOCRIT: 37 % (ref 36.0–46.0)
Hemoglobin: 12.9 g/dL (ref 12.0–15.0)
Hemoglobin: 12.6 g/dL (ref 12.0–15.0)
POTASSIUM: 2.7 mmol/L — AB (ref 3.5–5.1)
Potassium: 2.8 mmol/L — ABNORMAL LOW (ref 3.5–5.1)
SODIUM: 144 mmol/L (ref 135–145)
Sodium: 145 mmol/L (ref 135–145)
TCO2: 22 mmol/L (ref 22–32)
TCO2: 23 mmol/L (ref 22–32)

## 2017-01-29 LAB — BASIC METABOLIC PANEL
ANION GAP: 8 (ref 5–15)
BUN: 6 mg/dL (ref 6–20)
CALCIUM: 8.8 mg/dL — AB (ref 8.9–10.3)
CO2: 23 mmol/L (ref 22–32)
CREATININE: 0.77 mg/dL (ref 0.44–1.00)
Chloride: 108 mmol/L (ref 101–111)
GFR calc Af Amer: >60 mL/min (ref >60)
GLUCOSE: 101 mg/dL — AB (ref 65–99)
Potassium: 2.7 mmol/L — CL (ref 3.5–5.1)
Sodium: 139 mmol/L (ref 135–145)

## 2017-01-29 LAB — GLUCOSE, CAPILLARY: Glucose-Capillary: 88 mg/dL (ref 65–99)

## 2017-01-29 SURGERY — CYSTOURETEROSCOPY, WITH RETROGRADE PYELOGRAM AND STENT INSERTION
Anesthesia: General | Site: Ureter | Laterality: Left

## 2017-01-29 MED ORDER — LIDOCAINE 2% (20 MG/ML) 5 ML SYRINGE
INTRAMUSCULAR | Status: DC | PRN
  Administered 2017-01-29: 60 mg via INTRAVENOUS

## 2017-01-29 MED ORDER — PROMETHAZINE HCL 25 MG/ML IJ SOLN
6.2500 mg | INTRAMUSCULAR | Status: DC | PRN
  Filled 2017-01-29: qty 1

## 2017-01-29 MED ORDER — HYDROMORPHONE HCL 1 MG/ML IJ SOLN
0.2500 mg | INTRAMUSCULAR | Status: DC | PRN
  Filled 2017-01-29: qty 0.5

## 2017-01-29 MED ORDER — MIDAZOLAM HCL 5 MG/5ML IJ SOLN
INTRAMUSCULAR | Status: DC | PRN
  Administered 2017-01-29: 2 mg via INTRAVENOUS

## 2017-01-29 MED ORDER — IOHEXOL 300 MG/ML  SOLN
INTRAMUSCULAR | Status: DC | PRN
  Administered 2017-01-29: 30 mL

## 2017-01-29 MED ORDER — SODIUM CHLORIDE 0.9 % IR SOLN
Status: DC | PRN
  Administered 2017-01-29: 1000 mL
  Administered 2017-01-29: 3000 mL

## 2017-01-29 MED ORDER — LACTATED RINGERS IV SOLN
INTRAVENOUS | Status: DC
  Administered 2017-01-29: 11:00:00 via INTRAVENOUS
  Filled 2017-01-29: qty 1000

## 2017-01-29 MED ORDER — DEXAMETHASONE SODIUM PHOSPHATE 10 MG/ML IJ SOLN
INTRAMUSCULAR | Status: AC
  Filled 2017-01-29: qty 1

## 2017-01-29 MED ORDER — MIDAZOLAM HCL 2 MG/2ML IJ SOLN
INTRAMUSCULAR | Status: AC
  Filled 2017-01-29: qty 2

## 2017-01-29 MED ORDER — DEXAMETHASONE SODIUM PHOSPHATE 4 MG/ML IJ SOLN
INTRAMUSCULAR | Status: DC | PRN
  Administered 2017-01-29: 10 mg via INTRAVENOUS

## 2017-01-29 MED ORDER — LIDOCAINE 2% (20 MG/ML) 5 ML SYRINGE
INTRAMUSCULAR | Status: AC
  Filled 2017-01-29: qty 5

## 2017-01-29 MED ORDER — ONDANSETRON HCL 4 MG/2ML IJ SOLN
INTRAMUSCULAR | Status: AC
  Filled 2017-01-29: qty 2

## 2017-01-29 MED ORDER — POTASSIUM CHLORIDE ER 10 MEQ PO TBCR
20.0000 meq | EXTENDED_RELEASE_TABLET | Freq: Once | ORAL | Status: AC
  Administered 2017-01-29: 20 meq via ORAL
  Filled 2017-01-29 (×2): qty 2

## 2017-01-29 MED ORDER — CEFAZOLIN SODIUM-DEXTROSE 2-4 GM/100ML-% IV SOLN
2.0000 g | INTRAVENOUS | Status: AC
  Administered 2017-01-29: 2 g via INTRAVENOUS
  Filled 2017-01-29: qty 100

## 2017-01-29 MED ORDER — ONDANSETRON HCL 4 MG/2ML IJ SOLN
INTRAMUSCULAR | Status: DC | PRN
  Administered 2017-01-29: 4 mg via INTRAVENOUS

## 2017-01-29 MED ORDER — FENTANYL CITRATE (PF) 100 MCG/2ML IJ SOLN
INTRAMUSCULAR | Status: AC
  Filled 2017-01-29: qty 2

## 2017-01-29 MED ORDER — PROPOFOL 10 MG/ML IV BOLUS
INTRAVENOUS | Status: DC | PRN
  Administered 2017-01-29: 150 mg via INTRAVENOUS

## 2017-01-29 MED ORDER — HYDROCODONE-ACETAMINOPHEN 5-325 MG PO TABS
1.0000 | ORAL_TABLET | ORAL | Status: DC | PRN
  Administered 2017-01-29: 1 via ORAL
  Filled 2017-01-29: qty 1

## 2017-01-29 MED ORDER — CEFAZOLIN SODIUM-DEXTROSE 2-4 GM/100ML-% IV SOLN
INTRAVENOUS | Status: AC
  Filled 2017-01-29: qty 100

## 2017-01-29 MED ORDER — FENTANYL CITRATE (PF) 100 MCG/2ML IJ SOLN
INTRAMUSCULAR | Status: DC | PRN
Start: 2017-01-29 — End: 2017-01-29
  Administered 2017-01-29 (×2): 50 ug via INTRAVENOUS

## 2017-01-29 MED ORDER — PROPOFOL 10 MG/ML IV BOLUS
INTRAVENOUS | Status: AC
  Filled 2017-01-29: qty 20

## 2017-01-29 MED ORDER — CEPHALEXIN 500 MG PO CAPS: 500.0000 mg | ORAL_CAPSULE | Freq: Four times a day (QID) | ORAL | 0 refills | Status: DC

## 2017-01-29 MED ORDER — HYDROCODONE-ACETAMINOPHEN 5-325 MG PO TABS: 1.0000 | ORAL_TABLET | ORAL | 0 refills | Status: DC | PRN

## 2017-01-29 MED ORDER — SCOPOLAMINE 1 MG/3DAYS TD PT72
MEDICATED_PATCH | TRANSDERMAL | Status: AC
  Filled 2017-01-29: qty 1

## 2017-01-29 MED ORDER — SCOPOLAMINE 1 MG/3DAYS TD PT72
1.0000 | MEDICATED_PATCH | TRANSDERMAL | Status: DC
  Administered 2017-01-29: 1 via TRANSDERMAL
  Administered 2017-01-29: 1.5 mg via TRANSDERMAL
  Filled 2017-01-29: qty 1

## 2017-01-29 MED ORDER — KETOROLAC TROMETHAMINE 30 MG/ML IJ SOLN
INTRAMUSCULAR | Status: DC | PRN
  Administered 2017-01-29: 30 mg via INTRAVENOUS

## 2017-01-29 MED ORDER — HYDROCODONE-ACETAMINOPHEN 5-325 MG PO TABS
ORAL_TABLET | ORAL | Status: AC
  Filled 2017-01-29: qty 1

## 2017-01-29 SURGICAL SUPPLY — 22 items
BAG DRAIN URO-CYSTO SKYTR STRL (DRAIN) ×3 IMPLANT
BAG DRN UROCATH (DRAIN) ×1
BASKET ZERO TIP NITINOL 2.4FR (BASKET) ×2 IMPLANT
BSKT STON RTRVL ZERO TP 2.4FR (BASKET) ×1
CATH URET 5FR 28IN OPEN ENDED (CATHETERS) ×3 IMPLANT
CLOTH BEACON ORANGE TIMEOUT ST (SAFETY) ×3 IMPLANT
FIBER LASER TRAC TIP (UROLOGICAL SUPPLIES) ×2 IMPLANT
GLOVE BIO SURGEON STRL SZ7.5 (GLOVE) ×3 IMPLANT
GOWN STRL REUS W/ TWL XL LVL3 (GOWN DISPOSABLE) ×1 IMPLANT
GOWN STRL REUS W/TWL XL LVL3 (GOWN DISPOSABLE) ×3
GUIDEWIRE STR DUAL SENSOR (WIRE) ×5 IMPLANT
INFUSOR MANOMETER BAG 3000ML (MISCELLANEOUS) ×3 IMPLANT
IV NS IRRIG 3000ML ARTHROMATIC (IV SOLUTION) ×3 IMPLANT
KIT RM TURNOVER CYSTO AR (KITS) ×3 IMPLANT
MANIFOLD NEPTUNE II (INSTRUMENTS) ×3 IMPLANT
PACK CYSTO (CUSTOM PROCEDURE TRAY) ×3 IMPLANT
SCRUB PCMX 4 OZ (MISCELLANEOUS) ×3 IMPLANT
SHEATH URET ACCESS 12FR/35CM (UROLOGICAL SUPPLIES) ×2 IMPLANT
SHEATH URETERAL 12FRX28CM (UROLOGICAL SUPPLIES) ×2 IMPLANT
STENT URET 6FRX24 CONTOUR (STENTS) ×2 IMPLANT
TUBE CONNECTING 12'X1/4 (SUCTIONS) ×1
TUBE CONNECTING 12X1/4 (SUCTIONS) ×2 IMPLANT

## 2017-01-29 NOTE — Discharge Instructions (Addendum)
CYSTOSCOPY HOME CARE INSTRUCTIONS  Activity: Rest for the remainder of the day.  Do not drive or operate equipment today.  You may resume normal activities in one to two days as instructed by your physician.   Meals: Drink plenty of liquids and eat light foods such as gelatin or soup this evening.  You may return to a normal meal plan tomorrow.  Return to Work: You may return to work in one to two days or as instructed by your physician.  Special Instructions / Symptoms: Call your physician if any of these symptoms occur:   -persistent or heavy bleeding  -bleeding which continues after first few urination  -large blood clots that are difficult to pass  -urine stream diminishes or stops completely  -fever equal to or higher than 101 degrees Farenheit.  -cloudy urine with a strong, foul odor  -severe pain  Females should always wipe from front to back after elimination.  You may feel some burning pain when you urinate.  This should disappear with time.  Applying moist heat to the lower abdomen or a hot tub bath may help relieve the pain. \  Follow-Up / Date of Return Visit to Your Physician:  As instructed Call for an appointment to arrange follow-up.  Patient Signature:  ________________________________________________________  Nurse's Signature:  ________________________________________________________     Post Anesthesia Home Care Instructions  Activity: Get plenty of rest for the remainder of the day. A responsible individual must stay with you for 24 hours following the procedure.  For the next 24 hours, DO NOT: -Drive a car -Paediatric nurse -Drink alcoholic beverages -Take any medication unless instructed by your physician -Make any legal decisions or sign important papers.  Meals: Start with liquid foods such as gelatin or soup. Progress to regular foods as tolerated. Avoid greasy, spicy, heavy foods. If nausea and/or vomiting occur, drink only clear liquids until  the nausea and/or vomiting subsides. Call your physician if vomiting continues.  Special Instructions/Symptoms: Your throat may feel dry or sore from the anesthesia or the breathing tube placed in your throat during surgery. If this causes discomfort, gargle with warm salt water. The discomfort should disappear within 24 hours.  If you had a scopolamine patch placed behind your ear for the management of post- operative nausea and/or vomiting:  1. The medication in the patch is effective for 72 hours, after which it should be removed.  Wrap patch in a tissue and discard in the trash. Wash hands thoroughly with soap and water. 2. You may remove the patch earlier than 72 hours if you experience unpleasant side effects which may include dry mouth, dizziness or visual disturbances. 3. Avoid touching the patch. Wash your hands with soap and water after contact with the patch.   Verbal order-will not need to take Keflex per Rx

## 2017-01-29 NOTE — Transfer of Care (Signed)
  Last Vitals:  Vitals:   01/29/17 0918  BP: (!) 159/78  Pulse: 66  Resp: 18  Temp: 36.6 C    Last Pain:  Vitals:   01/29/17 0918  TempSrc: Oral      Patients Stated Pain Goal: 8 (01/29/17 0939) Immediate Anesthesia Transfer of Care Note  Patient: Azile Chrisp-Doyle  Procedure(s) Performed: Procedure(s) (LRB): CYSTOSCOPY WITH RETROGRADE PYELOGRAM, URETEROSCOPY, LASER LITHOTRIPSY, STONE BASKETRY,  STENT PLACEMENT (Left) HOLMIUM LASER APPLICATION (Left)  Patient Location: PACU  Anesthesia Type: General  Level of Consciousness: awake, alert  and oriented  Airway & Oxygen Therapy: Patient Spontanous Breathing and Patient connected to nasal cannula oxygen  Post-op Assessment: Report given to PACU RN and Post -op Vital signs reviewed and stable  Post vital signs: Reviewed and stable  Complications: No apparent anesthesia complications

## 2017-01-29 NOTE — Anesthesia Procedure Notes (Signed)
Procedure Name: LMA Insertion Date/Time: 01/29/2017 12:18 PM Performed by: Duane Boston, MD Pre-anesthesia Checklist: Patient identified, Emergency Drugs available, Suction available and Patient being monitored Patient Re-evaluated:Patient Re-evaluated prior to induction Oxygen Delivery Method: Circle system utilized Preoxygenation: Pre-oxygenation with 100% oxygen Induction Type: IV induction Ventilation: Mask ventilation without difficulty LMA: LMA inserted LMA Size: 4.0 Number of attempts: 1 Airway Equipment and Method: Bite block Placement Confirmation: positive ETCO2 Tube secured with: Tape Dental Injury: Teeth and Oropharynx as per pre-operative assessment

## 2017-01-29 NOTE — Anesthesia Preprocedure Evaluation (Signed)
Anesthesia Evaluation  Patient identified by MRN, date of birth, ID band Patient awake    Reviewed: Allergy & Precautions, H&P , Patient's Chart, lab work & pertinent test results, reviewed documented beta blocker date and time   History of Anesthesia Complications Negative for: history of anesthetic complications  Airway Mallampati: II  TM Distance: >3 FB Neck ROM: full    Dental no notable dental hx. (+) Chipped,    Pulmonary neg pulmonary ROS,    Pulmonary exam normal breath sounds clear to auscultation       Cardiovascular hypertension, Pt. on medications  Rhythm:regular Rate:Normal     Neuro/Psych negative neurological ROS  negative psych ROS   GI/Hepatic Neg liver ROS, hiatal hernia, GERD  Medicated,  Endo/Other  diabetes  Renal/GU      Musculoskeletal   Abdominal   Peds  Hematology   Anesthesia Other Findings Bronchitis    Anemia     Diabetes mellitus      Reproductive/Obstetrics                             Anesthesia Physical  Anesthesia Plan  ASA: II  Anesthesia Plan: General   Post-op Pain Management:    Induction: Intravenous  PONV Risk Score and Plan: 3 and Ondansetron, Dexamethasone, Treatment may vary due to age or medical condition and Scopolamine patch - Pre-op  Airway Management Planned: LMA  Additional Equipment:   Intra-op Plan:   Post-operative Plan: Extubation in OR  Informed Consent: I have reviewed the patients History and Physical, chart, labs and discussed the procedure including the risks, benefits and alternatives for the proposed anesthesia with the patient or authorized representative who has indicated his/her understanding and acceptance.   Dental Advisory Given  Plan Discussed with: CRNA and Surgeon  Anesthesia Plan Comments: (  )        Anesthesia Quick Evaluation

## 2017-01-29 NOTE — H&P (Signed)
CC: I have kidney stones.  HPI: Stacey Riddle is a 51 year-old female established patient who is here for renal calculi. C.T imaging on 01/05/17 noted an approximatly 4 mm X 6 mm left proximal ureteral stone. She also has non obstructing stone in the left kidney. She also complains of some increased urinary urgency and frequency. She denies gross hematuria, dysuria, or fever.   The problem is on the left side. She is not currently having flank pain, back pain, groin pain, nausea, vomiting, fever or chills. She has not caught a stone in her urine strainer since her symptoms began.   She has never had surgical treatment for calculi in the past.     ALLERGIES: Biaxin    MEDICATIONS: Losartan Potassium 25 mg tablet  Potassium Chloride 20 meq tablet, ext release, particles/crystals     GU PSH: None   NON-GU PSH: Carpal tunnel surgery, Bilateral - 2000 Cesarean Delivery - 2006 Lap, Place Gastr Adjust Band - 2009    GU PMH: Renal calculus - 10/30/2016    NON-GU PMH: Diabetes Type 2 GERD Hypertension    FAMILY HISTORY: 1 Daughter - Daughter Diabetes - Father, Sister Glaucoma - Sister Heart Disease - Sister, Father renal failure - Sister   SOCIAL HISTORY: Marital Status: Married Preferred Language: English; Ethnicity: Not Hispanic Or Latino; Race: Black or African American Current Smoking Status: Patient has never smoked.   Tobacco Use Assessment Completed: Used Tobacco in last 30 days? Does not drink anymore.  Drinks 4+ caffeinated drinks per day. 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    REVIEW OF SYSTEMS:    GU Review Female:   Patient denies frequent urination, hard to postpone urination, burning /pain with urination, get up at night to urinate, leakage of urine, stream starts and stops, trouble starting your stream, have to strain to urinate, and being pregnant.  Gastrointestinal (Upper):   Patient denies nausea, vomiting, and indigestion/ heartburn.   Gastrointestinal (Lower):   Patient denies constipation and diarrhea.  Constitutional:   Patient denies fever, night sweats, weight loss, and fatigue.  Skin:   Patient denies skin rash/ lesion and itching.  Eyes:   Patient denies blurred vision and double vision.  Ears/ Nose/ Throat:   Patient denies sore throat and sinus problems.  Hematologic/Lymphatic:   Patient denies swollen glands and easy bruising.  Cardiovascular:   Patient denies leg swelling and chest pains.  Respiratory:   Patient denies cough and shortness of breath.  Endocrine:   Patient denies excessive thirst.  Musculoskeletal:   Patient denies back pain and joint pain.  Neurological:   Patient denies headaches and dizziness.  Psychologic:   Patient denies depression and anxiety.   VITAL SIGNS:      01/16/2017 11:50 AM  BP 159/89 mmHg  Pulse 71 /min  Temperature 98.6 F / 37 C   MULTI-SYSTEM PHYSICAL EXAMINATION:    Constitutional: Well-nourished. No physical deformities. Normally developed. Good grooming.  Respiratory: Normal breath sounds. No labored breathing, no use of accessory muscles.   Cardiovascular: Regular rate and rhythm. No murmur, no gallop. Normal temperature, no swelling, no varicosities.   Skin: No paleness, no jaundice, no cyanosis. No lesion, no ulcer, no rash.  Neurologic / Psychiatric: Oriented to time, oriented to place, oriented to person. No depression, no anxiety, no agitation.  Gastrointestinal: No mass, no tenderness, no rigidity, non obese abdomen.  Musculoskeletal: Spine, ribs, pelvis no bilateral tenderness. Normal gait and station of head and neck.  PAST DATA REVIEWED:  Source Of History:  Patient  Records Review:   Previous Patient Records  Urine Test Review:   Urinalysis  X-Ray Review: C.T. Stone Protocol: Reviewed Films. Reviewed Report.     PROCEDURES:         KUB - [id number]  A single view of the abdomen is obtained. No obvious opacities noted within the confines of bilateral  renal shadows. I see no obvious opacities noted along the expected anatomical tract of the right ureter. There are some opacities noted along the expected anatomical tract of the left ureter; however, there is prominent overlying bowel gas in this area. I can not say if any of these areas represent her stone. Multiple pelvic calcifications noted. Prominent overlying bowel gas. Lap band noted.                Urinalysis w/Scope - 81001 Dipstick Dipstick Cont'd Micro  Color: Brown Bilirubin: Neg WBC/hpf: NS (Not Seen)  Appearance: Cloudy Ketones: Neg RBC/hpf: >60/hpf  Specific Gravity: 1.025 Blood: 3+ Bacteria: Few (10-25/hpf)  pH: 5.5 Protein: 2+ Cystals: NS (Not Seen)  Glucose: Neg Urobilinogen: 0.2 Casts: NS (Not Seen)    Nitrites: Neg Trichomonas: Not Present    Leukocyte Esterase: Neg Mucous: Not Present      Epithelial Cells: 0 - 5/hpf      Yeast: NS (Not Seen)      Sperm: Not Present    Notes:            Ketoralac 30mg  - [id number], [id number] Qty: 30 Adm. By: Orinda Kenner  Unit: mg Lot No [id number]  Route: IM Exp. Date 05/16/2016  Freq: None Mfgr.:   Site: Left Buttock   ASSESSMENT:      ICD-10 Details  1 GU:   Renal calculus - N20.0    PLAN:            Medications New Meds: Hydrocodone-Acetaminophen 5 mg-325 mg tablet 1 tablet PO Q 6 H PRN   #20  0 Refill(s)            Orders Labs Urine Culture  X-Rays: KUB          Schedule Return Visit/Planned Activity: Other See Visit Notes  Procedure: 01/16/2017 at [facility] - [phone] - Ketoralac 30mg  (Toradol Per 15 Mg) - A9191, 66060          Document Letter(s):  Created for Patient: Clinical Summary         Notes:   Cystoscopy, left URS, laser litho, left ureteral stent for left ureteral/renal stones.

## 2017-01-29 NOTE — Anesthesia Postprocedure Evaluation (Signed)
Anesthesia Post Note  Patient: Maribeth Chrisp-Doyle  Procedure(s) Performed: CYSTOSCOPY WITH RETROGRADE PYELOGRAM, URETEROSCOPY, LASER LITHOTRIPSY, STONE BASKETRY,  STENT PLACEMENT (Left Ureter) HOLMIUM LASER APPLICATION (Left Ureter)     Patient location during evaluation: PACU Anesthesia Type: General Level of consciousness: sedated Pain management: pain level controlled Vital Signs Assessment: post-procedure vital signs reviewed and stable Respiratory status: spontaneous breathing and respiratory function stable Cardiovascular status: stable Postop Assessment: no apparent nausea or vomiting Anesthetic complications: no    Last Vitals:  Vitals:   01/29/17 0918 01/29/17 1304  BP: (!) 159/78 (!) 153/81  Pulse: 66 72  Resp: 18 10  Temp: 36.6 C (!) 36.4 C  SpO2:  100%    Last Pain:  Vitals:   01/29/17 0918  TempSrc: Oral                 Sakai Wolford DANIEL

## 2017-01-29 NOTE — Op Note (Signed)
Date of procedure: 01/29/17  Preoperative diagnosis:  1. Left renal stone 2. Left ureteral stone   Postoperative diagnosis:  1. Left renal stone 2. Passed left ureteral stone   Procedure: 1. Cystoscopy 2. Left ureteroscopy 3. Laser lithotripsy 4. Stone basketing 5. Left ureteral stent placement - 6 Fr by 24 cm    Surgeon: Baruch Gouty, MD  Anesthesia: General  Complications: None  Intraoperative findings: Left ureteral stone has passed on expection.  Left mid renal stone was broken into smaller pieces with laser lithotripsy and removed.  Left retrograde pyelogram and the procedure showed no residual stone burden.  EBL: None  Specimens: Left renal stone to the office lab  Drains: 6 French by 24 cm left double-J ureteral stent  Disposition: Stable to the postanesthesia care unit  Indication for procedure: The patient is a 51 y.o. female with history of left ureteral stone and left renal stone who presents today for definitive surgical management.  After reviewing the management options for treatment, the patient elected to proceed with the above surgical procedure(s). We have discussed the potential benefits and risks of the procedure, side effects of the proposed treatment, the likelihood of the patient achieving the goals of the procedure, and any potential problems that might occur during the procedure or recuperation. Informed consent has been obtained.  Description of procedure: The patient was met in the preoperative area. All risks, benefits, and indications of the procedure were described in great detail. The patient consented to the procedure. Preoperative antibiotics were given. The patient was taken to the operative theater. General anesthesia was induced per the anesthesia service. The patient was then placed in the dorsal lithotomy position and prepped and draped in the usual sterile fashion. A preoperative timeout was called.   A 21 French 30 degree cystoscope was  inserted into the patient's bladder per urethra atraumatically.  A sensor wire was then advanced to the level of the left renal pelvis under fluoroscopy.  A semirigid ureteroscope was advanced to the left ureter and pan ureteroscopy did not reveal the previously seen 4 mm left ureteral stone on previous CT scan.  There is no stones within the ureter.  A second sensor wire was then advanced to the level of the left renal pelvis and the ureteroscope was withdrawn.  Under fluoroscopy a ureteral access sheath was then placed up to the level of the proximal ureter under fluoroscopy.  This was performed over 1 of the sensor wires.  A flexible ureter screw was then inserted into the patient's left collecting system.  On careful inspection the stone was located.  It was in a narrow infundibulum off the mid pole.  He did take a retrograde pyelogram to help identify this area.  It is somewhat difficult to navigate to.  The stone was broken into smaller pieces and removed the stone basket.  This was performed with laser lithotripsy to break up the stone.  At this point no further stone fragments were visualized and pan ureteroscopy.  A left retrograde pyelogram confirmed this with no filling defects.  The ureteral access sheath was then removed under direct visualization showing no significant stone burden within the left ureter.  At this point over the remaining sensor wire 6 French by 24 cm double-J ureteral stent was placed.  Sensor wire was removed.  A Crowe C in the patient's left renal pelvis under fluoroscopy and in the urinary bladder under direct fertilization.  At this point, the patient's bladder was drained.  He was awoken from anesthesia and transferred in stable condition to the PACU.  Plan: The patient will follow up in 1 week for stent removal.  We will need an ultrasound in 1 month to rule out iatrogenic hydronephrosis.  Will discuss stone analysis when available.  Baruch Gouty, M.D.

## 2017-01-30 ENCOUNTER — Encounter (HOSPITAL_BASED_OUTPATIENT_CLINIC_OR_DEPARTMENT_OTHER): Payer: Self-pay | Admitting: Urology

## 2017-02-07 DIAGNOSIS — N200 Calculus of kidney: Secondary | ICD-10-CM | POA: Diagnosis not present

## 2017-03-08 DIAGNOSIS — K210 Gastro-esophageal reflux disease with esophagitis: Secondary | ICD-10-CM | POA: Diagnosis not present

## 2017-03-08 DIAGNOSIS — R05 Cough: Secondary | ICD-10-CM | POA: Diagnosis not present

## 2017-03-08 DIAGNOSIS — J069 Acute upper respiratory infection, unspecified: Secondary | ICD-10-CM | POA: Diagnosis not present

## 2017-04-02 DIAGNOSIS — N200 Calculus of kidney: Secondary | ICD-10-CM | POA: Diagnosis not present

## 2017-04-11 DIAGNOSIS — Z4651 Encounter for fitting and adjustment of gastric lap band: Secondary | ICD-10-CM | POA: Diagnosis not present

## 2017-04-19 DIAGNOSIS — E1165 Type 2 diabetes mellitus with hyperglycemia: Secondary | ICD-10-CM | POA: Diagnosis not present

## 2017-04-19 DIAGNOSIS — I10 Essential (primary) hypertension: Secondary | ICD-10-CM | POA: Diagnosis not present

## 2017-04-19 DIAGNOSIS — M791 Myalgia, unspecified site: Secondary | ICD-10-CM | POA: Diagnosis not present

## 2017-04-19 DIAGNOSIS — E7800 Pure hypercholesterolemia, unspecified: Secondary | ICD-10-CM | POA: Diagnosis not present

## 2017-05-03 DIAGNOSIS — Z4651 Encounter for fitting and adjustment of gastric lap band: Secondary | ICD-10-CM | POA: Diagnosis not present

## 2017-05-07 DIAGNOSIS — Z4651 Encounter for fitting and adjustment of gastric lap band: Secondary | ICD-10-CM | POA: Diagnosis not present

## 2017-05-31 DIAGNOSIS — Z4651 Encounter for fitting and adjustment of gastric lap band: Secondary | ICD-10-CM | POA: Diagnosis not present

## 2017-06-07 DIAGNOSIS — J209 Acute bronchitis, unspecified: Secondary | ICD-10-CM | POA: Diagnosis not present

## 2017-06-07 DIAGNOSIS — R062 Wheezing: Secondary | ICD-10-CM | POA: Diagnosis not present

## 2017-06-07 DIAGNOSIS — E7800 Pure hypercholesterolemia, unspecified: Secondary | ICD-10-CM | POA: Diagnosis not present

## 2017-07-05 DIAGNOSIS — N898 Other specified noninflammatory disorders of vagina: Secondary | ICD-10-CM | POA: Diagnosis not present

## 2017-07-05 DIAGNOSIS — Z4651 Encounter for fitting and adjustment of gastric lap band: Secondary | ICD-10-CM | POA: Diagnosis not present

## 2017-07-19 DIAGNOSIS — I10 Essential (primary) hypertension: Secondary | ICD-10-CM | POA: Diagnosis not present

## 2017-07-19 DIAGNOSIS — E7800 Pure hypercholesterolemia, unspecified: Secondary | ICD-10-CM | POA: Diagnosis not present

## 2017-07-19 DIAGNOSIS — E1165 Type 2 diabetes mellitus with hyperglycemia: Secondary | ICD-10-CM | POA: Diagnosis not present

## 2017-08-23 DIAGNOSIS — Z4651 Encounter for fitting and adjustment of gastric lap band: Secondary | ICD-10-CM | POA: Diagnosis not present

## 2017-09-13 DIAGNOSIS — Z1231 Encounter for screening mammogram for malignant neoplasm of breast: Secondary | ICD-10-CM | POA: Diagnosis not present

## 2017-09-13 DIAGNOSIS — Z01419 Encounter for gynecological examination (general) (routine) without abnormal findings: Secondary | ICD-10-CM | POA: Diagnosis not present

## 2017-09-13 DIAGNOSIS — Z124 Encounter for screening for malignant neoplasm of cervix: Secondary | ICD-10-CM | POA: Diagnosis not present

## 2017-09-14 DIAGNOSIS — J069 Acute upper respiratory infection, unspecified: Secondary | ICD-10-CM | POA: Diagnosis not present

## 2017-09-14 DIAGNOSIS — Z124 Encounter for screening for malignant neoplasm of cervix: Secondary | ICD-10-CM | POA: Diagnosis not present

## 2017-09-20 DIAGNOSIS — J209 Acute bronchitis, unspecified: Secondary | ICD-10-CM | POA: Diagnosis not present

## 2017-09-20 DIAGNOSIS — J0100 Acute maxillary sinusitis, unspecified: Secondary | ICD-10-CM | POA: Diagnosis not present

## 2017-09-20 DIAGNOSIS — E7800 Pure hypercholesterolemia, unspecified: Secondary | ICD-10-CM | POA: Diagnosis not present

## 2017-10-26 IMAGING — RF DG CHOLANGIOGRAM OPERATIVE
1 series · 4 of 4 positions shown · non-contrast
Comparison: Abdominal ultrasound - 09/05/2016

CLINICAL DATA: Intraoperative cholangiogram during laparoscopic
cholecystectomy.

EXAM:
INTRAOPERATIVE CHOLANGIOGRAM
FLUOROSCOPY TIME:  18 seconds

[Series 1: run · 4 of 100 frames shown]
[frame 16/100]
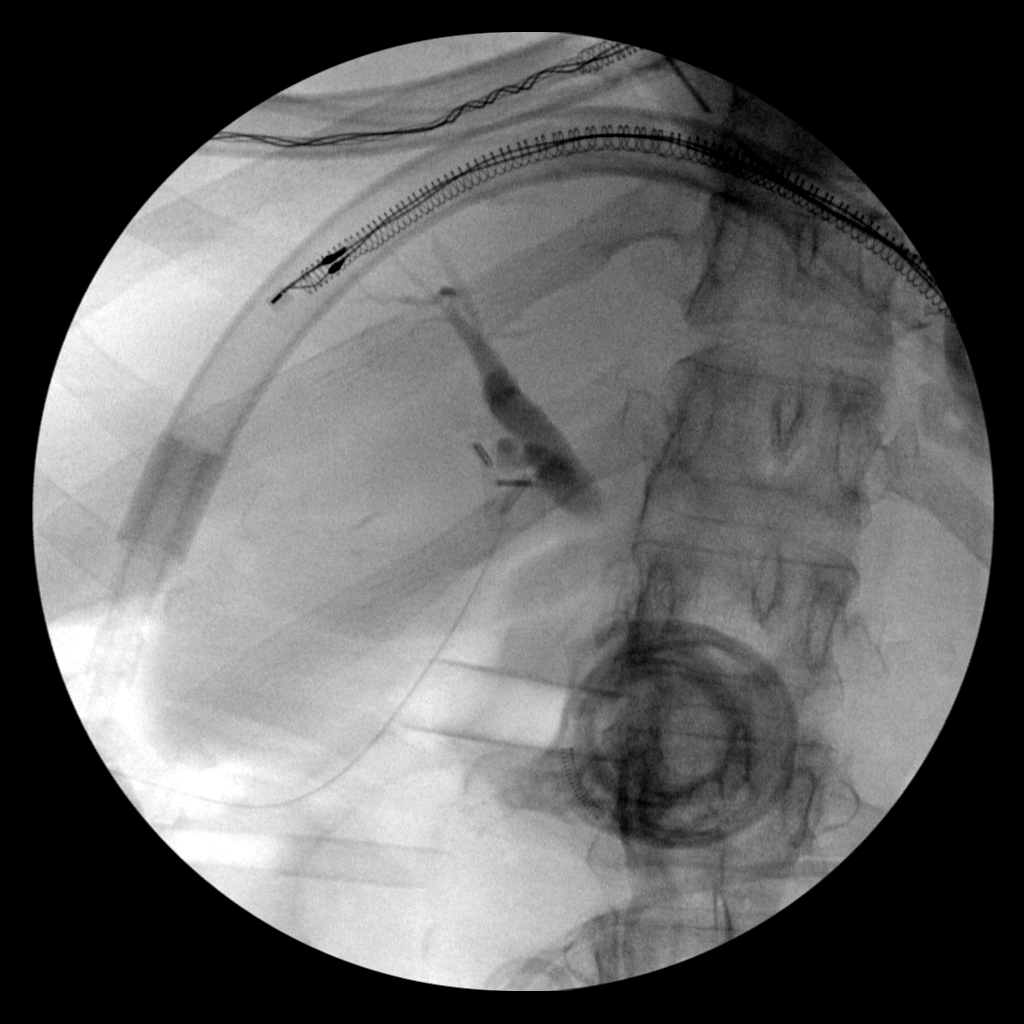
[frame 51/100]
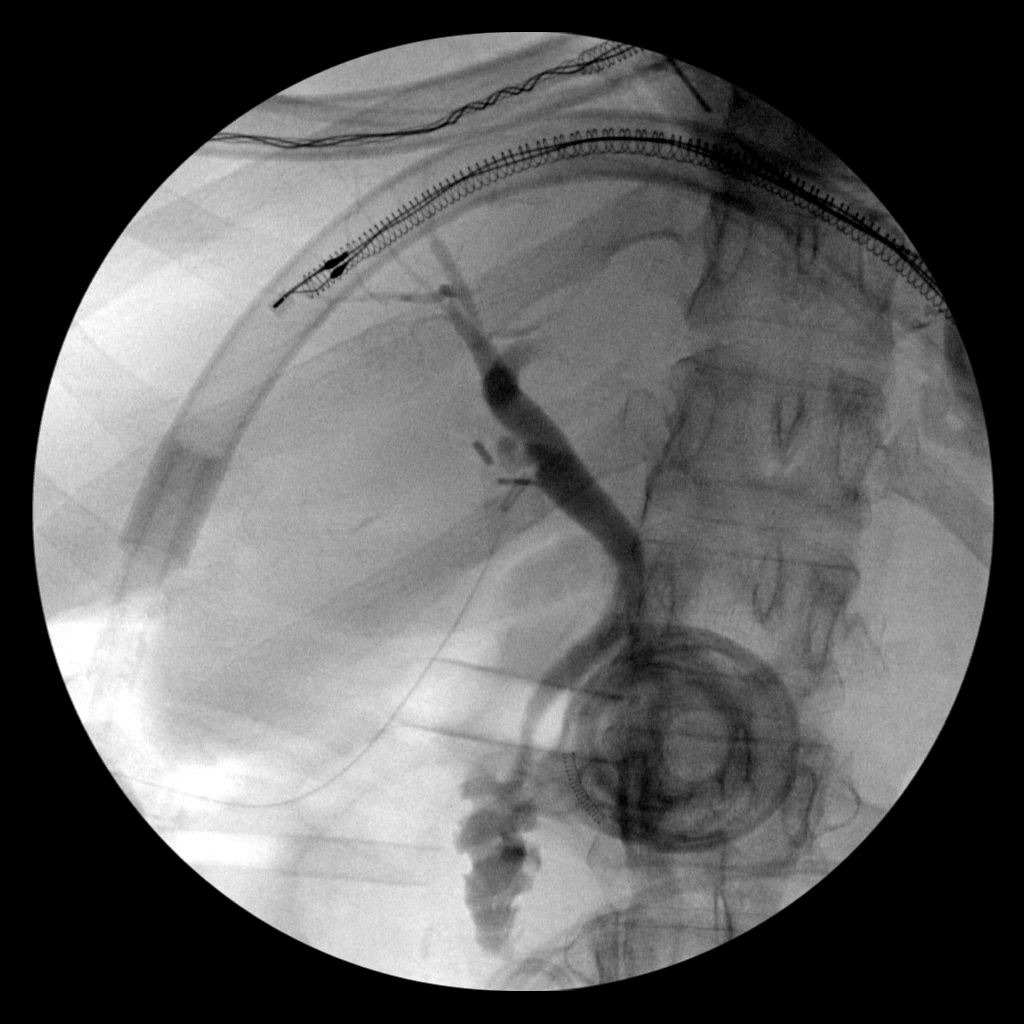
[frame 74/100]
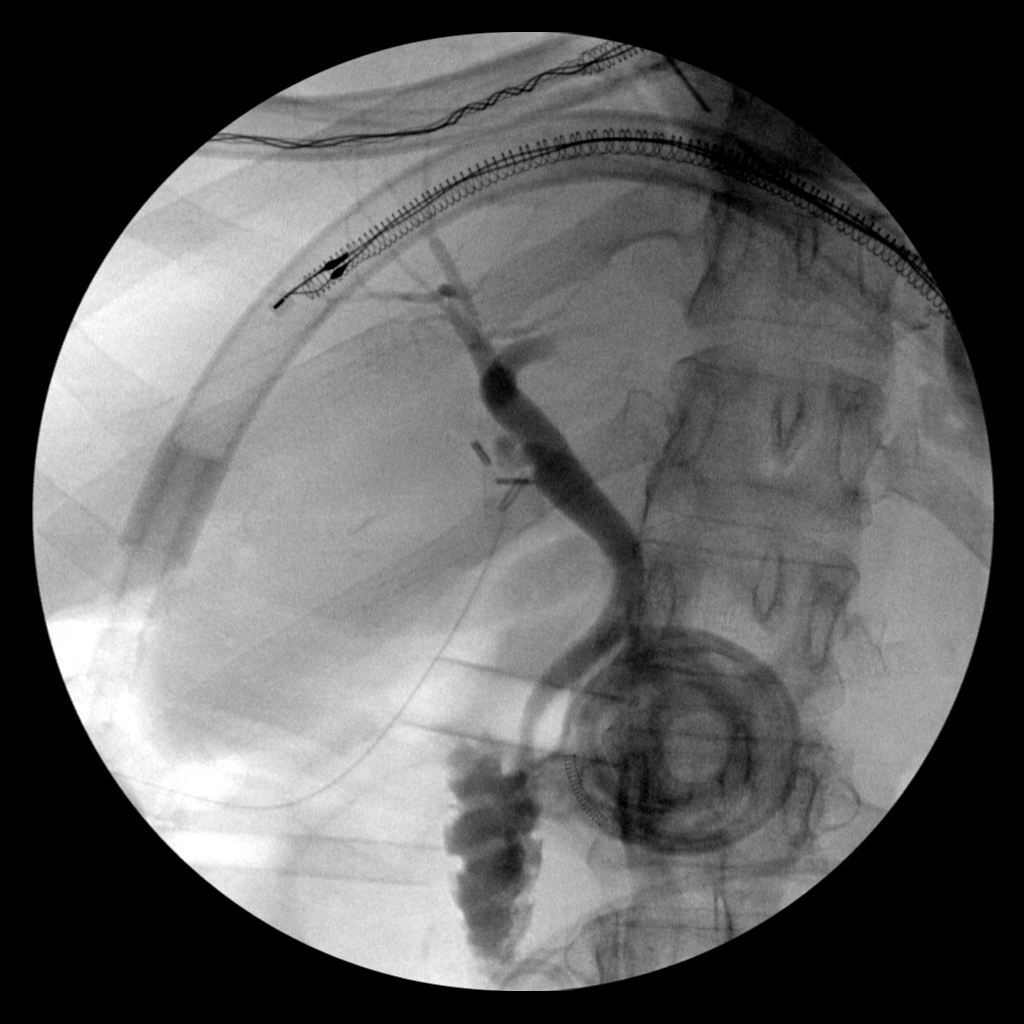
[frame 86/100]
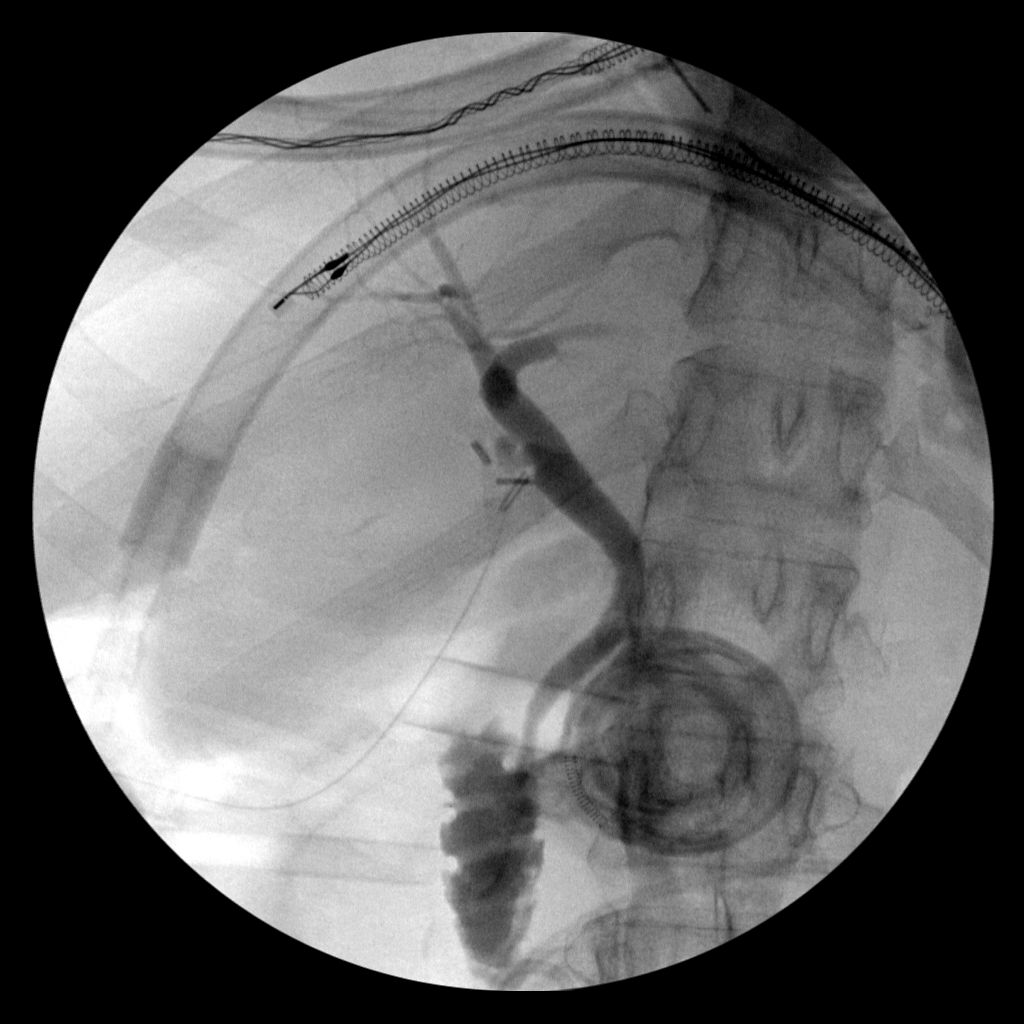

[4 of 4 positions shown; findings below may reference images not displayed]

FINDINGS: Intraoperative cholangiographic images of the right upper abdominal
quadrant during laparoscopic cholecystectomy are provided for
review.

Surgical clips overlie the expected location of the gallbladder
fossa.

Contrast injection demonstrates selective cannulation of the central
aspect of the cystic duct.

There is passage of contrast through the central aspect of the
cystic duct with filling of a non dilated common bile duct. There is
passage of contrast though the CBD and into the descending portion
of the duodenum.

There is minimal reflux of injected contrast into the common hepatic
duct and central aspect of the non dilated intrahepatic biliary
system.

There are no discrete filling defects within the opacified portions
of the biliary system to suggest the presence of
choledocholithiasis.
IMPRESSION: No evidence of choledocholithiasis.

## 2017-10-31 DIAGNOSIS — I10 Essential (primary) hypertension: Secondary | ICD-10-CM | POA: Diagnosis not present

## 2017-10-31 DIAGNOSIS — E1165 Type 2 diabetes mellitus with hyperglycemia: Secondary | ICD-10-CM | POA: Diagnosis not present

## 2017-10-31 DIAGNOSIS — R300 Dysuria: Secondary | ICD-10-CM | POA: Diagnosis not present

## 2017-10-31 DIAGNOSIS — R5383 Other fatigue: Secondary | ICD-10-CM | POA: Diagnosis not present

## 2017-10-31 DIAGNOSIS — E7800 Pure hypercholesterolemia, unspecified: Secondary | ICD-10-CM | POA: Diagnosis not present

## 2017-11-22 DIAGNOSIS — I10 Essential (primary) hypertension: Secondary | ICD-10-CM | POA: Diagnosis not present

## 2017-11-22 DIAGNOSIS — E7800 Pure hypercholesterolemia, unspecified: Secondary | ICD-10-CM | POA: Diagnosis not present

## 2017-11-22 DIAGNOSIS — E1165 Type 2 diabetes mellitus with hyperglycemia: Secondary | ICD-10-CM | POA: Diagnosis not present

## 2017-11-23 DIAGNOSIS — R197 Diarrhea, unspecified: Secondary | ICD-10-CM | POA: Diagnosis not present

## 2018-02-05 ENCOUNTER — Encounter (HOSPITAL_COMMUNITY): Payer: Self-pay

## 2018-02-05 ENCOUNTER — Other Ambulatory Visit: Payer: Self-pay

## 2018-02-05 DIAGNOSIS — Z9884 Bariatric surgery status: Secondary | ICD-10-CM | POA: Insufficient documentation

## 2018-02-05 DIAGNOSIS — K2900 Acute gastritis without bleeding: Secondary | ICD-10-CM | POA: Insufficient documentation

## 2018-02-05 DIAGNOSIS — Z79899 Other long term (current) drug therapy: Secondary | ICD-10-CM | POA: Diagnosis not present

## 2018-02-05 DIAGNOSIS — Z9049 Acquired absence of other specified parts of digestive tract: Secondary | ICD-10-CM | POA: Insufficient documentation

## 2018-02-05 DIAGNOSIS — E119 Type 2 diabetes mellitus without complications: Secondary | ICD-10-CM | POA: Diagnosis not present

## 2018-02-05 DIAGNOSIS — K209 Esophagitis, unspecified: Secondary | ICD-10-CM | POA: Diagnosis not present

## 2018-02-05 DIAGNOSIS — R109 Unspecified abdominal pain: Secondary | ICD-10-CM | POA: Diagnosis present

## 2018-02-05 DIAGNOSIS — I10 Essential (primary) hypertension: Secondary | ICD-10-CM | POA: Diagnosis not present

## 2018-02-05 DIAGNOSIS — N200 Calculus of kidney: Secondary | ICD-10-CM | POA: Diagnosis not present

## 2018-02-05 NOTE — ED Triage Notes (Signed)
Pt reports abdominal pain x 4 days. She has a hx of lap band procedure and states that the pain is around her port in addition to generalized abdominal pain. She also endorses indigestion. She saw here PCP today, who draw blood and called her tonight to tell her that her WBC's were elevated and she should get a CT scan.

## 2018-02-05 NOTE — ED Notes (Signed)
Pt requesting that blood work from earlier today be used and declines redraw at this time.

## 2018-02-06 ENCOUNTER — Emergency Department (HOSPITAL_COMMUNITY): Payer: 59

## 2018-02-06 ENCOUNTER — Emergency Department (HOSPITAL_COMMUNITY)
Admission: EM | Admit: 2018-02-06 | Discharge: 2018-02-06 | Disposition: A | Discharge status: Home / Self Care | Payer: 59 | Attending: Emergency Medicine | Admitting: Emergency Medicine

## 2018-02-06 ENCOUNTER — Encounter (HOSPITAL_COMMUNITY): Payer: Self-pay

## 2018-02-06 DIAGNOSIS — K2900 Acute gastritis without bleeding: Secondary | ICD-10-CM

## 2018-02-06 DIAGNOSIS — N200 Calculus of kidney: Secondary | ICD-10-CM | POA: Diagnosis not present

## 2018-02-06 LAB — CBC WITH DIFFERENTIAL/PLATELET
Abs Immature Granulocytes: 0.05 10*3/uL (ref 0.00–0.07)
BASOS ABS: 0.1 10*3/uL (ref 0.0–0.1)
Basophils Relative: 0 %
Eosinophils Absolute: 0.1 10*3/uL (ref 0.0–0.5)
Eosinophils Relative: 0 %
HEMATOCRIT: 38.8 % (ref 36.0–46.0)
HEMOGLOBIN: 12.2 g/dL (ref 12.0–15.0)
Immature Granulocytes: 0 %
LYMPHS ABS: 3.1 10*3/uL (ref 0.7–4.0)
LYMPHS PCT: 23 %
MCH: 27.2 pg (ref 26.0–34.0)
MCHC: 31.4 g/dL (ref 30.0–36.0)
MCV: 86.6 fL (ref 80.0–100.0)
Monocytes Absolute: 0.8 10*3/uL (ref 0.1–1.0)
Monocytes Relative: 6 %
NRBC: 0 % (ref 0.0–0.2)
Neutro Abs: 9.1 10*3/uL — ABNORMAL HIGH (ref 1.7–7.7)
Neutrophils Relative %: 71 %
Platelets: 259 10*3/uL (ref 150–400)
RBC: 4.48 MIL/uL (ref 3.87–5.11)
RDW: 14.6 % (ref 11.5–15.5)
WBC: 13.1 10*3/uL — ABNORMAL HIGH (ref 4.0–10.5)

## 2018-02-06 LAB — BASIC METABOLIC PANEL
ANION GAP: 9 (ref 5–15)
BUN: 9 mg/dL (ref 6–20)
CHLORIDE: 111 mmol/L (ref 98–111)
CO2: 22 mmol/L (ref 22–32)
CREATININE: 0.94 mg/dL (ref 0.44–1.00)
Calcium: 9.3 mg/dL (ref 8.9–10.3)
GFR calc non Af Amer: >60 mL/min (ref >60)
Glucose, Bld: 166 mg/dL — ABNORMAL HIGH (ref 70–99)
Potassium: 3 mmol/L — ABNORMAL LOW (ref 3.5–5.1)
Sodium: 142 mmol/L (ref 135–145)

## 2018-02-06 MED ORDER — MORPHINE SULFATE (PF) 4 MG/ML IV SOLN
4.0000 mg | Freq: Once | INTRAVENOUS | Status: AC
  Administered 2018-02-06: 4 mg via INTRAVENOUS
  Filled 2018-02-06: qty 1

## 2018-02-06 MED ORDER — HYDROCODONE-ACETAMINOPHEN 5-325 MG PO TABS: 1.0000 | ORAL_TABLET | ORAL | 0 refills | Status: DC | PRN

## 2018-02-06 MED ORDER — SODIUM CHLORIDE (PF) 0.9 % IJ SOLN
INTRAMUSCULAR | Status: AC
  Filled 2018-02-06: qty 50

## 2018-02-06 MED ORDER — ONDANSETRON HCL 4 MG/2ML IJ SOLN
4.0000 mg | Freq: Once | INTRAMUSCULAR | Status: AC
  Administered 2018-02-06: 4 mg via INTRAVENOUS
  Filled 2018-02-06: qty 2

## 2018-02-06 MED ORDER — SODIUM CHLORIDE 0.9 % IV BOLUS
500.0000 mL | Freq: Once | INTRAVENOUS | Status: AC
  Administered 2018-02-06: 500 mL via INTRAVENOUS

## 2018-02-06 MED ORDER — IOPAMIDOL (ISOVUE-300) INJECTION 61%
100.0000 mL | Freq: Once | INTRAVENOUS | Status: AC | PRN
  Administered 2018-02-06: 100 mL via INTRAVENOUS

## 2018-02-06 MED ORDER — IOPAMIDOL (ISOVUE-300) INJECTION 61%
INTRAVENOUS | Status: AC
  Filled 2018-02-06: qty 100

## 2018-02-06 MED ORDER — SUCRALFATE 1 GM/10ML PO SUSP: 1.0000 g | Freq: Three times a day (TID) | ORAL | 0 refills | Status: DC

## 2018-02-06 NOTE — ED Notes (Signed)
Patient transported to CT 

## 2018-02-06 NOTE — ED Provider Notes (Signed)
[facility] DEPT Provider Note   CSN: [id number] Arrival date & time: 02/05/18  2201     History   Chief Complaint Chief Complaint  Patient presents with  . Abdominal Pain    HPI Stacey Riddle is a 52 y.o. female.  Patient presents to the emergency department for evaluation of abdominal pain.  Patient reports that pain is been ongoing for 3 or 4 days.  Pain is across the middle of her abdomen, radiates upwards, feels like heartburn.  Patient was seen by her primary doctor today, had blood work.  She was called today and was told that her white blood cell count was elevated, told to come to the ER.     Past Medical History:  Diagnosis Date  . GERD (gastroesophageal reflux disease)   . H/O laparoscopic adjustable gastric banding 04/09/2007  . History of cervical dysplasia   . History of hiatal hernia    S/P  REPAIR 04-09-2007  . History of kidney stones   . History of polycystic ovarian syndrome   . Hypertension   . Left ureteral stone   . Nephrolithiasis    LEFT  . Type 2 diabetes mellitus ([place])   . Wears contact lenses     Patient Active Problem List   Diagnosis Date Noted  . Lapband APS + [facility] repair Jan 2009 03/08/2012  . Chest pain 07/26/2011  . Fitting and adjustment of gastric lap band 06/29/2011  . URI 05/26/2009  . DM 05/14/2009  . ALLERGIC RHINITIS 05/14/2009  . COUGH, CHRONIC 05/14/2009  . POLYCYSTIC OVARIAN DISEASE 05/13/2009  . HYPERTENSION 05/13/2009    Past Surgical History:  Procedure Laterality Date  . CARPAL TUNNEL RELEASE Bilateral right 2000/  left 10-02-2008  . CESAREAN SECTION  03/16/2005  . CHOLECYSTECTOMY N/A 11/23/2016   Procedure: LAPAROSCOPIC CHOLECYSTECTOMY WITH INTRAOPERATIVE CHOLANGIOGRAM;  Surgeon: Johnathan Hausen, MD;  Location: [facility];  Service: General;  Laterality: N/A;  . CYSTOSCOPY WITH RETROGRADE PYELOGRAM, URETEROSCOPY AND STENT PLACEMENT Left 01/29/2017   Procedure: CYSTOSCOPY WITH  RETROGRADE PYELOGRAM, URETEROSCOPY, LASER LITHOTRIPSY, STONE BASKETRY,  [place];  Surgeon: Nickie Retort, MD;  Location: [facility];  Service: Urology;  Laterality: Left;  . HOLMIUM LASER APPLICATION Left 00/10/6759   Procedure: HOLMIUM LASER APPLICATION;  Surgeon: Nickie Retort, MD;  Location: [facility];  Service: Urology;  Laterality: Left;  . LAPAROSCOPIC GASTRIC BANDING WITH HIATAL HERNIA REPAIR  04-09-2007   dr Hassell Done [facility]  . REPLACEMENT LAP BAND PORT  02-14-2010   dr Hassell Done [facility]     OB History   None      Home Medications    Prior to Admission medications   Medication Sig Start Date End Date Taking? Authorizing Provider  acetaminophen (TYLENOL) 500 MG tablet Take 1,000 mg by mouth every 6 (six) hours as needed for mild pain.    [provider]  BYDUREON 2 MG PEN Inject 2 mg as directed once a week. On Fridays 05/25/16   [provider]  cephALEXin (KEFLEX) 500 MG capsule Take 1 capsule (500 mg total) 4 (four) times daily by mouth. 01/29/17   Nickie Retort, MD  HYDROcodone-acetaminophen (NORCO/VICODIN) 5-325 MG tablet Take 1-2 tablets by mouth every 4 (four) hours as needed for moderate pain. 02/06/18   Orpah Greek, MD  ibuprofen (ADVIL,MOTRIN) 200 MG tablet Take 400 mg by mouth every 6 (six) hours as needed for mild pain or moderate pain.     [provider]  losartan (COZAAR) 25 MG tablet Take 25 mg by mouth at bedtime.     [provider]  Multiple Vitamin (MULTIVITAMIN WITH MINERALS) TABS Take 1 tablet by mouth at bedtime.     [provider]  omeprazole (PRILOSEC) 20 MG capsule Take 20 mg by mouth as needed.    [provider]  potassium chloride SA (K-DUR,KLOR-CON) 20 MEQ tablet Take 40 mEq by mouth at bedtime.     [provider]  sucralfate (CARAFATE) 1 GM/10ML suspension Take 10 mLs (1 g total) by mouth 4 (four) times daily -  with meals and at  bedtime. 02/06/18   Orpah Greek, MD    Family History Family History  Problem Relation Age of Onset  . Cancer Mother   . Hyperlipidemia Mother   . Osteoporosis Mother   . Heart disease Father   . Diabetes Father   . Hypertension Father     Social History Social History   Tobacco Use  . Smoking status: Never Smoker  . Smokeless tobacco: Never Used  Substance Use Topics  . Alcohol use: No  . Drug use: No     Allergies   Biaxin [clarithromycin]   Review of Systems Review of Systems  Gastrointestinal: Positive for abdominal pain.  All other systems reviewed and are negative.    Physical Exam Updated Vital Signs BP 124/76   Pulse 63   Temp 98.5 F (36.9 C) (Oral)   Resp 12   SpO2 100%   Physical Exam  Constitutional: She is oriented to person, place, and time. She appears well-developed and well-nourished. No distress.  HENT:  Head: Normocephalic and atraumatic.  Right Ear: Hearing normal.  Left Ear: Hearing normal.  Nose: Nose normal.  Mouth/Throat: Oropharynx is clear and moist and mucous membranes are normal.  Eyes: Pupils are equal, round, and reactive to light. Conjunctivae and EOM are normal.  Neck: Normal range of motion. Neck supple.  Cardiovascular: Regular rhythm, S1 normal and S2 normal. Exam reveals no gallop and no friction rub.  No murmur heard. Pulmonary/Chest: Effort normal and breath sounds normal. No respiratory distress. She exhibits no tenderness.  Abdominal: Soft. Normal appearance and bowel sounds are normal. There is no hepatosplenomegaly. There is generalized tenderness. There is no rebound, no guarding, no tenderness at McBurney's point and negative Murphy's sign. No hernia.  Musculoskeletal: Normal range of motion.  Neurological: She is alert and oriented to person, place, and time. She has normal strength. No cranial nerve deficit or sensory deficit. Coordination normal. GCS eye subscore is 4. GCS verbal subscore is 5. GCS  motor subscore is 6.  Skin: Skin is warm, dry and intact. No rash noted. No cyanosis.  Psychiatric: She has a normal mood and affect. Her speech is normal and behavior is normal. Thought content normal.  Nursing note and vitals reviewed.    ED Treatments / Results  Labs (all labs ordered are listed, but only abnormal results are displayed) Labs Reviewed  CBC WITH DIFFERENTIAL/PLATELET - Abnormal; Notable for the following components:      Result Value   WBC 13.1 (*)    Neutro Abs 9.1 (*)    All other components within normal limits  BASIC METABOLIC PANEL - Abnormal; Notable for the following components:   Potassium 3.0 (*)    Glucose, Bld 166 (*)    All other components within normal limits    EKG None  Radiology Ct Abdomen Pelvis W Contrast  Result Date: 02/06/2018  CLINICAL DATA:  General abdominal pain for 4 days, worse around lap band port. History of kidney stones, hiatal hernia, cholecystectomy. EXAM: CT ABDOMEN AND PELVIS WITH CONTRAST TECHNIQUE: Multidetector CT imaging of the abdomen and pelvis was performed using the standard protocol following bolus administration of intravenous contrast. CONTRAST:  148mL ISOVUE-300 IOPAMIDOL (ISOVUE-300) INJECTION 61% COMPARISON:  CT abdomen and pelvis January 05, 2017 FINDINGS: LOWER CHEST: Lung bases are clear. Included heart size is normal. No pericardial effusion. HEPATOBILIARY: Status post cholecystectomy. PANCREAS: Normal. SPLEEN: Normal. ADRENALS/URINARY TRACT: Kidneys are orthotopic, demonstrating symmetric enhancement. 3 mm LEFT interpolar nephrolithiasis. No hydronephrosis or solid renal masses. 1 cm cyst upper pole RIGHT kidney. The unopacified ureters are normal in course and caliber. Delayed imaging through the kidneys demonstrates symmetric prompt contrast excretion within the proximal urinary collecting system. Urinary bladder is partially distended and unremarkable. Normal adrenal glands. STOMACH/BOWEL: Similar moderate  fluid-filled hiatal hernia. Focal circumferential mural edema immediately above gastric lap band. The small and large bowel are normal in course and caliber without inflammatory changes. The appendix is not discretely identified, however there are no inflammatory changes in the right lower quadrant. VASCULAR/LYMPHATIC: Aortoiliac vessels are normal in course and caliber. No lymphadenopathy by CT size criteria. REPRODUCTIVE: IUD in central uterus. 4 x 8.6 cm LEFT adnexal cyst, 44 Hounsfield units. OTHER: No intraperitoneal free fluid or free air. Small fat containing umbilical hernia. MUSCULOSKELETAL: Nonacute. Anterior abdominal wall scarring. Similar focal sclerosis LEFT iliac. Moderate LEFT sacroiliac osteoarthrosis. Severe lower lumbar facet arthropathy. IMPRESSION: 1. Focal mural edema immediately above gastric lap band concerning for gastritis. Moderate hiatal hernia. 2. 3 mm nonobstructing LEFT nephrolithiasis. 3. **An incidental finding of potential clinical significance has been found. 4 x 8.6 cm LEFT adnexal cyst. Recommend pelvic ultrasound on nonemergent basis. This recommendation follows ACR consensus guidelines: White Paper of the ACR Incidental Findings Committee II on Adnexal Findings. J Am Coll Radiol [phone]. ** Electronically Signed   By: Elon Alas M.D.   On: 02/06/2018 03:33    Procedures Procedures (including critical care time)  Medications Ordered in ED Medications  iopamidol (ISOVUE-300) 61 % injection (has no administration in time range)  sodium chloride (PF) 0.9 % injection (has no administration in time range)  morphine 4 MG/ML injection 4 mg (4 mg Intravenous Given 02/06/18 0130)  ondansetron (ZOFRAN) injection 4 mg (4 mg Intravenous Given 02/06/18 0128)  sodium chloride 0.9 % bolus 500 mL (0 mLs Intravenous Stopped 02/06/18 0357)  iopamidol (ISOVUE-300) 61 % injection 100 mL (100 mLs Intravenous Contrast Given 02/06/18 0305)     Initial Impression /  Assessment and Plan / ED Course  I have reviewed the triage vital signs and the nursing notes.  Pertinent labs & imaging results that were available during my care of the patient were reviewed by me and considered in my medical decision making (see chart for details).     Patient presents to the emergency department for evaluation of abdominal pain.  She was referred to the ER by her primary doctor.  Patient has diffuse abdominal pain and she has diffuse tenderness on exam but no guarding, no rebound, no signs of peritonitis.  Lab work was essentially unremarkable.  She did have a slight leukocytosis which prompted her primary doctor to send her to the ER.  She does not have infectious symptoms.  CT scan shows evidence of gastritis, no other acute abnormality.  Her doctor prescribed her Protonix earlier today.  She was encouraged to take this but  was also added Carafate, follow-up with her surgeon, Dr. Hassell Done.  Final Clinical Impressions(s) / ED Diagnoses   Final diagnoses:  Acute gastritis without hemorrhage, unspecified gastritis type    ED Discharge Orders         Ordered    sucralfate (CARAFATE) 1 GM/10ML suspension  3 times daily with meals & bedtime     02/06/18 0359    HYDROcodone-acetaminophen (NORCO/VICODIN) 5-325 MG tablet  Every 4 hours PRN     02/06/18 0400           Orpah Greek, MD 02/06/18 0401

## 2018-03-04 DIAGNOSIS — K219 Gastro-esophageal reflux disease without esophagitis: Secondary | ICD-10-CM | POA: Diagnosis not present

## 2018-03-04 DIAGNOSIS — R1310 Dysphagia, unspecified: Secondary | ICD-10-CM | POA: Diagnosis not present

## 2018-03-05 DIAGNOSIS — K209 Esophagitis, unspecified: Secondary | ICD-10-CM | POA: Diagnosis not present

## 2018-03-05 DIAGNOSIS — E1165 Type 2 diabetes mellitus with hyperglycemia: Secondary | ICD-10-CM | POA: Diagnosis not present

## 2018-03-05 DIAGNOSIS — Z9884 Bariatric surgery status: Secondary | ICD-10-CM | POA: Diagnosis not present

## 2018-03-06 DIAGNOSIS — Z4651 Encounter for fitting and adjustment of gastric lap band: Secondary | ICD-10-CM | POA: Diagnosis not present

## 2018-03-13 DIAGNOSIS — E7800 Pure hypercholesterolemia, unspecified: Secondary | ICD-10-CM | POA: Diagnosis not present

## 2018-03-13 DIAGNOSIS — E1165 Type 2 diabetes mellitus with hyperglycemia: Secondary | ICD-10-CM | POA: Diagnosis not present

## 2018-03-13 DIAGNOSIS — I10 Essential (primary) hypertension: Secondary | ICD-10-CM | POA: Diagnosis not present

## 2018-03-25 DIAGNOSIS — N951 Menopausal and female climacteric states: Secondary | ICD-10-CM | POA: Diagnosis not present

## 2018-03-25 DIAGNOSIS — N898 Other specified noninflammatory disorders of vagina: Secondary | ICD-10-CM | POA: Diagnosis not present

## 2018-03-25 DIAGNOSIS — N760 Acute vaginitis: Secondary | ICD-10-CM | POA: Diagnosis not present

## 2018-11-11 ENCOUNTER — Other Ambulatory Visit: Payer: Self-pay | Admitting: Physician Assistant

## 2018-11-11 DIAGNOSIS — R1032 Left lower quadrant pain: Secondary | ICD-10-CM

## 2018-11-11 DIAGNOSIS — R1012 Left upper quadrant pain: Secondary | ICD-10-CM

## 2018-11-11 DIAGNOSIS — R197 Diarrhea, unspecified: Secondary | ICD-10-CM

## 2018-11-14 ENCOUNTER — Other Ambulatory Visit: Payer: 59

## 2018-11-20 ENCOUNTER — Ambulatory Visit
Admission: RE | Admit: 2018-11-20 | Discharge: 2018-11-20 | Disposition: A | Discharge status: Home / Self Care | Payer: 59 | Source: Ambulatory Visit | Attending: Physician Assistant | Admitting: Physician Assistant

## 2018-11-20 DIAGNOSIS — R1032 Left lower quadrant pain: Secondary | ICD-10-CM

## 2018-11-20 DIAGNOSIS — R197 Diarrhea, unspecified: Secondary | ICD-10-CM

## 2018-11-20 DIAGNOSIS — R1012 Left upper quadrant pain: Secondary | ICD-10-CM

## 2018-11-20 MED ORDER — IOPAMIDOL (ISOVUE-300) INJECTION 61%
100.0000 mL | Freq: Once | INTRAVENOUS | Status: AC | PRN
  Administered 2018-11-20: 100 mL via INTRAVENOUS

## 2018-12-03 ENCOUNTER — Other Ambulatory Visit: Payer: Self-pay | Admitting: Gastroenterology

## 2018-12-03 DIAGNOSIS — R1012 Left upper quadrant pain: Secondary | ICD-10-CM

## 2018-12-05 ENCOUNTER — Encounter (HOSPITAL_BASED_OUTPATIENT_CLINIC_OR_DEPARTMENT_OTHER): Payer: Self-pay

## 2018-12-05 ENCOUNTER — Emergency Department (HOSPITAL_BASED_OUTPATIENT_CLINIC_OR_DEPARTMENT_OTHER): Payer: 59

## 2018-12-05 ENCOUNTER — Other Ambulatory Visit: Payer: 59

## 2018-12-05 ENCOUNTER — Emergency Department (HOSPITAL_BASED_OUTPATIENT_CLINIC_OR_DEPARTMENT_OTHER)
Admission: EM | Admit: 2018-12-05 | Discharge: 2018-12-05 | Disposition: A | Discharge status: Home / Self Care | Payer: 59 | Attending: Emergency Medicine | Admitting: Emergency Medicine

## 2018-12-05 DIAGNOSIS — R1032 Left lower quadrant pain: Secondary | ICD-10-CM | POA: Insufficient documentation

## 2018-12-05 DIAGNOSIS — M549 Dorsalgia, unspecified: Secondary | ICD-10-CM | POA: Insufficient documentation

## 2018-12-05 DIAGNOSIS — Y999 Unspecified external cause status: Secondary | ICD-10-CM | POA: Insufficient documentation

## 2018-12-05 DIAGNOSIS — R748 Abnormal levels of other serum enzymes: Secondary | ICD-10-CM | POA: Insufficient documentation

## 2018-12-05 DIAGNOSIS — I10 Essential (primary) hypertension: Secondary | ICD-10-CM | POA: Diagnosis not present

## 2018-12-05 DIAGNOSIS — Z79899 Other long term (current) drug therapy: Secondary | ICD-10-CM | POA: Diagnosis not present

## 2018-12-05 DIAGNOSIS — E119 Type 2 diabetes mellitus without complications: Secondary | ICD-10-CM | POA: Insufficient documentation

## 2018-12-05 DIAGNOSIS — S161XXA Strain of muscle, fascia and tendon at neck level, initial encounter: Secondary | ICD-10-CM

## 2018-12-05 DIAGNOSIS — Y92410 Unspecified street and highway as the place of occurrence of the external cause: Secondary | ICD-10-CM | POA: Insufficient documentation

## 2018-12-05 DIAGNOSIS — Y939 Activity, unspecified: Secondary | ICD-10-CM | POA: Diagnosis not present

## 2018-12-05 DIAGNOSIS — R52 Pain, unspecified: Secondary | ICD-10-CM

## 2018-12-05 LAB — CBC WITH DIFFERENTIAL/PLATELET
Abs Immature Granulocytes: 0.04 10*3/uL (ref 0.00–0.07)
Basophils Absolute: 0.1 10*3/uL (ref 0.0–0.1)
Basophils Relative: 1 %
Eosinophils Absolute: 0.1 10*3/uL (ref 0.0–0.5)
Eosinophils Relative: 1 %
HCT: 35.8 % — ABNORMAL LOW (ref 36.0–46.0)
Hemoglobin: 11.1 g/dL — ABNORMAL LOW (ref 12.0–15.0)
Immature Granulocytes: 0 %
Lymphocytes Relative: 25 %
Lymphs Abs: 2.5 10*3/uL (ref 0.7–4.0)
MCH: 26.9 pg (ref 26.0–34.0)
MCHC: 31 g/dL (ref 30.0–36.0)
MCV: 86.7 fL (ref 80.0–100.0)
Monocytes Absolute: 0.8 10*3/uL (ref 0.1–1.0)
Monocytes Relative: 8 %
Neutro Abs: 6.3 10*3/uL (ref 1.7–7.7)
Neutrophils Relative %: 65 %
Platelets: 129 10*3/uL — ABNORMAL LOW (ref 150–400)
RBC: 4.13 MIL/uL (ref 3.87–5.11)
RDW: 13.8 % (ref 11.5–15.5)
WBC: 9.8 10*3/uL (ref 4.0–10.5)
nRBC: 0 % (ref 0.0–0.2)

## 2018-12-05 LAB — LIPASE, BLOOD: Lipase: 166 U/L — ABNORMAL HIGH (ref 11–51)

## 2018-12-05 LAB — COMPREHENSIVE METABOLIC PANEL
ALT: 12 U/L (ref 0–44)
AST: 19 U/L (ref 15–41)
Albumin: 3.3 g/dL — ABNORMAL LOW (ref 3.5–5.0)
Alkaline Phosphatase: 57 U/L (ref 38–126)
Anion gap: 9 (ref 5–15)
BUN: 14 mg/dL (ref 6–20)
CO2: 23 mmol/L (ref 22–32)
Calcium: 8.2 mg/dL — ABNORMAL LOW (ref 8.9–10.3)
Chloride: 106 mmol/L (ref 98–111)
Creatinine, Ser: 0.77 mg/dL (ref 0.44–1.00)
GFR calc Af Amer: >60 mL/min (ref >60)
GFR calc non Af Amer: >60 mL/min (ref >60)
Glucose, Bld: 155 mg/dL — ABNORMAL HIGH (ref 70–99)
Potassium: 3.6 mmol/L (ref 3.5–5.1)
Sodium: 138 mmol/L (ref 135–145)
Total Bilirubin: 0.2 mg/dL — ABNORMAL LOW (ref 0.3–1.2)
Total Protein: 6.1 g/dL — ABNORMAL LOW (ref 6.5–8.1)

## 2018-12-05 LAB — PREGNANCY, URINE: Preg Test, Ur: NEGATIVE

## 2018-12-05 MED ORDER — CYCLOBENZAPRINE HCL 10 MG PO TABS: 10.0000 mg | ORAL_TABLET | Freq: Two times a day (BID) | ORAL | 0 refills | Status: DC | PRN

## 2018-12-05 MED ORDER — IOHEXOL 300 MG/ML  SOLN
100.0000 mL | Freq: Once | INTRAMUSCULAR | Status: AC | PRN
  Administered 2018-12-05: 17:00:00 100 mL via INTRAVENOUS

## 2018-12-05 NOTE — ED Notes (Signed)
Patient transported to CT 
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
## 2018-12-06 ENCOUNTER — Other Ambulatory Visit: Payer: 59

## 2018-12-10 ENCOUNTER — Other Ambulatory Visit: Payer: 59

## 2018-12-17 ENCOUNTER — Ambulatory Visit
Admission: RE | Admit: 2018-12-17 | Discharge: 2018-12-17 | Disposition: A | Discharge status: Home / Self Care | Payer: 59 | Source: Ambulatory Visit | Attending: Gastroenterology | Admitting: Gastroenterology

## 2018-12-17 ENCOUNTER — Other Ambulatory Visit: Payer: Self-pay | Admitting: Gastroenterology

## 2018-12-17 DIAGNOSIS — R1012 Left upper quadrant pain: Secondary | ICD-10-CM

## 2019-04-07 IMAGING — CT CT ABD-PELV W/ CM
2 of 5 series · 16 of 46 positions shown, 18 images · IV contrast (iopamidol)
Comparison: CT abdomen and pelvis January 05, 2017

CLINICAL DATA: General abdominal pain for 4 days, worse around lap
band port. History of kidney stones, hiatal hernia, cholecystectomy.

EXAM:
CT ABDOMEN AND PELVIS WITH CONTRAST
TECHNIQUE: Multidetector CT imaging of the abdomen and pelvis was performed
using the standard protocol following bolus administration of
intravenous contrast.
CONTRAST:  100mL CC98L2-SEE IOPAMIDOL (CC98L2-SEE) INJECTION 61%

[Series 2: axial st · axial · 0.68mm/px · z∈[+1238,+1562]mm · 13 of 75 slices shown, 15 images]
[im 5/75  soft-tissue]
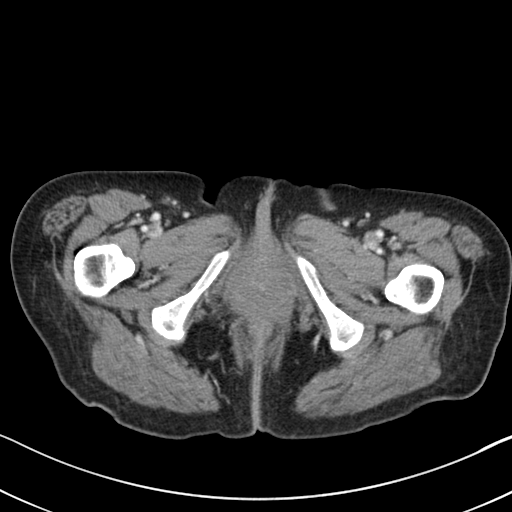
[im 5/75  bone]
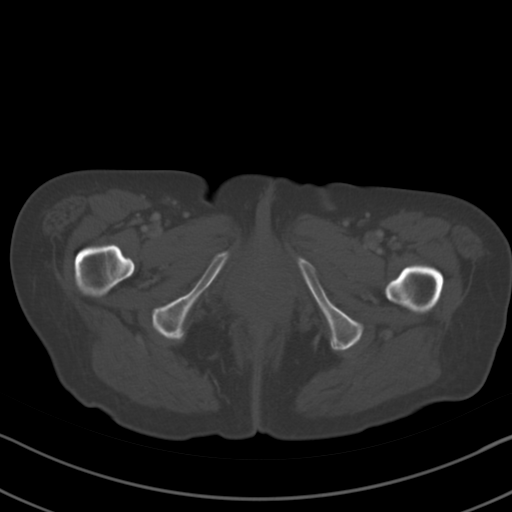
[im 9/75  soft-tissue]
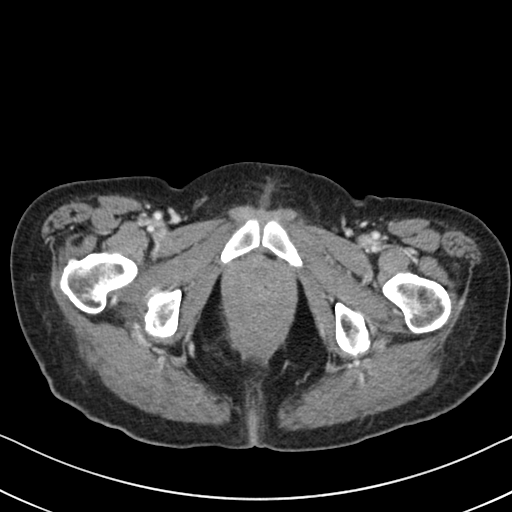
[im 18/75  soft-tissue]
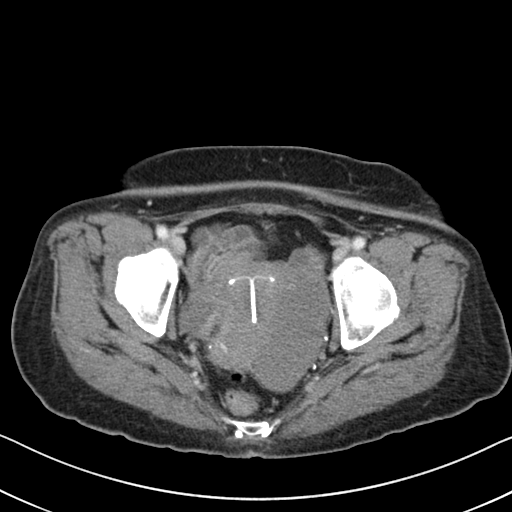
[im 22/75  soft-tissue]
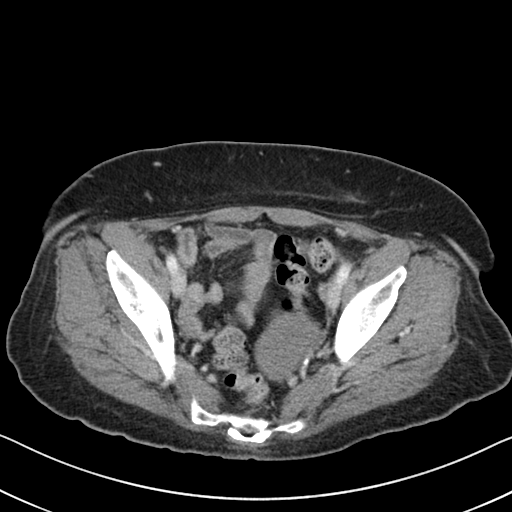
[im 27/75  soft-tissue]
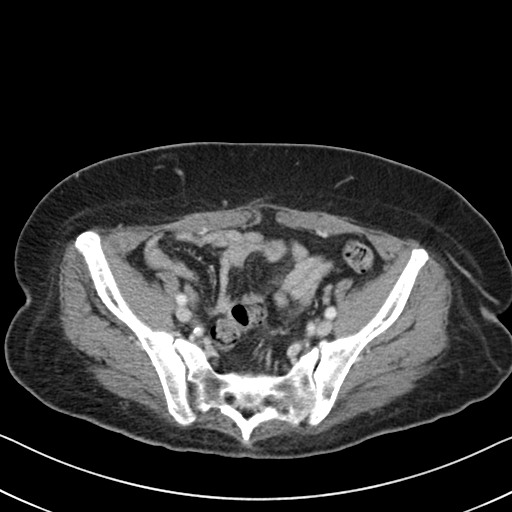
[im 31/75  soft-tissue]
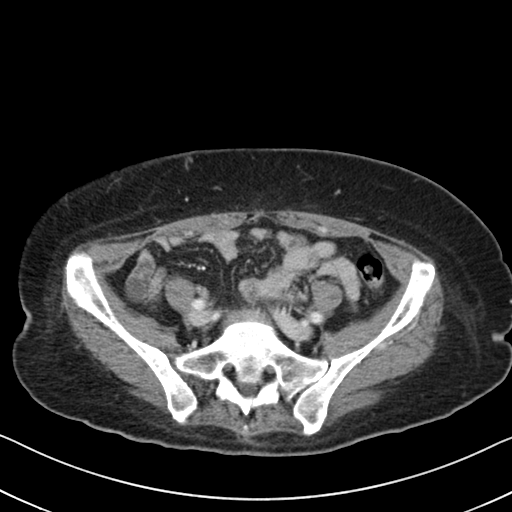
[im 40/75  soft-tissue]
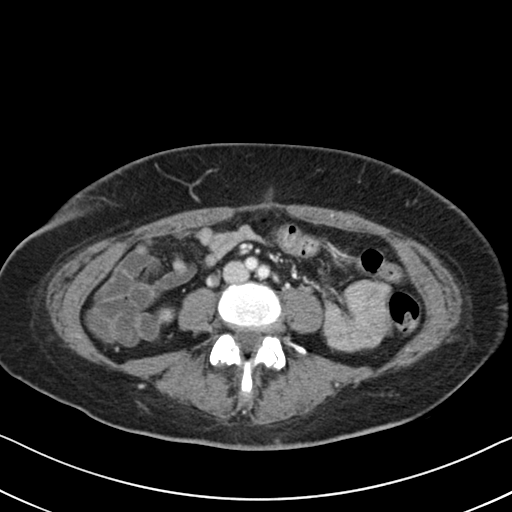
[im 44/75  soft-tissue]
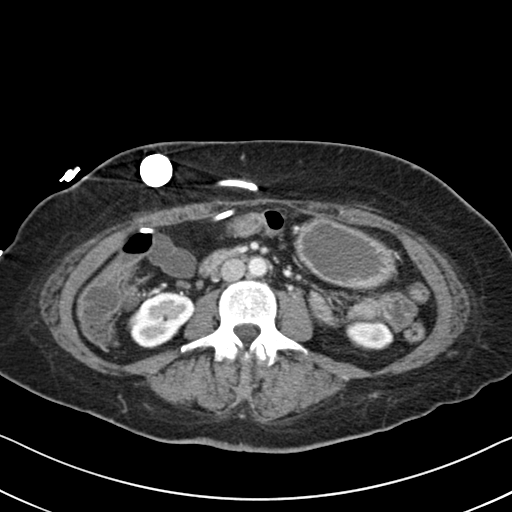
[im 48/75  soft-tissue]
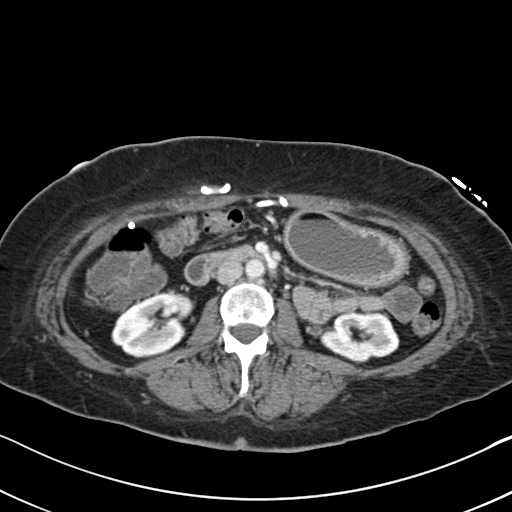
[im 48/75  bone]
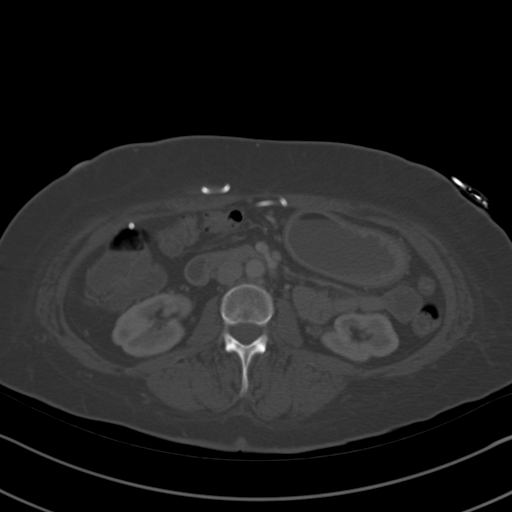
[im 53/75  soft-tissue]
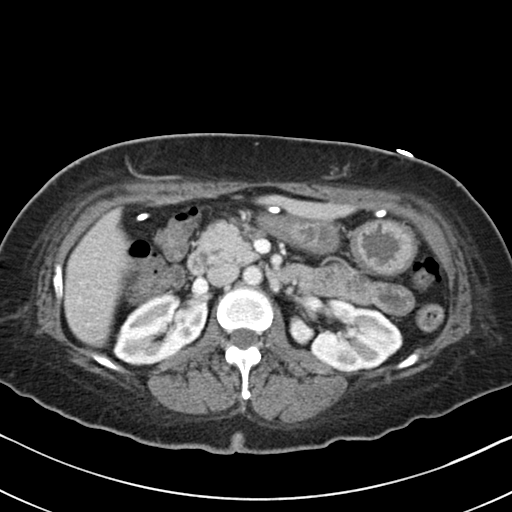
[im 57/75  soft-tissue]
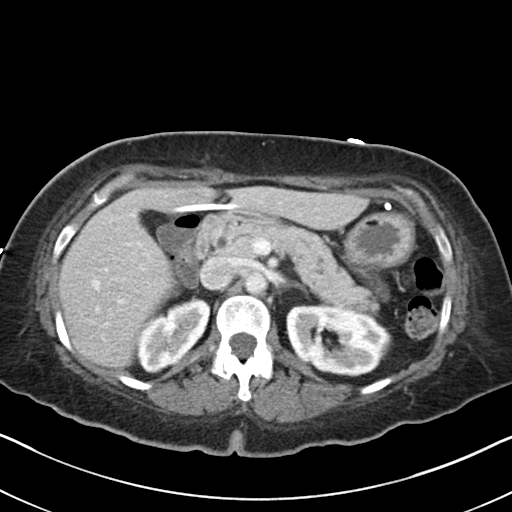
[im 66/75  soft-tissue]
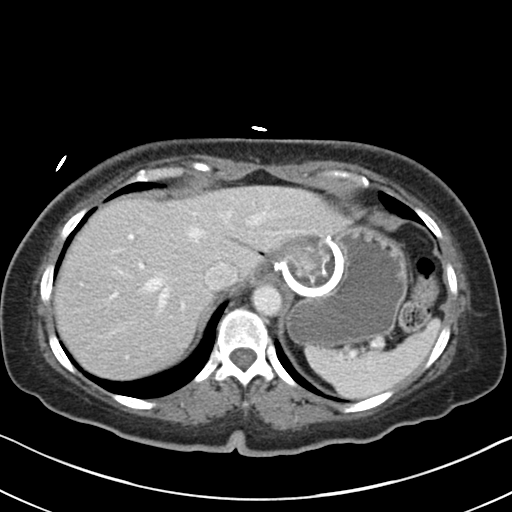
[im 70/75  soft-tissue]
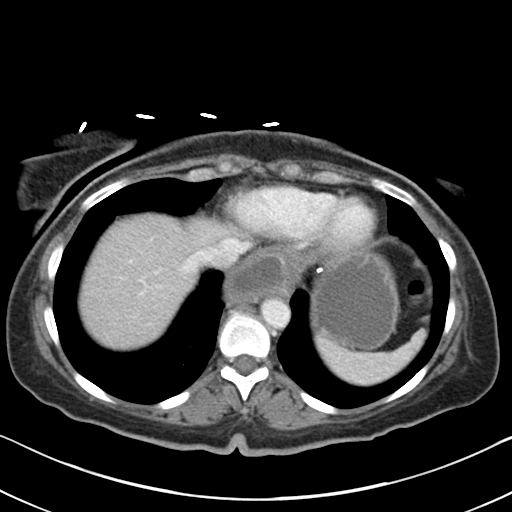

[Series 5: coronal st · coronal · 0.64mm/px · 3 of 74 slices shown]
[im 25/74  soft-tissue]
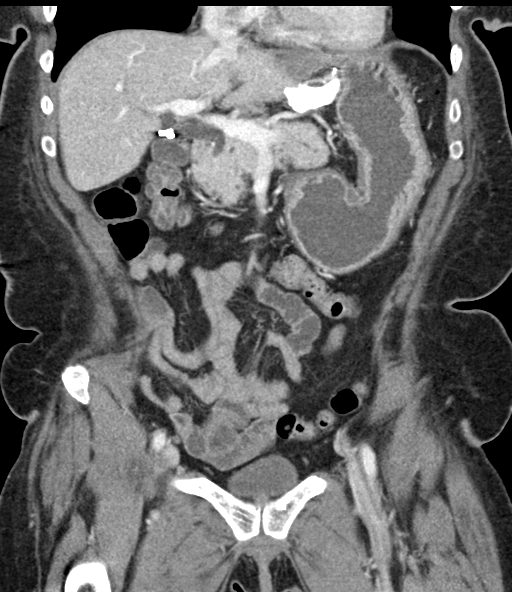
[im 33/74  soft-tissue]
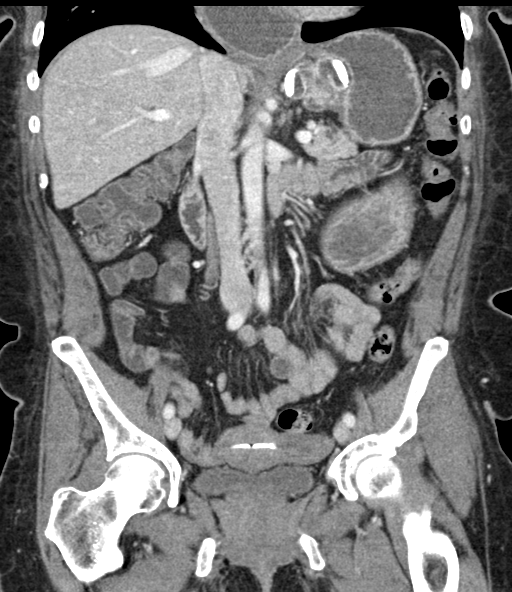
[im 41/74  soft-tissue]
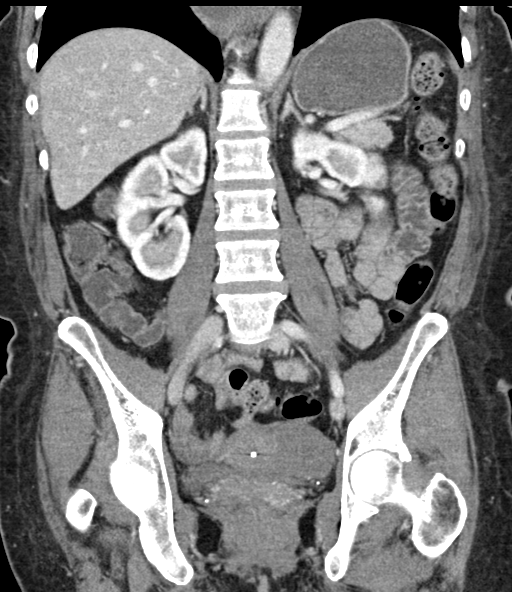

[16 of 46 positions shown; findings below may reference images not displayed]

FINDINGS: LOWER CHEST: Lung bases are clear. Included heart size is normal. No
pericardial effusion.

HEPATOBILIARY: Status post cholecystectomy.

PANCREAS: Normal.

SPLEEN: Normal.

ADRENALS/URINARY TRACT: Kidneys are orthotopic, demonstrating
symmetric enhancement. 3 mm LEFT interpolar nephrolithiasis. No
hydronephrosis or solid renal masses. 1 cm cyst upper pole RIGHT
kidney. The unopacified ureters are normal in course and caliber.
Delayed imaging through the kidneys demonstrates symmetric prompt
contrast excretion within the proximal urinary collecting system.
Urinary bladder is partially distended and unremarkable. Normal
adrenal glands.

STOMACH/BOWEL: Similar moderate fluid-filled hiatal hernia. Focal
circumferential mural edema immediately above gastric lap band. The
small and large bowel are normal in course and caliber without
inflammatory changes. The appendix is not discretely identified,
however there are no inflammatory changes in the right lower
quadrant.

VASCULAR/LYMPHATIC: Aortoiliac vessels are normal in course and
caliber. No lymphadenopathy by CT size criteria.

REPRODUCTIVE: IUD in central uterus. 4 x 8.6 cm LEFT adnexal cyst,
44 Hounsfield units.

OTHER: No intraperitoneal free fluid or free air. Small fat
containing umbilical hernia.

MUSCULOSKELETAL: Nonacute. Anterior abdominal wall scarring. Similar
focal sclerosis LEFT iliac. Moderate LEFT sacroiliac osteoarthrosis.
Severe lower lumbar facet arthropathy.
IMPRESSION: 1. Focal mural edema immediately above gastric lap band concerning
for gastritis. Moderate hiatal hernia.
2. 3 mm nonobstructing LEFT nephrolithiasis.
3. **An incidental finding of potential clinical significance has
been found. 4 x 8.6 cm LEFT adnexal cyst. Recommend pelvic
ultrasound on nonemergent basis. This recommendation follows ACR
consensus guidelines: White Paper of the ACR Incidental Findings
Committee II on Adnexal Findings. [HOSPITAL] [DATE].
**

## 2019-05-27 ENCOUNTER — Other Ambulatory Visit: Payer: Self-pay

## 2019-05-27 ENCOUNTER — Encounter: Payer: 59 | Attending: Surgery | Admitting: Dietician

## 2019-05-27 ENCOUNTER — Encounter: Payer: Self-pay | Admitting: Dietician

## 2019-05-27 DIAGNOSIS — E119 Type 2 diabetes mellitus without complications: Secondary | ICD-10-CM | POA: Diagnosis present

## 2019-05-27 DIAGNOSIS — E669 Obesity, unspecified: Secondary | ICD-10-CM | POA: Diagnosis present

## 2019-05-27 NOTE — Progress Notes (Signed)
Nutrition Assessment for Bariatric Surgery Medical Nutrition Therapy  Appt Start Time: 9:50am    End Time: 10:45am  Patient was seen on 05/27/2019 for Pre-Operative Nutrition Assessment. Letter of approval faxed to [facility] bariatric surgery program coordinator on 05/27/2019.   Referral stated Supervised Weight Loss (SWL) visits needed: 0  Planned surgery: LapBand to Sleeve Gastrectomy  Pt expectation of surgery: would like to weigh 140 lbs; stabilize weight; to have no stomach pains currently caused by the LapBand; to be able to eat/tolerate a larger  variety of foods   NUTRITION ASSESSMENT   Anthropometrics  Start weight at [facility]: 162.8 lbs (date: 05/27/2019) Height: 62 in BMI: 29.8 kg/m2     Lifestyle & Dietary Hx States her weight fluctuates typically between 140 and 160 lbs, based on the size of her band at the time. Typical meal pattern is 2-3 meals per day plus several snacks. May have sausage biscuit, cheese toast, fried chicken sandwich, fried fish sandwich for meals. States she does not eat healthy because those foods do not bother her stomach like more healthful foods (such as salad.) Likes bread and cheese. Likes vegetables cold vs hot, but does not tolerate them well. Snacks on cheese, nuts, crackers, fruit, deli [place].   24-Hr Dietary Recall First Meal: 1/2 chicken biscuit Snack: cheese  Second Meal: fried fish sandwich   Snack: nuts Third Meal: meat Snack: sweets Beverages: Coke, Mtn Dew, juice, water (3 cups/day), flavored water   NUTRITION DIAGNOSIS  Overweight/obesity ([place]-3.3) related to past poor dietary habits and physical inactivity as evidenced by patient w/ planned LapBand to Sleeve Gastrectomy conversion surgery following dietary guidelines for continued weight loss.    NUTRITION INTERVENTION  Nutrition counseling (C-1) and education (E-2) to facilitate bariatric surgery goals.  Pre-Op Goals Reviewed with the Patient . Track food and beverage  intake (pen and paper, MyFitness Pal, Baritastic app, etc.) . Make healthy food choices while monitoring portion sizes . Consume 3 meals per day or try to eat every 3-5 hours . Avoid concentrated sugars and fried foods . Keep sugar & fat in the single digits per serving on food labels . Practice CHEWING your food (aim for applesauce consistency) . Practice not drinking 15 minutes before, during, and 30 minutes after each meal and snack . Avoid all carbonated beverages (ex: soda, sparkling beverages)  . Limit caffeinated beverages (ex: coffee, tea, energy drinks) . Avoid all sugar-sweetened beverages (ex: regular soda, sports drinks)  . Avoid alcohol  . Aim for 64-100 ounces of FLUID daily (with at least half of fluid intake being plain water)  . Aim for at least 60-80 grams of PROTEIN daily . Look for a liquid protein source that contains ?15 g protein and ?5 g carbohydrate (ex: shakes, drinks, shots) . Make a list of non-food related activities . Physical activity is an important part of a healthy lifestyle so keep it moving! The goal is to reach 150 minutes of exercise per week, including cardiovascular and weight baring activity.  Handouts Provided Include  . Bariatric Surgery handouts (Nutrition Visits, Pre-Op Goals, Protein Shakes, Vitamins & Minerals)  Learning Style & Readiness for Change Teaching method utilized: Visual & Auditory  Demonstrated degree of understanding via: Teach Back  Barriers to learning/adherence to lifestyle change: None Identified    MONITORING & EVALUATION Dietary intake, weekly physical activity, body weight, and pre-op goals reached at next nutrition visit.   Next Steps Patient is to follow up at [facility] for Pre-Op Class on  Monday, March 15th for further nutrition education.

## 2019-06-09 ENCOUNTER — Encounter: Payer: 59 | Admitting: Skilled Nursing Facility1

## 2019-06-09 ENCOUNTER — Other Ambulatory Visit: Payer: Self-pay

## 2019-06-09 DIAGNOSIS — E119 Type 2 diabetes mellitus without complications: Secondary | ICD-10-CM | POA: Diagnosis not present

## 2019-06-09 DIAGNOSIS — E669 Obesity, unspecified: Secondary | ICD-10-CM

## 2019-06-09 NOTE — Progress Notes (Signed)
Pre-Operative Nutrition Class:  Appt start time: 9735   End time:  1830.  Patient was seen on 06/09/2019 for Pre-Operative [facility]ariatric Surgery [facility]   Surgery date:  Surgery type: Gastric Band to sleeve Start weight at [facility]: 162.8 Weight today: 160  Samples given per MNT protocol. Patient educated on appropriate usage: Bariatric Advantage Multivitamin Lot #[id number] Exp:08/21  Bariatric Advantage Calcium  Lot #[id number] Exp:03/29/2020  Protein Shake Lot #[id number] Exp:08/11/2020  The following the learning objectives were met by the patient during this course:  Identify Pre-Op Dietary Goals and will begin 2 weeks pre-operatively  Identify appropriate sources of fluids and proteins   State protein recommendations and appropriate sources pre and post-operatively  Identify Post-Operative Dietary Goals and will follow for 2 weeks post-operatively  Identify appropriate multivitamin and calcium sources  Describe the need for physical activity post-operatively and will follow MD recommendations  State when to call healthcare provider regarding medication questions or post-operative complications  Handouts given during class include:  Pre-Op Bariatric Surgery Diet Handout  Protein Shake Handout  Post-Op Bariatric Surgery Nutrition Handout  [facility] Program Information Flyer  Support Group Information Flyer  [facility] Bariatric Supplements Price List  Follow-Up Plan: Patient will follow-up at [facility] 2 weeks post operatively for diet advancement per MD.

## 2019-06-18 NOTE — Patient Instructions (Addendum)
DUE TO COVID-19 ONLY ONE VISITOR IS ALLOWED TO COME WITH YOU AND STAY IN THE WAITING ROOM ONLY DURING PRE OP AND PROCEDURE DAY OF SURGERY. THE 1 VISITOR MAY VISIT WITH YOU AFTER SURGERY IN YOUR PRIVATE ROOM DURING VISITING HOURS ONLY! 10a-8-pm   YOU NEED TO HAVE A COVID 19 TEST ON__4-2-21_____ @_1200  pm______, THIS TEST MUST BE DONE BEFORE SURGERY, COME  [address], [place] , [address].  ([place]) ONCE YOUR COVID TEST IS COMPLETED, PLEASE BEGIN THE QUARANTINE INSTRUCTIONS AS OUTLINED IN YOUR HANDOUT.                Wandalee Chrisp-Doyle  06/18/2019   Your procedure is scheduled on: 07-01-19   Report to [facility] Main  Entrance   Report to short stay   at        0530 AM     Call this number if you have problems the morning of surgery [phone]    Remember: Do not eat food or drink liquids :After Midnight.    BRUSH YOUR TEETH MORNING OF SURGERY AND RINSE YOUR MOUTH OUT, NO CHEWING GUM CANDY OR MINTS.     Take these medicines the morning of surgery with A SIP OF WATER: protonix  DO NOT TAKE ANY DIABETIC MEDICATIONS DAY OF YOUR SURGERY                               You may not have any metal on your body including hair pins and              piercings  Do not wear jewelry, make-up, lotions, powders or perfumes, deodorant             Do not wear nail polish on your fingernails.  Do not shave  48 hours prior to surgery.               Do not bring valuables to the hospital. [place].  Contacts, dentures or bridgework may not be worn into surgery.  Leave suitcase in the car. After surgery it may be brought to your room.     Patients discharged the day of surgery will not be allowed to drive home. IF YOU ARE HAVING SURGERY AND GOING HOME THE SAME DAY, YOU MUST HAVE AN ADULT TO DRIVE YOU HOME AND BE WITH YOU FOR 24 HOURS. YOU MAY GO HOME BY TAXI OR UBER OR ORTHERWISE, BUT AN ADULT MUST ACCOMPANY YOU  HOME AND STAY WITH YOU FOR 24 HOURS.  Name and phone number of your driver:  Special Instructions: N/A              Please read over the following fact sheets you were given: _____________________________________________________________________             [facility] - Preparing for Surgery Before surgery, you can play an important role.  Because skin is not sterile, your skin needs to be as free of germs as possible.  You can reduce the number of germs on your skin by washing with CHG (chlorahexidine gluconate) soap before surgery.  CHG is an antiseptic cleaner which kills germs and bonds with the skin to continue killing germs even after washing. Please DO NOT use if you have an allergy to CHG or antibacterial soaps.  If your skin  becomes reddened/irritated stop using the CHG and inform your nurse when you arrive at Short Stay. Do not shave (including legs and underarms) for at least 48 hours prior to the first CHG shower.  You may shave your face/neck. Please follow these instructions carefully:  1.  Shower with CHG Soap the night before surgery and the  morning of Surgery.  2.  If you choose to wash your hair, wash your hair first as usual with your  normal  shampoo.  3.  After you shampoo, rinse your hair and body thoroughly to remove the  shampoo.                           4.  Use CHG as you would any other liquid soap.  You can apply chg directly  to the skin and wash                       Gently with a scrungie or clean washcloth.  5.  Apply the CHG Soap to your body ONLY FROM THE NECK DOWN.   Do not use on face/ open                           Wound or open sores. Avoid contact with eyes, ears mouth and genitals (private parts).                       Wash face,  Genitals (private parts) with your normal soap.             6.  Wash thoroughly, paying special attention to the area where your surgery  will be performed.  7.  Thoroughly rinse your body with warm water from the neck  down.  8.  DO NOT shower/wash with your normal soap after using and rinsing off  the CHG Soap.                9.  Pat yourself dry with a clean towel.            10.  Wear clean pajamas.            11.  Place clean sheets on your bed the night of your first shower and do not  sleep with pets. Day of Surgery : Do not apply any lotions/deodorants the morning of surgery.  Please wear clean clothes to the hospital/surgery center.  FAILURE TO FOLLOW THESE INSTRUCTIONS MAY RESULT IN THE CANCELLATION OF YOUR SURGERY PATIENT SIGNATURE_________________________________  NURSE SIGNATURE__________________________________  ________________________________________________________________________

## 2019-06-20 ENCOUNTER — Encounter (HOSPITAL_COMMUNITY): Payer: Self-pay

## 2019-06-20 ENCOUNTER — Other Ambulatory Visit: Payer: Self-pay

## 2019-06-20 ENCOUNTER — Ambulatory Visit: Payer: Self-pay | Admitting: Surgery

## 2019-06-20 ENCOUNTER — Encounter (HOSPITAL_COMMUNITY)
Admission: RE | Admit: 2019-06-20 | Discharge: 2019-06-20 | Disposition: A | Discharge status: Home / Self Care | Payer: 59 | Source: Ambulatory Visit | Attending: Surgery | Admitting: Surgery

## 2019-06-20 DIAGNOSIS — Z01818 Encounter for other preprocedural examination: Secondary | ICD-10-CM | POA: Diagnosis present

## 2019-06-20 LAB — COMPREHENSIVE METABOLIC PANEL
ALT: 11 U/L (ref 0–44)
AST: 16 U/L (ref 15–41)
Albumin: 3.8 g/dL (ref 3.5–5.0)
Alkaline Phosphatase: 97 U/L (ref 38–126)
Anion gap: 9 (ref 5–15)
BUN: 10 mg/dL (ref 6–20)
CO2: 22 mmol/L (ref 22–32)
Calcium: 9.4 mg/dL (ref 8.9–10.3)
Chloride: 112 mmol/L — ABNORMAL HIGH (ref 98–111)
Creatinine, Ser: 0.82 mg/dL (ref 0.44–1.00)
GFR calc Af Amer: >60 mL/min (ref >60)
GFR calc non Af Amer: >60 mL/min (ref >60)
Glucose, Bld: 145 mg/dL — ABNORMAL HIGH (ref 70–99)
Potassium: 4.5 mmol/L (ref 3.5–5.1)
Sodium: 143 mmol/L (ref 135–145)
Total Bilirubin: 0.4 mg/dL (ref 0.3–1.2)
Total Protein: 7.5 g/dL (ref 6.5–8.1)

## 2019-06-20 LAB — CBC
HCT: 36.9 % (ref 36.0–46.0)
Hemoglobin: 11.4 g/dL — ABNORMAL LOW (ref 12.0–15.0)
MCH: 27 pg (ref 26.0–34.0)
MCHC: 30.9 g/dL (ref 30.0–36.0)
MCV: 87.4 fL (ref 80.0–100.0)
Platelets: 227 10*3/uL (ref 150–400)
RBC: 4.22 MIL/uL (ref 3.87–5.11)
RDW: 14.3 % (ref 11.5–15.5)
WBC: 13.3 10*3/uL — ABNORMAL HIGH (ref 4.0–10.5)
nRBC: 0 % (ref 0.0–0.2)

## 2019-06-20 LAB — TYPE AND SCREEN
ABO/RH(D): O POS
Antibody Screen: NEGATIVE

## 2019-06-20 LAB — GLUCOSE, CAPILLARY: Glucose-Capillary: 142 mg/dL — ABNORMAL HIGH (ref 70–99)

## 2019-06-20 LAB — HEMOGLOBIN A1C
Hgb A1c MFr Bld: 7.2 % — ABNORMAL HIGH (ref 4.8–5.6)
Mean Plasma Glucose: 159.94 mg/dL

## 2019-06-21 LAB — ABO/RH: ABO/RH(D): O POS

## 2019-06-23 NOTE — Progress Notes (Signed)
PCP - Yaakov Guthrie Cardiologist -   Chest x-ray -  EKG - 06-20-18 epic Stress Test -  ECHO -  Cardiac Cath -   Sleep Study -  CPAP -   Fasting Blood Sugar -  Checks Blood Sugar _3-4 ____ times a day  Blood Thinner Instructions: Aspirin Instructions: Last Dose:  Anesthesia review:   Patient denies shortness of breath, fever, cough and chest pain at PAT appointment   NONE   Patient verbalized understanding of instructions that were given to them at the PAT appointment. Patient was also instructed that they will need to review over the PAT instructions again at home before surgery.

## 2019-06-27 ENCOUNTER — Other Ambulatory Visit (HOSPITAL_COMMUNITY)
Admission: RE | Admit: 2019-06-27 | Discharge: 2019-06-27 | Disposition: A | Discharge status: Home / Self Care | Payer: 59 | Source: Ambulatory Visit | Attending: Surgery | Admitting: Surgery

## 2019-06-27 DIAGNOSIS — Z20822 Contact with and (suspected) exposure to covid-19: Secondary | ICD-10-CM | POA: Diagnosis not present

## 2019-06-27 DIAGNOSIS — Z01812 Encounter for preprocedural laboratory examination: Secondary | ICD-10-CM | POA: Insufficient documentation

## 2019-06-27 LAB — SARS CORONAVIRUS 2 (TAT 6-24 HRS): SARS Coronavirus 2: NEGATIVE

## 2019-06-30 ENCOUNTER — Encounter (HOSPITAL_COMMUNITY): Payer: Self-pay | Admitting: Surgery

## 2019-06-30 NOTE — Anesthesia Preprocedure Evaluation (Addendum)
Anesthesia Evaluation  Patient identified by MRN, date of birth, ID band Patient awake    Reviewed: Allergy & Precautions, NPO status , Patient's Chart, lab work & pertinent test results, reviewed documented beta blocker date and time   Airway Mallampati: II       Dental no notable dental hx. (+) Teeth Intact, Caps   Pulmonary neg pulmonary ROS,    Pulmonary exam normal breath sounds clear to auscultation       Cardiovascular hypertension, Pt. on medications Normal cardiovascular exam Rhythm:Regular Rate:Normal     Neuro/Psych negative psych ROS   GI/Hepatic Neg liver ROS, hiatal hernia, GERD  Medicated and Poorly Controlled,Laparoscopic gastric band malfunction Dilated esophagus Severe nocturnal GERD   Endo/Other  diabetes, Poorly Controlled, Type 2, Oral Hypoglycemic AgentsHyperlipidemia PCOS  Renal/GU negative Renal ROS  negative genitourinary   Musculoskeletal negative musculoskeletal ROS (+)   Abdominal   Peds  Hematology negative hematology ROS (+)   Anesthesia Other Findings   Reproductive/Obstetrics                            Anesthesia Physical Anesthesia Plan  ASA: II  Anesthesia Plan: General   Post-op Pain Management:    Induction: Intravenous, Cricoid pressure planned and Rapid sequence  PONV Risk Score and Plan: 4 or greater and Scopolamine patch - Pre-op, Ondansetron, Treatment may vary due to age or medical condition and Midazolam  Airway Management Planned: Oral ETT  Additional Equipment:   Intra-op Plan:   Post-operative Plan: Extubation in OR  Informed Consent: I have reviewed the patients History and Physical, chart, labs and discussed the procedure including the risks, benefits and alternatives for the proposed anesthesia with the patient or authorized representative who has indicated his/her understanding and acceptance.     Dental advisory  given  Plan Discussed with: CRNA and Surgeon  Anesthesia Plan Comments:        Anesthesia Quick Evaluation

## 2019-06-30 NOTE — H&P (Signed)
Chief Complaint:  lapband failure  History of Present Illness:  Stacey Riddle is an 54 y.o. female who has had a lapband since 2009.  It has gotten to the point where she cannot be filled without GER and she has regained weight.    Past Medical History:  Diagnosis Date  . GERD (gastroesophageal reflux disease)   . H/O laparoscopic adjustable gastric banding 04/09/2007  . History of cervical dysplasia   . History of hiatal hernia    S/P  REPAIR 04-09-2007  . History of kidney stones   . History of polycystic ovarian syndrome   . Hypertension   . Left ureteral stone   . Neuromuscular disorder (HCC)    neuropathy  . Type 2 diabetes mellitus ([place])   . Wears contact lenses     Past Surgical History:  Procedure Laterality Date  . CARPAL TUNNEL RELEASE Bilateral right 2000/  left 10-02-2008  . CESAREAN SECTION  03/16/2005  . CHOLECYSTECTOMY N/A 11/23/2016   Procedure: LAPAROSCOPIC CHOLECYSTECTOMY WITH INTRAOPERATIVE CHOLANGIOGRAM;  Surgeon: Johnathan Hausen, MD;  Location: [facility];  Service: General;  Laterality: N/A;  . CYSTOSCOPY WITH RETROGRADE PYELOGRAM, URETEROSCOPY AND STENT PLACEMENT Left 01/29/2017   Procedure: CYSTOSCOPY WITH RETROGRADE PYELOGRAM, URETEROSCOPY, LASER LITHOTRIPSY, STONE BASKETRY,  [place];  Surgeon: Nickie Retort, MD;  Location: [facility];  Service: Urology;  Laterality: Left;  . HOLMIUM LASER APPLICATION Left 0000000   Procedure: HOLMIUM LASER APPLICATION;  Surgeon: Nickie Retort, MD;  Location: [facility];  Service: Urology;  Laterality: Left;  . LAPAROSCOPIC GASTRIC BANDING WITH HIATAL HERNIA REPAIR  04-09-2007   dr Hassell Done [facility]  . REPLACEMENT LAP BAND PORT  02-14-2010   dr Hassell Done [facility]    No current facility-administered medications for this encounter.   Current Outpatient Medications  Medication Sig Dispense Refill  . acetaminophen (TYLENOL) 500 MG tablet Take 1,000 mg by mouth every 6 (six) hours as  needed for mild pain.    Marland Kitchen BYDUREON 2 MG PEN Inject 2 mg as directed once a week.     Marland Kitchen levonorgestrel (MIRENA) 20 MCG/24HR IUD 1 each by Intrauterine route once.    Marland Kitchen losartan (COZAAR) 100 MG tablet Take 100 mg by mouth daily.     . metFORMIN (GLUCOPHAGE) 500 MG tablet Take 500 mg by mouth 2 (two) times daily.    . Multiple Vitamin (MULTIVITAMIN WITH MINERALS) TABS Take 1 tablet by mouth at bedtime.     . pantoprazole (PROTONIX) 40 MG tablet Take 40 mg by mouth daily.    . potassium chloride SA (K-DUR,KLOR-CON) 20 MEQ tablet Take 20 mEq by mouth 2 (two) times daily.     . rosuvastatin (CRESTOR) 10 MG tablet Take 10 mg by mouth every Monday, Wednesday, and Friday.    . cephALEXin (KEFLEX) 500 MG capsule Take 1 capsule (500 mg total) 4 (four) times daily by mouth. (Patient not taking: Reported on 06/17/2019) 6 capsule 0  . cyclobenzaprine (FLEXERIL) 10 MG tablet Take 1 tablet (10 mg total) by mouth 2 (two) times daily as needed for muscle spasms. (Patient not taking: Reported on 06/17/2019) 20 tablet 0  . estradiol (CLIMARA - DOSED IN MG/24 HR) 0.05 mg/24hr patch Place 0.05 mg onto the skin once a week.    Marland Kitchen HYDROcodone-acetaminophen (NORCO/VICODIN) 5-325 MG tablet Take 1-2 tablets by mouth every 4 (four) hours as needed for moderate pain. (Patient not taking: Reported on 06/17/2019) 10 tablet 0  . sucralfate (CARAFATE) 1 GM/10ML suspension  Take 10 mLs (1 g total) by mouth 4 (four) times daily -  with meals and at bedtime. (Patient not taking: Reported on 06/17/2019) 420 mL 0   Biaxin [clarithromycin] Family History  Problem Relation Age of Onset  . Cancer Mother   . Hyperlipidemia Mother   . Osteoporosis Mother   . Heart disease Father   . Diabetes Father   . Hypertension Father    Social History:   reports that she has never smoked. She has never used smokeless tobacco. She reports that she does not drink alcohol or use drugs.   REVIEW OF SYSTEMS : Negative except for see problem  list  Physical Exam:   There were no vitals taken for this visit. There is no height or weight on file to calculate BMI.  Gen:  W[facility]N AAF NAD  Neurological: Alert and oriented to person, place, and time. Motor and sensory function is grossly intact  Head: Normocephalic and atraumatic.  Eyes: Conjunctivae are normal. Pupils are equal, round, and reactive to light. No scleral icterus.  Neck: Normal range of motion. Neck supple. No tracheal deviation or thyromegaly present.  Cardiovascular:  SR without murmurs or gallops.  No carotid bruits Breast:  Not examined Respiratory: Effort normal.  No respiratory distress. No chest wall tenderness. Breath sounds normal.  No wheezes, rales or rhonchi.  Abdomen:  Port palpable on the right GU:  Not examined Musculoskeletal: Normal range of motion. Extremities are nontender. No cyanosis, edema or clubbing noted Lymphadenopathy: No cervical, preauricular, postauricular or axillary adenopathy is present Skin: Skin is warm and dry. No rash noted. No diaphoresis. No erythema. No pallor. Pscyh: Normal mood and affect. Behavior is normal. Judgment and thought content normal.   LABORATORY RESULTS: No results found for this or any previous visit (from the past 48 hour(s)).   RADIOLOGY RESULTS: No results found.  Problem List: Patient Active Problem List   Diagnosis Date Noted  . Lapband APS + [facility] repair Jan 2009 03/08/2012  . Chest pain 07/26/2011  . Fitting and adjustment of gastric lap band 06/29/2011  . URI 05/26/2009  . DM 05/14/2009  . ALLERGIC RHINITIS 05/14/2009  . COUGH, CHRONIC 05/14/2009  . POLYCYSTIC OVARIAN DISEASE 05/13/2009  . HYPERTENSION 05/13/2009    Assessment & Plan: Lapband failure-for removal and conversion to sleeve gastrectomy    Matt B. Hassell Done, MD, [facility] [phone] beeper [phone]  06/30/2019 4:24 PM

## 2019-07-01 ENCOUNTER — Other Ambulatory Visit: Payer: Self-pay

## 2019-07-01 ENCOUNTER — Inpatient Hospital Stay (HOSPITAL_COMMUNITY)
Admission: RE | Admit: 2019-07-01 | Discharge: 2019-07-03 | DRG: 327 | Disposition: A | Discharge status: Home / Self Care | Payer: 59 | Source: Ambulatory Visit | Attending: Surgery | Admitting: Surgery

## 2019-07-01 ENCOUNTER — Inpatient Hospital Stay (HOSPITAL_COMMUNITY): Payer: 59 | Admitting: Physician Assistant

## 2019-07-01 ENCOUNTER — Inpatient Hospital Stay (HOSPITAL_COMMUNITY): Payer: 59 | Admitting: Certified Registered Nurse Anesthetist

## 2019-07-01 ENCOUNTER — Encounter (HOSPITAL_COMMUNITY)
Admission: RE | Disposition: A | Discharge status: Home / Self Care | Payer: Self-pay | Source: Ambulatory Visit | Attending: Surgery

## 2019-07-01 ENCOUNTER — Encounter (HOSPITAL_COMMUNITY): Payer: Self-pay | Admitting: Surgery

## 2019-07-01 DIAGNOSIS — K9509 Other complications of gastric band procedure: Principal | ICD-10-CM | POA: Diagnosis present

## 2019-07-01 DIAGNOSIS — I10 Essential (primary) hypertension: Secondary | ICD-10-CM | POA: Diagnosis present

## 2019-07-01 DIAGNOSIS — T85518A Breakdown (mechanical) of other gastrointestinal prosthetic devices, implants and grafts, initial encounter: Secondary | ICD-10-CM | POA: Diagnosis present

## 2019-07-01 DIAGNOSIS — Z83438 Family history of other disorder of lipoprotein metabolism and other lipidemia: Secondary | ICD-10-CM | POA: Diagnosis not present

## 2019-07-01 DIAGNOSIS — Z6827 Body mass index (BMI) 27.0-27.9, adult: Secondary | ICD-10-CM | POA: Diagnosis not present

## 2019-07-01 DIAGNOSIS — Z8249 Family history of ischemic heart disease and other diseases of the circulatory system: Secondary | ICD-10-CM | POA: Diagnosis not present

## 2019-07-01 DIAGNOSIS — Z833 Family history of diabetes mellitus: Secondary | ICD-10-CM | POA: Diagnosis not present

## 2019-07-01 DIAGNOSIS — Z881 Allergy status to other antibiotic agents status: Secondary | ICD-10-CM | POA: Diagnosis not present

## 2019-07-01 DIAGNOSIS — K224 Dyskinesia of esophagus: Secondary | ICD-10-CM | POA: Diagnosis present

## 2019-07-01 DIAGNOSIS — Z79899 Other long term (current) drug therapy: Secondary | ICD-10-CM

## 2019-07-01 DIAGNOSIS — Z20822 Contact with and (suspected) exposure to covid-19: Secondary | ICD-10-CM | POA: Diagnosis present

## 2019-07-01 DIAGNOSIS — E282 Polycystic ovarian syndrome: Secondary | ICD-10-CM | POA: Diagnosis present

## 2019-07-01 DIAGNOSIS — K219 Gastro-esophageal reflux disease without esophagitis: Secondary | ICD-10-CM | POA: Diagnosis present

## 2019-07-01 DIAGNOSIS — K449 Diaphragmatic hernia without obstruction or gangrene: Secondary | ICD-10-CM | POA: Diagnosis present

## 2019-07-01 DIAGNOSIS — Z7984 Long term (current) use of oral hypoglycemic drugs: Secondary | ICD-10-CM | POA: Diagnosis not present

## 2019-07-01 DIAGNOSIS — Z87410 Personal history of cervical dysplasia: Secondary | ICD-10-CM | POA: Diagnosis not present

## 2019-07-01 DIAGNOSIS — K228 Other specified diseases of esophagus: Secondary | ICD-10-CM | POA: Diagnosis present

## 2019-07-01 DIAGNOSIS — E1140 Type 2 diabetes mellitus with diabetic neuropathy, unspecified: Secondary | ICD-10-CM | POA: Diagnosis present

## 2019-07-01 DIAGNOSIS — Z809 Family history of malignant neoplasm, unspecified: Secondary | ICD-10-CM | POA: Diagnosis not present

## 2019-07-01 DIAGNOSIS — Z9884 Bariatric surgery status: Secondary | ICD-10-CM

## 2019-07-01 DIAGNOSIS — Z8262 Family history of osteoporosis: Secondary | ICD-10-CM | POA: Diagnosis not present

## 2019-07-01 HISTORY — PX: LAPAROSCOPIC GASTRIC BAND REMOVAL WITH LAPAROSCOPIC GASTRIC SLEEVE RESECTION: SHX6498

## 2019-07-01 LAB — CBC
HCT: 38.6 % (ref 36.0–46.0)
Hemoglobin: 11.9 g/dL — ABNORMAL LOW (ref 12.0–15.0)
MCH: 27 pg (ref 26.0–34.0)
MCHC: 30.8 g/dL (ref 30.0–36.0)
MCV: 87.7 fL (ref 80.0–100.0)
Platelets: 212 K/uL (ref 150–400)
RBC: 4.4 MIL/uL (ref 3.87–5.11)
RDW: 14.1 % (ref 11.5–15.5)
WBC: 20.5 K/uL — ABNORMAL HIGH (ref 4.0–10.5)
nRBC: 0 % (ref 0.0–0.2)

## 2019-07-01 LAB — GLUCOSE, CAPILLARY
Glucose-Capillary: 131 mg/dL — ABNORMAL HIGH (ref 70–99)
Glucose-Capillary: 227 mg/dL — ABNORMAL HIGH (ref 70–99)
Glucose-Capillary: 202 mg/dL — ABNORMAL HIGH (ref 70–99)
Glucose-Capillary: 207 mg/dL — ABNORMAL HIGH (ref 70–99)

## 2019-07-01 LAB — HEMOGLOBIN AND HEMATOCRIT, BLOOD
HCT: 38.8 % (ref 36.0–46.0)
Hemoglobin: 12 g/dL (ref 12.0–15.0)

## 2019-07-01 LAB — CREATININE, SERUM
Creatinine, Ser: 1.12 mg/dL — ABNORMAL HIGH (ref 0.44–1.00)
GFR calc Af Amer: >60 mL/min (ref >60)
GFR calc non Af Amer: 56 mL/min — ABNORMAL LOW (ref >60)

## 2019-07-01 LAB — PREGNANCY, URINE: Preg Test, Ur: NEGATIVE

## 2019-07-01 SURGERY — LAPAROSCOPIC GASTRIC BAND REMOVAL WITH LAPAROSCOPIC GASTRIC SLEEVE RESECTION
Anesthesia: General | Site: Abdomen

## 2019-07-01 MED ORDER — HYDRALAZINE HCL 20 MG/ML IJ SOLN
20.0000 mg | Freq: Once | INTRAMUSCULAR | Status: AC
  Administered 2019-07-01: 20 mg via INTRAVENOUS
  Filled 2019-07-01: qty 1

## 2019-07-01 MED ORDER — SCOPOLAMINE 1 MG/3DAYS TD PT72
1.0000 | MEDICATED_PATCH | TRANSDERMAL | Status: DC
  Administered 2019-07-01: 1 via TRANSDERMAL
  Filled 2019-07-01: qty 1

## 2019-07-01 MED ORDER — METOPROLOL TARTRATE 5 MG/5ML IV SOLN
5.0000 mg | Freq: Four times a day (QID) | INTRAVENOUS | Status: DC | PRN
  Administered 2019-07-01 – 2019-07-02 (×3): 5 mg via INTRAVENOUS
  Filled 2019-07-01 (×3): qty 5

## 2019-07-01 MED ORDER — SUCCINYLCHOLINE CHLORIDE 200 MG/10ML IV SOSY
PREFILLED_SYRINGE | INTRAVENOUS | Status: AC
  Filled 2019-07-01: qty 10

## 2019-07-01 MED ORDER — LABETALOL HCL 5 MG/ML IV SOLN
INTRAVENOUS | Status: DC | PRN
  Administered 2019-07-01 (×2): 2.5 mg via INTRAVENOUS

## 2019-07-01 MED ORDER — PROMETHAZINE HCL 25 MG/ML IJ SOLN: 6.2500 mg | INTRAMUSCULAR | Status: DC | PRN

## 2019-07-01 MED ORDER — LABETALOL HCL 5 MG/ML IV SOLN
INTRAVENOUS | Status: AC
  Filled 2019-07-01: qty 4

## 2019-07-01 MED ORDER — EPHEDRINE SULFATE-NACL 50-0.9 MG/10ML-% IV SOSY
PREFILLED_SYRINGE | INTRAVENOUS | Status: DC | PRN
  Administered 2019-07-01: 5 mg via INTRAVENOUS

## 2019-07-01 MED ORDER — ACETAMINOPHEN 160 MG/5ML PO SOLN
1000.0000 mg | Freq: Three times a day (TID) | ORAL | Status: DC
  Administered 2019-07-02 (×2): 1000 mg via ORAL
  Filled 2019-07-01 (×3): qty 40.6

## 2019-07-01 MED ORDER — FENTANYL CITRATE (PF) 100 MCG/2ML IJ SOLN
INTRAMUSCULAR | Status: AC
  Filled 2019-07-01: qty 2

## 2019-07-01 MED ORDER — KETAMINE HCL 10 MG/ML IJ SOLN
INTRAMUSCULAR | Status: AC
  Filled 2019-07-01: qty 1

## 2019-07-01 MED ORDER — APREPITANT 40 MG PO CAPS
40.0000 mg | ORAL_CAPSULE | ORAL | Status: AC
  Administered 2019-07-01: 06:00:00 40 mg via ORAL
  Filled 2019-07-01: qty 1

## 2019-07-01 MED ORDER — KETAMINE HCL 10 MG/ML IJ SOLN
INTRAMUSCULAR | Status: DC | PRN
  Administered 2019-07-01 (×2): 15 mg via INTRAVENOUS

## 2019-07-01 MED ORDER — DEXAMETHASONE SODIUM PHOSPHATE 10 MG/ML IJ SOLN
INTRAMUSCULAR | Status: AC
  Filled 2019-07-01: qty 1

## 2019-07-01 MED ORDER — GABAPENTIN 300 MG PO CAPS
300.0000 mg | ORAL_CAPSULE | ORAL | Status: AC
  Administered 2019-07-01: 300 mg via ORAL
  Filled 2019-07-01: qty 1

## 2019-07-01 MED ORDER — DEXAMETHASONE SODIUM PHOSPHATE 10 MG/ML IJ SOLN
INTRAMUSCULAR | Status: DC | PRN
  Administered 2019-07-01: 8 mg via INTRAVENOUS

## 2019-07-01 MED ORDER — HEPARIN SODIUM (PORCINE) 5000 UNIT/ML IJ SOLN
5000.0000 [IU] | INTRAMUSCULAR | Status: AC
  Administered 2019-07-01: 06:00:00 5000 [IU] via SUBCUTANEOUS
  Filled 2019-07-01: qty 1

## 2019-07-01 MED ORDER — HYDROMORPHONE HCL 1 MG/ML IJ SOLN
0.2500 mg | INTRAMUSCULAR | Status: DC | PRN
  Administered 2019-07-01 (×2): 0.25 mg via INTRAVENOUS

## 2019-07-01 MED ORDER — SODIUM CHLORIDE 0.9 % IV SOLN
2.0000 g | INTRAVENOUS | Status: AC
  Administered 2019-07-01: 2 g via INTRAVENOUS
  Filled 2019-07-01: qty 2

## 2019-07-01 MED ORDER — INSULIN ASPART 100 UNIT/ML ~~LOC~~ SOLN
0.0000 [IU] | SUBCUTANEOUS | Status: DC
  Administered 2019-07-01 – 2019-07-02 (×3): 7 [IU] via SUBCUTANEOUS
  Administered 2019-07-02: 4 [IU] via SUBCUTANEOUS
  Administered 2019-07-02: 11:00:00 3 [IU] via SUBCUTANEOUS
  Administered 2019-07-02: 4 [IU] via SUBCUTANEOUS
  Administered 2019-07-02 (×2): 7 [IU] via SUBCUTANEOUS
  Administered 2019-07-03: 4 [IU] via SUBCUTANEOUS
  Administered 2019-07-03 (×2): 3 [IU] via SUBCUTANEOUS

## 2019-07-01 MED ORDER — ENSURE MAX PROTEIN PO LIQD: 2.0000 [oz_av] | ORAL | Status: DC

## 2019-07-01 MED ORDER — CHLORHEXIDINE GLUCONATE CLOTH 2 % EX PADS: 6.0000 | MEDICATED_PAD | Freq: Once | CUTANEOUS | Status: DC

## 2019-07-01 MED ORDER — ONDANSETRON HCL 4 MG/2ML IJ SOLN
4.0000 mg | INTRAMUSCULAR | Status: DC | PRN
  Administered 2019-07-01: 4 mg via INTRAVENOUS
  Filled 2019-07-01 (×2): qty 2

## 2019-07-01 MED ORDER — PROPOFOL 10 MG/ML IV BOLUS
INTRAVENOUS | Status: DC | PRN
  Administered 2019-07-01: 50 mg via INTRAVENOUS
  Administered 2019-07-01: 150 mg via INTRAVENOUS

## 2019-07-01 MED ORDER — OXYCODONE HCL 5 MG/5ML PO SOLN: 5.0000 mg | Freq: Four times a day (QID) | ORAL | Status: DC | PRN

## 2019-07-01 MED ORDER — MIDAZOLAM HCL 5 MG/5ML IJ SOLN
INTRAMUSCULAR | Status: DC | PRN
  Administered 2019-07-01: 2 mg via INTRAVENOUS

## 2019-07-01 MED ORDER — HYDROMORPHONE HCL 1 MG/ML IJ SOLN
INTRAMUSCULAR | Status: AC
  Filled 2019-07-01: qty 1

## 2019-07-01 MED ORDER — INSULIN ASPART 100 UNIT/ML ~~LOC~~ SOLN
SUBCUTANEOUS | Status: AC
  Filled 2019-07-01: qty 1

## 2019-07-01 MED ORDER — EPHEDRINE 5 MG/ML INJ
INTRAVENOUS | Status: AC
  Filled 2019-07-01: qty 10

## 2019-07-01 MED ORDER — LIDOCAINE 2% (20 MG/ML) 5 ML SYRINGE
INTRAMUSCULAR | Status: AC
  Filled 2019-07-01: qty 5

## 2019-07-01 MED ORDER — SODIUM CHLORIDE (PF) 0.9 % IJ SOLN
INTRAMUSCULAR | Status: DC | PRN
  Administered 2019-07-01: 10 mL

## 2019-07-01 MED ORDER — LACTATED RINGERS IV SOLN: INTRAVENOUS | Status: DC

## 2019-07-01 MED ORDER — ESMOLOL HCL 100 MG/10ML IV SOLN
INTRAVENOUS | Status: DC | PRN
  Administered 2019-07-01: 20 mg via INTRAVENOUS

## 2019-07-01 MED ORDER — SODIUM CHLORIDE (PF) 0.9 % IJ SOLN
INTRAMUSCULAR | Status: AC
  Filled 2019-07-01: qty 10

## 2019-07-01 MED ORDER — LABETALOL HCL 5 MG/ML IV SOLN
5.0000 mg | Freq: Once | INTRAVENOUS | Status: AC
  Administered 2019-07-01: 11:00:00 5 mg via INTRAVENOUS

## 2019-07-01 MED ORDER — MEPERIDINE HCL 50 MG/ML IJ SOLN: 6.2500 mg | INTRAMUSCULAR | Status: DC | PRN

## 2019-07-01 MED ORDER — LIDOCAINE 2% (20 MG/ML) 5 ML SYRINGE
INTRAMUSCULAR | Status: DC | PRN
  Administered 2019-07-01: 1.5 mg/kg/h via INTRAVENOUS
  Administered 2019-07-01: 80 mg via INTRAVENOUS

## 2019-07-01 MED ORDER — HEPARIN SODIUM (PORCINE) 5000 UNIT/ML IJ SOLN
5000.0000 [IU] | Freq: Three times a day (TID) | INTRAMUSCULAR | Status: DC
  Administered 2019-07-01 – 2019-07-03 (×6): 5000 [IU] via SUBCUTANEOUS
  Filled 2019-07-01 (×6): qty 1

## 2019-07-01 MED ORDER — ONDANSETRON HCL 4 MG/2ML IJ SOLN
INTRAMUSCULAR | Status: AC
  Filled 2019-07-01: qty 2

## 2019-07-01 MED ORDER — ONDANSETRON HCL 4 MG/2ML IJ SOLN
INTRAMUSCULAR | Status: DC | PRN
  Administered 2019-07-01: 4 mg via INTRAVENOUS

## 2019-07-01 MED ORDER — BUPIVACAINE LIPOSOME 1.3 % IJ SUSP
20.0000 mL | Freq: Once | INTRAMUSCULAR | Status: AC
  Administered 2019-07-01: 20 mL
  Filled 2019-07-01: qty 20

## 2019-07-01 MED ORDER — ACETAMINOPHEN 500 MG PO TABS
1000.0000 mg | ORAL_TABLET | Freq: Three times a day (TID) | ORAL | Status: DC
  Administered 2019-07-02 – 2019-07-03 (×2): 1000 mg via ORAL
  Filled 2019-07-01 (×2): qty 2

## 2019-07-01 MED ORDER — INSULIN ASPART 100 UNIT/ML ~~LOC~~ SOLN
4.0000 [IU] | Freq: Once | SUBCUTANEOUS | Status: AC
  Administered 2019-07-01: 4 [IU] via SUBCUTANEOUS

## 2019-07-01 MED ORDER — LABETALOL HCL 5 MG/ML IV SOLN
5.0000 mg | Freq: Once | INTRAVENOUS | Status: AC
  Administered 2019-07-01: 12:00:00 5 mg via INTRAVENOUS

## 2019-07-01 MED ORDER — MORPHINE SULFATE (PF) 2 MG/ML IV SOLN
1.0000 mg | INTRAVENOUS | Status: DC | PRN
  Administered 2019-07-01 – 2019-07-02 (×4): 2 mg via INTRAVENOUS
  Filled 2019-07-01 (×4): qty 1

## 2019-07-01 MED ORDER — MIDAZOLAM HCL 2 MG/2ML IJ SOLN
INTRAMUSCULAR | Status: AC
  Filled 2019-07-01: qty 2

## 2019-07-01 MED ORDER — SUGAMMADEX SODIUM 200 MG/2ML IV SOLN
INTRAVENOUS | Status: DC | PRN
  Administered 2019-07-01: 200 mg via INTRAVENOUS

## 2019-07-01 MED ORDER — FENTANYL CITRATE (PF) 100 MCG/2ML IJ SOLN
INTRAMUSCULAR | Status: DC | PRN
  Administered 2019-07-01 (×6): 50 ug via INTRAVENOUS

## 2019-07-01 MED ORDER — KCL IN DEXTROSE-NACL 20-5-0.45 MEQ/L-%-% IV SOLN
INTRAVENOUS | Status: DC
  Filled 2019-07-01 (×5): qty 1000

## 2019-07-01 MED ORDER — PANTOPRAZOLE SODIUM 40 MG IV SOLR
40.0000 mg | Freq: Every day | INTRAVENOUS | Status: DC
  Administered 2019-07-01 – 2019-07-02 (×2): 40 mg via INTRAVENOUS
  Filled 2019-07-01 (×2): qty 40

## 2019-07-01 MED ORDER — PROPOFOL 10 MG/ML IV BOLUS
INTRAVENOUS | Status: AC
  Filled 2019-07-01: qty 20

## 2019-07-01 MED ORDER — LIDOCAINE HCL 2 % IJ SOLN
INTRAMUSCULAR | Status: AC
  Filled 2019-07-01: qty 20

## 2019-07-01 MED ORDER — ROCURONIUM BROMIDE 10 MG/ML (PF) SYRINGE
PREFILLED_SYRINGE | INTRAVENOUS | Status: DC | PRN
  Administered 2019-07-01: 5 mg via INTRAVENOUS
  Administered 2019-07-01: 10 mg via INTRAVENOUS
  Administered 2019-07-01: 55 mg via INTRAVENOUS
  Administered 2019-07-01: 5 mg via INTRAVENOUS

## 2019-07-01 MED ORDER — LACTATED RINGERS IR SOLN
Status: DC | PRN
  Administered 2019-07-01: 1000 mL

## 2019-07-01 MED ORDER — ROCURONIUM BROMIDE 10 MG/ML (PF) SYRINGE
PREFILLED_SYRINGE | INTRAVENOUS | Status: AC
  Filled 2019-07-01: qty 10

## 2019-07-01 MED ORDER — 0.9 % SODIUM CHLORIDE (POUR BTL) OPTIME
TOPICAL | Status: DC | PRN
  Administered 2019-07-01: 1000 mL

## 2019-07-01 MED ORDER — ESMOLOL HCL 100 MG/10ML IV SOLN
INTRAVENOUS | Status: AC
  Filled 2019-07-01: qty 10

## 2019-07-01 MED ORDER — SUCCINYLCHOLINE CHLORIDE 200 MG/10ML IV SOSY
PREFILLED_SYRINGE | INTRAVENOUS | Status: DC | PRN
  Administered 2019-07-01: 120 mg via INTRAVENOUS

## 2019-07-01 MED ORDER — ACETAMINOPHEN 500 MG PO TABS
1000.0000 mg | ORAL_TABLET | ORAL | Status: AC
  Administered 2019-07-01: 1000 mg via ORAL
  Filled 2019-07-01: qty 2

## 2019-07-01 SURGICAL SUPPLY — 75 items
ADH SKN CLS APL DERMABOND .7 (GAUZE/BANDAGES/DRESSINGS) ×1
APL SWBSTK 6 STRL LF DISP (MISCELLANEOUS)
APPLICATOR COTTON TIP 6 STRL (MISCELLANEOUS) IMPLANT
APPLICATOR COTTON TIP 6IN STRL (MISCELLANEOUS)
APPLIER CLIP 5 13 M/L LIGAMAX5 (MISCELLANEOUS)
APPLIER CLIP ROT 10 11.4 M/L (STAPLE)
APPLIER CLIP ROT 13.4 12 LRG (CLIP)
APR CLP LRG 13.4X12 ROT 20 MLT (CLIP)
APR CLP MED LRG 11.4X10 (STAPLE)
APR CLP MED LRG 5 ANG JAW (MISCELLANEOUS)
BLADE SURG 15 STRL LF DISP TIS (BLADE) ×1 IMPLANT
BLADE SURG 15 STRL SS (BLADE) ×3
CABLE HIGH FREQUENCY MONO STRZ (ELECTRODE) IMPLANT
CLIP APPLIE 5 13 M/L LIGAMAX5 (MISCELLANEOUS) IMPLANT
CLIP APPLIE ROT 10 11.4 M/L (STAPLE) IMPLANT
CLIP APPLIE ROT 13.4 12 LRG (CLIP) IMPLANT
COVER WAND RF STERILE (DRAPES) IMPLANT
DERMABOND ADVANCED (GAUZE/BANDAGES/DRESSINGS) ×2
DERMABOND ADVANCED .7 DNX12 (GAUZE/BANDAGES/DRESSINGS) IMPLANT
DEVICE SUT QUICK LOAD TK 5 (STAPLE) IMPLANT
DEVICE SUT TI-KNOT TK 5X26 (MISCELLANEOUS) IMPLANT
DEVICE SUTURE ENDOST 10MM (ENDOMECHANICALS) IMPLANT
DEVICE TI KNOT TK5 (MISCELLANEOUS)
DISSECTOR BLUNT TIP ENDO 5MM (MISCELLANEOUS) IMPLANT
ELECT L-HOOK LAP 45CM DISP (ELECTROSURGICAL) ×3
ELECT PENCIL ROCKER SW 15FT (MISCELLANEOUS) ×3 IMPLANT
ELECT REM PT RETURN 15FT ADLT (MISCELLANEOUS) ×3 IMPLANT
ELECTRODE L-HOOK LAP 45CM DISP (ELECTROSURGICAL) IMPLANT
GAUZE SPONGE 4X4 12PLY STRL (GAUZE/BANDAGES/DRESSINGS) IMPLANT
GLOVE BIOGEL M 8.0 STRL (GLOVE) ×3 IMPLANT
GOWN STRL REUS W/TWL XL LVL3 (GOWN DISPOSABLE) ×6 IMPLANT
GRASPER SUT TROCAR 14GX15 (MISCELLANEOUS) ×3 IMPLANT
HANDLE STAPLE EGIA 4 XL (STAPLE) ×3 IMPLANT
HOVERMATT SINGLE USE (MISCELLANEOUS) ×3 IMPLANT
KIT BASIN OR (CUSTOM PROCEDURE TRAY) ×3 IMPLANT
KIT TURNOVER KIT A (KITS) ×2 IMPLANT
MARKER SKIN DUAL TIP RULER LAB (MISCELLANEOUS) IMPLANT
NDL SPNL 22GX3.5 QUINCKE BK (NEEDLE) ×1 IMPLANT
NEEDLE SPNL 22GX3.5 QUINCKE BK (NEEDLE) ×3 IMPLANT
PACK UNIVERSAL I (CUSTOM PROCEDURE TRAY) ×3 IMPLANT
QUICK LOAD TK 5 (STAPLE)
RELOAD STAPLE 45 PURP MED/THCK (STAPLE) IMPLANT
RELOAD TRI 45 ART MED THCK BLK (STAPLE) ×3 IMPLANT
RELOAD TRI 45 ART MED THCK PUR (STAPLE) IMPLANT
RELOAD TRI 60 ART MED THCK BLK (STAPLE) ×11 IMPLANT
RELOAD TRI 60 ART MED THCK PUR (STAPLE) IMPLANT
SCISSORS LAP 5X45 EPIX DISP (ENDOMECHANICALS) ×3 IMPLANT
SET IRRIG TUBING LAPAROSCOPIC (IRRIGATION / IRRIGATOR) ×3 IMPLANT
SET TUBE SMOKE EVAC HIGH FLOW (TUBING) ×3 IMPLANT
SHEARS HARMONIC ACE PLUS 45CM (MISCELLANEOUS) ×3 IMPLANT
SLEEVE ADV FIXATION 5X100MM (TROCAR) ×6 IMPLANT
SLEEVE GASTRECTOMY 36FR VISIGI (MISCELLANEOUS) ×3 IMPLANT
SOL ANTI FOG 6CC (MISCELLANEOUS) ×1 IMPLANT
SOLUTION ANTI FOG 6CC (MISCELLANEOUS) ×2
SPONGE LAP 18X18 RF (DISPOSABLE) ×3 IMPLANT
STAPLER VISISTAT 35W (STAPLE) IMPLANT
SUT MNCRL AB 4-0 PS2 18 (SUTURE) ×6 IMPLANT
SUT SURGIDAC NAB ES-9 0 48 120 (SUTURE) IMPLANT
SUT VIC AB 2-0 SH 27 (SUTURE) ×3
SUT VIC AB 2-0 SH 27X BRD (SUTURE) ×1 IMPLANT
SUT VICRYL 0 TIES 12 18 (SUTURE) ×3 IMPLANT
SYR 10ML ECCENTRIC (SYRINGE) ×3 IMPLANT
SYR 20ML LL LF (SYRINGE) ×3 IMPLANT
SYR 50ML LL SCALE MARK (SYRINGE) ×3 IMPLANT
TOWEL OR 17X26 10 PK STRL BLUE (TOWEL DISPOSABLE) ×3 IMPLANT
TOWEL OR NON WOVEN STRL DISP B (DISPOSABLE) ×3 IMPLANT
TRAY FOLEY MTR SLVR 14FR STAT (SET/KITS/TRAYS/PACK) ×2 IMPLANT
TRAY FOLEY MTR SLVR 16FR STAT (SET/KITS/TRAYS/PACK) IMPLANT
TROCAR ADV FIXATION 5X100MM (TROCAR) ×3 IMPLANT
TROCAR BLADELESS 15MM (ENDOMECHANICALS) ×3 IMPLANT
TROCAR BLADELESS OPT 5 100 (ENDOMECHANICALS) ×3 IMPLANT
TUBE CALIBRATION LAPBAND (TUBING) IMPLANT
TUBING CONNECTING 10 (TUBING) ×4 IMPLANT
TUBING CONNECTING 10' (TUBING) ×2
TUBING ENDO SMARTCAP (MISCELLANEOUS) ×3 IMPLANT

## 2019-07-01 NOTE — Transfer of Care (Signed)
Immediate Anesthesia Transfer of Care Note  Patient: Stacey Riddle  Procedure(s) Performed: LAPAROSCOPIC GASTRIC BAND REMOVAL WITH LAPAROSCOPIC GASTRIC SLEEVE RESECTION, Upper Endo, ERAS Pathway (N/A Abdomen)  Patient Location: PACU  Anesthesia Type:General  Level of Consciousness: awake, alert , oriented and patient cooperative  Airway & Oxygen Therapy: Patient Spontanous Breathing and Patient connected to face mask oxygen  Post-op Assessment: Report given to RN, Post -op Vital signs reviewed and stable and Patient moving all extremities  Post vital signs: Reviewed and stable  Last Vitals:  Vitals Value Taken Time  BP 202/104 07/01/19 1055  Temp 36.5 C 07/01/19 1055  Pulse 89 07/01/19 1058  Resp 19 07/01/19 1058  SpO2 100 % 07/01/19 1058  Vitals shown include unvalidated device data.  Last Pain:  Vitals:   07/01/19 1055  TempSrc:   PainSc: (P) Asleep         Complications: No apparent anesthesia complications

## 2019-07-01 NOTE — Progress Notes (Signed)
Notified MD of bp of 216/89 after Lopressor given. Order was given.

## 2019-07-01 NOTE — Anesthesia Postprocedure Evaluation (Signed)
Anesthesia Post Note  Patient: Makailee Chrisp-Doyle  Procedure(s) Performed: LAPAROSCOPIC GASTRIC BAND REMOVAL WITH LAPAROSCOPIC GASTRIC SLEEVE RESECTION, Upper Endo, ERAS Pathway (N/A Abdomen)     Patient location during evaluation: PACU Anesthesia Type: General Level of consciousness: awake and alert and oriented Pain management: pain level controlled Vital Signs Assessment: post-procedure vital signs reviewed and stable Respiratory status: spontaneous breathing, nonlabored ventilation, respiratory function stable and patient connected to nasal cannula oxygen Cardiovascular status: blood pressure returned to baseline and stable Postop Assessment: no apparent nausea or vomiting Anesthetic complications: no    Last Vitals:  Vitals:   07/01/19 1130 07/01/19 1145  BP: (!) 204/104 (!) 171/109  Pulse: 74 68  Resp: 15 13  Temp:    SpO2: 100% 100%    Last Pain:  Vitals:   07/01/19 1130  TempSrc:   PainSc: 3                  Areana Kosanke A.

## 2019-07-01 NOTE — Discharge Instructions (Signed)
° ° ° °GASTRIC BYPASS/SLEEVE ° Home Care Instructions ° ° These instructions are to help you care for yourself when you go home. ° °Call: If you have any problems. °• Call [phone] and ask for the surgeon on call °• If you need immediate help, come to the ER at [facility].  °• Tell the ER staff that you are a new post-op gastric bypass or gastric sleeve patient °  °Signs and symptoms to report: • Severe vomiting or nausea °o If you cannot keep down clear liquids for longer than 1 day, call your surgeon  °• Abdominal pain that does not get better after taking your pain medication °• Fever over 100.4° F with chills °• Heart beating over 100 beats a minute °• Shortness of breath at rest °• Chest pain °•  Redness, swelling, drainage, or foul odor at incision (surgical) sites °•  If your incisions open or pull apart °• Swelling or pain in calf (lower leg) °• Diarrhea (Loose bowel movements that happen often), frequent watery, uncontrolled bowel movements °• Constipation, (no bowel movements for 3 days) if this happens: Pick one °o Milk of Magnesia, 2 tablespoons by mouth, 3 times a day for 2 days if needed °o Stop taking Milk of Magnesia once you have a bowel movement °o Call your doctor if constipation continues °Or °o Miralax  (instead of Milk of Magnesia) following the label instructions °o Stop taking Miralax once you have a bowel movement °o Call your doctor if constipation continues °• Anything you think is not normal °  °Normal side effects after surgery: • Unable to sleep at night or unable to focus °• Irritability or moody °• Being tearful (crying) or depressed °These are common complaints, possibly related to your anesthesia medications that put you to sleep, stress of surgery, and change in lifestyle.  This usually goes away a few weeks after surgery.  If these feelings continue, call your primary care doctor. °  °Wound Care: You may have surgical glue, steri-strips, or staples over your incisions after  surgery °• Surgical glue:  Looks like a clear film over your incisions and will wear off a little at a time °• Steri-strips: Strips of tape over your incisions. You may notice a yellowish color on the skin under the steri-strips. This is used to make the   steri-strips stick better. Do not pull the steri-strips off - let them fall off °• Staples: Staples may be removed before you leave the hospital °o If you go home with staples, call [facility], [phone] at for an appointment with your surgeon’s nurse to have staples removed 10 days after surgery. °• Showering: You may shower two (2) days after your surgery unless your surgeon tells you differently °o Wash gently around incisions with warm soapy water, rinse well, and gently pat dry  °o No tub baths until staples are removed, steri-strips fall off or glue is gone.  °  °Medications: • Medications should be liquid or crushed if larger than the size of a dime °• Extended release pills (medication that release a little bit at a time through the day) should NOT be crushed or cut. (examples include XL, ER, DR, SR) °• Depending on the size and number of medications you take, you may need to space (take a few throughout the day)/change the time you take your medications so that you do not over-fill your pouch (smaller stomach) °• Make sure you follow-up with your primary care doctor to   make medication changes needed during rapid weight loss and life-style changes °• If you have diabetes, follow up with the doctor that orders your diabetes medication(s) within one week after surgery and check your blood sugar regularly. °• Do not drive while taking prescription pain medication  °• It is ok to take Tylenol by the bottle instructions with your pain medicine or instead of your pain medicine as needed.  DO NOT TAKE NSAIDS (EXAMPLES OF NSAIDS:  IBUPROFREN/ NAPROXEN)  °Diet:                    First 2 Weeks ° You will see the dietician t about two (2) weeks  after your surgery. The dietician will increase the types of foods you can eat if you are handling liquids well: °• If you have severe vomiting or nausea and cannot keep down clear liquids lasting longer than 1 day, call your surgeon @ ([phone]) °Protein Shake °• Drink at least 2 ounces of shake 5-6 times per day °• Each serving of protein shakes (usually 8 - 12 ounces) should have: °o 15 grams of protein  °o And no more than 5 grams of carbohydrate  °• Goal for protein each day: °o Men = 80 grams per day °o Women = 60 grams per day °• Protein powder may be added to fluids such as non-fat milk or Lactaid milk or unsweetened Soy/Almond milk (limit to 35 grams added protein powder per serving) ° °Hydration °• Slowly increase the amount of water and other clear liquids as tolerated (See Acceptable Fluids) °• Slowly increase the amount of protein shake as tolerated  °•  Sip fluids slowly and throughout the day.  Do not use straws. °• May use sugar substitutes in small amounts (no more than 6 - 8 packets per day; i.e. Splenda) ° °Fluid Goal °• The first goal is to drink at least 8 ounces of protein shake/drink per day (or as directed by the nutritionist); some examples of protein shakes are Syntrax Nectar, Adkins Advantage, EAS Edge HP, and Unjury. See handout from pre-op Bariatric Education Class: °o Slowly increase the amount of protein shake you drink as tolerated °o You may find it easier to slowly sip shakes throughout the day °o It is important to get your proteins in first °• Your fluid goal is to drink 64 - 100 ounces of fluid daily °o It may take a few weeks to build up to this °• 32 oz (or more) should be clear liquids  °And  °• 32 oz (or more) should be full liquids (see below for examples) °• Liquids should not contain sugar, caffeine, or carbonation ° °Clear Liquids: °• Water or Sugar-free flavored water (i.e. Fruit H2O, Propel) °• Decaffeinated coffee or tea (sugar-free) °• Crystal Lite, Wyler’s Lite,  Minute Maid Lite °• Sugar-free Jell-O °• Bouillon or broth °• Sugar-free Popsicle:   *Less than 20 calories each; Limit 1 per day ° °Full Liquids: °Protein Shakes/Drinks + 2 choices per day of other full liquids °• Full liquids must be: °o No More Than 15 grams of Carbs per serving  °o No More Than 3 grams of Fat per serving °• Strained low-fat cream soup (except Cream of Potato or Tomato) °• Non-Fat milk °• Fat-free Lactaid Milk °• Unsweetened Soy Or Unsweetened Almond Milk °• Low Sugar yogurt (Dannon Lite & Fit, Greek yogurt; Oikos Triple Zero; Chobani Simply 100; Yoplait 100 calorie Greek - No Fruit on the Bottom) ° °  °Vitamins   and Minerals • Start 1 day after surgery unless otherwise directed by your surgeon °• 2 Chewable Bariatric Specific Multivitamin / Multimineral Supplement with iron (Example: Bariatric Advantage Multi EA) °• Chewable Calcium with Vitamin D-3 °(Example: 3 Chewable Calcium Plus 600 with Vitamin D-3) °o Take 500 mg three (3) times a day for a total of 1500 mg each day °o Do not take all 3 doses of calcium at one time as it may cause constipation, and you can only absorb 500 mg  at a time  °o Do not mix multivitamins containing iron with calcium supplements; take 2 hours apart °• Menstruating women and those with a history of anemia (a blood disease that causes weakness) may need extra iron °o Talk with your doctor to see if you need more iron °• Do not stop taking or change any vitamins or minerals until you talk to your dietitian or surgeon °• Your Dietitian and/or surgeon must approve all vitamin and mineral supplements °  °Activity and Exercise: Limit your physical activity as instructed by your doctor.  It is important to continue walking at home.  During this time, use these guidelines: °• Do not lift anything greater than ten (10) pounds for at least two (2) weeks °• Do not go back to work or drive until your surgeon says you can °• You may have sex when you feel comfortable  °o It is  VERY important for female patients to use a reliable birth control method; fertility often increases after surgery  °o All hormonal birth control will be ineffective for 30 days after surgery due to medications given during surgery a barrier method must be used. °o Do not get pregnant for at least 18 months °• Start exercising as soon as your doctor tells you that you can °o Make sure your doctor approves any physical activity °• Start with a simple walking program °• Walk 5-15 minutes each day, 7 days per week.  °• Slowly increase until you are walking 30-45 minutes per day °Consider joining our BELT program. [phone] or email [email or web address] °  °Special Instructions Things to remember: °• Use your CPAP when sleeping if this applies to you ° °• [facility] has two free Bariatric Surgery Support Groups that meet monthly °o The 3rd Thursday of each month, 6 pm, [facility]  °o The 2nd Friday of each month, 11:45 am in the private dining room in the basement of [facility] °• It is very important to keep all follow up appointments with your surgeon, dietitian, primary care physician, and behavioral health practitioner °• Routine follow up schedule with your surgeon include appointments at 2-3 weeks, 6-8 weeks, 6 months, and 1 year at a minimum.  Your surgeon may request to see you more often.   °o After the first year, please follow up with your bariatric surgeon and dietitian at least once a year in order to maintain best weight loss results °[facility]: [phone] °[facility]: [phone] °Bariatric Nurse Coordinator: [phone] °  °   Reviewed and Endorsed  °by [facility] [place] Patient Education Committee, June, 2016 °Edits Approved: Aug, 2018 ° ° ° °

## 2019-07-01 NOTE — Op Note (Signed)
Stacey Sylvan [id number] 12/03/1965 07/01/2019  Preoperative diagnosis: h/o laparoscopic adjustable gastric band, esophageal dysmotility  Postoperative diagnosis: Same with removal of Lap adjustable gastric band, conversion to sleeve gastrectomy   Procedure: upper endoscopy   Surgeon: Leighton Ruff. Gurvir Schrom M.D., FACS   Anesthesia: Gen.   Indications for procedure: 54 y.o. year old female undergoing removal of adjustable gastric band with conversion to  Laparoscopic Gastric Sleeve Resection and an EGD was requested to evaluate the new gastric sleeve.   Description of procedure: After we have completed the sleeve resection, I scrubbed out and obtained the Olympus endoscope. I gently placed endoscope in the patient's oropharynx and gently glided it down the esophagus without any difficulty under direct visualization. There was some evidence of esophageal dilation. Once I was in the gastric sleeve, I insufflated the stomach with air. I was able to cannulate and advanced the scope through the gastric sleeve. I was able to cannulate the duodenum with ease. There was some residual gastric fluid in the gastric sleeve Dr. Hassell Done had placed saline in the upper abdomen. Upon further insufflation of the gastric sleeve there was no evidence of bubbles. GE junction located at 38 cm.  Upon further inspection of the gastric sleeve, the mucosa appeared normal. There is no evidence of any mucosal abnormality. The sleeve was widely patent at the angularis. There was no evidence of bleeding. The gastric sleeve was decompressed. The scope was withdrawn. The patient tolerated this portion of the procedure well. Please see Dr Earlie Server operative note for details regarding the laparoscopic gastric sleeve resection.   Leighton Ruff. Redmond Pulling, MD, FACS  General, Bariatric, & Minimally Invasive Surgery  [facility], [place]

## 2019-07-01 NOTE — Progress Notes (Signed)
PHARMACY CONSULT FOR:  Risk Assessment for Post-Discharge VTE Following Bariatric Surgery  Post-Discharge VTE Risk Assessment: This patient's probability of 30-day post-discharge VTE is increased due to the factors marked:   Female    Age >/=60 years    BMI >/=50 kg/m2    CHF    Dyspnea at Rest    Paraplegia  X  Non-gastric-band surgery    Operation Time >/=3 hr    Return to OR     Length of Stay >/= 3 d      Hx of VTE   Hypercoagulable condition   Significant venous stasis   Predicted probability of 30-day post-discharge VTE: < 0.4%  Other patient-specific factors to consider: OR time very close to 3 hr Risk calculated at < 3hr = 0.16% Risk calculated at 3hr = 0.25% Discussed with RN Bariatric Coordinator  Recommendation for Discharge: No pharmacologic prophylaxis post-discharge  Stacey Riddle is a 54 y.o. female who underwent  Removal lap band & gastric sleeve resection on 07/01/2019   Case start: 0754 Case end: 1047   Allergies  Allergen Reactions  . Biaxin [Clarithromycin] Other (See Comments)    Stomach cramps   Patient Measurements: Height: 5' 2.5" (158.8 cm) Weight: 72 kg (158 lb 12.8 oz) IBW/kg (Calculated) : 51.25 Body mass index is 28.58 kg/m.  Recent Labs    07/01/19 1140  WBC 20.5*  HGB 11.9*  HCT 38.6  PLT 212  CREATININE 1.12*   Estimated Creatinine Clearance: 54 mL/min (A) (by C-G formula based on SCr of 1.12 mg/dL (H)).  Past Medical History:  Diagnosis Date  . GERD (gastroesophageal reflux disease)   . H/O laparoscopic adjustable gastric banding 04/09/2007  . History of cervical dysplasia   . History of hiatal hernia    S/P  REPAIR 04-09-2007  . History of kidney stones   . History of polycystic ovarian syndrome   . Hypertension   . Left ureteral stone   . Neuromuscular disorder (HCC)    neuropathy  . Type 2 diabetes mellitus ([place])   . Wears contact lenses      Medications Prior to Admission  Medication Sig Dispense  Refill Last Dose  . acetaminophen (TYLENOL) 500 MG tablet Take 1,000 mg by mouth every 6 (six) hours as needed for mild pain.   Past Week at Unknown time  . BYDUREON 2 MG PEN Inject 2 mg as directed once a week.    Past Week at Unknown time  . estradiol (CLIMARA - DOSED IN MG/24 HR) 0.05 mg/24hr patch Place 0.05 mg onto the skin once a week.   Past Month at Unknown time  . levonorgestrel (MIRENA) 20 MCG/24HR IUD 1 each by Intrauterine route once.     Marland Kitchen losartan (COZAAR) 100 MG tablet Take 100 mg by mouth daily.    06/30/2019 at Unknown time  . metFORMIN (GLUCOPHAGE) 500 MG tablet Take 500 mg by mouth 2 (two) times daily.   06/30/2019 at Unknown time  . Multiple Vitamin (MULTIVITAMIN WITH MINERALS) TABS Take 1 tablet by mouth at bedtime.    Past Week at Unknown time  . pantoprazole (PROTONIX) 40 MG tablet Take 40 mg by mouth daily.   07/01/2019 at Unknown time  . potassium chloride SA (K-DUR,KLOR-CON) 20 MEQ tablet Take 20 mEq by mouth 2 (two) times daily.    06/30/2019 at Unknown time  . rosuvastatin (CRESTOR) 10 MG tablet Take 10 mg by mouth every Monday, Wednesday, and Friday.   06/30/2019 at Unknown time  .  cephALEXin (KEFLEX) 500 MG capsule Take 1 capsule (500 mg total) 4 (four) times daily by mouth. (Patient not taking: Reported on 06/17/2019) 6 capsule 0 Not Taking at Unknown time  . cyclobenzaprine (FLEXERIL) 10 MG tablet Take 1 tablet (10 mg total) by mouth 2 (two) times daily as needed for muscle spasms. (Patient not taking: Reported on 06/17/2019) 20 tablet 0 Not Taking at Unknown time  . HYDROcodone-acetaminophen (NORCO/VICODIN) 5-325 MG tablet Take 1-2 tablets by mouth every 4 (four) hours as needed for moderate pain. (Patient not taking: Reported on 06/17/2019) 10 tablet 0 Not Taking at Unknown time  . sucralfate (CARAFATE) 1 GM/10ML suspension Take 10 mLs (1 g total) by mouth 4 (four) times daily -  with meals and at bedtime. (Patient not taking: Reported on 06/17/2019) 420 mL 0 Not Taking at Unknown  time   Minda Ditto PharmD 07/01/2019,1:57 PM

## 2019-07-01 NOTE — Op Note (Signed)
01 July 2019  Surgeon: Kaylyn Lim, MD, FACS  Asst:  Greer Pickerel, MD, FACS  Anes:  General endotracheal  Procedure: Laparoscopic removal of Lapband (2009) with  sleeve gastrectomy and upper endoscopy  Diagnosis: Morbid obesity and esophageal dilatation; unable to adjust  Complications: None noted  EBL:   minimal cc  Description of Procedure:  The patient was take to OR 1 and given general anesthesia.  The abdomen was prepped with Technicare and draped sterilely.  A timeout was performed.  Access to the abdomen was achieved with a 5 mm Optiview through the left upper quadrant.  Following insufflation, the state of the abdomen was found to be with some adhesions from her lapband and tubing.  The ViSiGi 36Fr tube was inserted to deflate the stomach and was pulled back into the esophagus.  There was much retained material.  The adhesions to the lapband were taken down and it was unbuckled.  A part of the plication was taken down and the APS band was removed.  There was a striking amount of cicatrix noted and this was removed/divided.  The plication was completely taken down and no erosion was noted  The pylorus was identified and we measured ~5  cm back and marked the antrum.  At that point we began dissection to take down the greater curvature of the stomach using the Harmonic scalpel.  This dissection was taken all the way up to the left crus.  Posterior attachments of the stomach were also taken down.    The ViSiGi tube was then passed into the antrum and suction applied so that it was snug along the lessor curvature.  The "crow's foot" or incisura was identified.  The sleeve gastrectomy was begun using the [employer] stapler beginning with a 4.5 and then all 6 cm black loads with TRS.  Marland Kitchen  When the sleeve was complete the tube was taken off suction and insufflated briefly.  The tube was withdrawn.  Upper endoscopy was then performed by Dr. Redmond Pulling.  The port was removed.  Clips were  applied along the staple line where oozing was prominent.  .     The specimen was extracted through the 15 trocar site.  Local was provided by infiltrating with Exparel tap block and closed 4-0 Monocryl and Dermabond.    Matt B. Hassell Done, [place], [facility], [place]

## 2019-07-01 NOTE — Progress Notes (Signed)
NUTRITION NOTE  Consult received for Bariatric diet education per DROP protocol. Patient is POD #0 gastric sleeve resection, conversation from lap band d/t lap band malfunction. Bariatric Nurse Educator to provide all post-op/pre-discharge education, including diet-related information.  If additional nutrition-related needs arise, please re-consult RD.    Jarome Matin, MS, RD, LDN, CNSC Inpatient Clinical Dietitian RD pager # available in [place]  After hours/weekend pager # available in [facility]

## 2019-07-01 NOTE — Progress Notes (Signed)
Pt did not feel like ambulating in hallway today.  She was walking to the bathroom.  She vomited once at 1800, green with chunks of mucus in it. Have not started water as of yet. Pt not feeling good. Blood pressure still elevated at 183/92.

## 2019-07-01 NOTE — Anesthesia Procedure Notes (Signed)
Procedure Name: Intubation Date/Time: 07/01/2019 7:25 AM Performed by: Victoriano Lain, CRNA Pre-anesthesia Checklist: Patient identified, Emergency Drugs available, Suction available, Patient being monitored and Timeout performed Patient Re-evaluated:Patient Re-evaluated prior to induction Oxygen Delivery Method: Circle system utilized Preoxygenation: Pre-oxygenation with 100% oxygen Induction Type: IV induction, Rapid sequence and Cricoid Pressure applied Laryngoscope Size: Mac and 4 Grade View: Grade I Tube type: Oral Tube size: 7.5 mm Number of attempts: 1 Airway Equipment and Method: Stylet Placement Confirmation: ETT inserted through vocal cords under direct vision,  positive ETCO2 and breath sounds checked- equal and bilateral Secured at: 21 cm Tube secured with: Tape Dental Injury: Teeth and Oropharynx as per pre-operative assessment  Comments: RSI with Cricoid pressure by Dr Royce Macadamia

## 2019-07-01 NOTE — Interval H&P Note (Signed)
History and Physical Interval Note:  07/01/2019 7:13 AM  Stacey Riddle  has presented today for surgery, with the diagnosis of Lap Band Malfunction, Severe Nocturnal GERD, dilated esophagus, HTN, DM II.  The various methods of treatment have been discussed with the patient and family. After consideration of risks, benefits and other options for treatment, the patient has consented to  Procedure(s): LAPAROSCOPIC GASTRIC BAND REMOVAL WITH LAPAROSCOPIC GASTRIC SLEEVE RESECTION, Upper Endo, ERAS Pathway (N/A) as a surgical intervention.  The patient's history has been reviewed, patient examined, no change in status, stable for surgery.  I have reviewed the patient's chart and labs.  Questions were answered to the patient's satisfaction.     Pedro Earls

## 2019-07-01 NOTE — Progress Notes (Signed)
Discussed post op day goals with patient including ambulation, IS, diet progression, pain, and nausea control.  BSTOP education provided including BSTOP information guide, "Guide for Pain Management after your Bariatric Procedure".  Questions answered. 

## 2019-07-02 LAB — BASIC METABOLIC PANEL
Anion gap: 12 (ref 5–15)
BUN: 11 mg/dL (ref 6–20)
CO2: 23 mmol/L (ref 22–32)
Calcium: 8.8 mg/dL — ABNORMAL LOW (ref 8.9–10.3)
Chloride: 98 mmol/L (ref 98–111)
Creatinine, Ser: 0.95 mg/dL (ref 0.44–1.00)
GFR calc Af Amer: >60 mL/min (ref >60)
GFR calc non Af Amer: >60 mL/min (ref >60)
Glucose, Bld: 198 mg/dL — ABNORMAL HIGH (ref 70–99)
Potassium: 4 mmol/L (ref 3.5–5.1)
Sodium: 133 mmol/L — ABNORMAL LOW (ref 135–145)

## 2019-07-02 LAB — CBC WITH DIFFERENTIAL/PLATELET
Abs Immature Granulocytes: 0.15 10*3/uL — ABNORMAL HIGH (ref 0.00–0.07)
Basophils Absolute: 0.1 10*3/uL (ref 0.0–0.1)
Basophils Relative: 0 %
Eosinophils Absolute: 0 10*3/uL (ref 0.0–0.5)
Eosinophils Relative: 0 %
HCT: 37.6 % (ref 36.0–46.0)
Hemoglobin: 12 g/dL (ref 12.0–15.0)
Immature Granulocytes: 1 %
Lymphocytes Relative: 8 %
Lymphs Abs: 2 10*3/uL (ref 0.7–4.0)
MCH: 26.9 pg (ref 26.0–34.0)
MCHC: 31.9 g/dL (ref 30.0–36.0)
MCV: 84.3 fL (ref 80.0–100.0)
Monocytes Absolute: 1.9 10*3/uL — ABNORMAL HIGH (ref 0.1–1.0)
Monocytes Relative: 8 %
Neutro Abs: 21.5 10*3/uL — ABNORMAL HIGH (ref 1.7–7.7)
Neutrophils Relative %: 83 %
Platelets: 235 10*3/uL (ref 150–400)
RBC: 4.46 MIL/uL (ref 3.87–5.11)
RDW: 13.9 % (ref 11.5–15.5)
WBC: 25.7 10*3/uL — ABNORMAL HIGH (ref 4.0–10.5)
nRBC: 0 % (ref 0.0–0.2)

## 2019-07-02 LAB — GLUCOSE, CAPILLARY
Glucose-Capillary: 148 mg/dL — ABNORMAL HIGH (ref 70–99)
Glucose-Capillary: 152 mg/dL — ABNORMAL HIGH (ref 70–99)
Glucose-Capillary: 171 mg/dL — ABNORMAL HIGH (ref 70–99)
Glucose-Capillary: 172 mg/dL — ABNORMAL HIGH (ref 70–99)
Glucose-Capillary: 172 mg/dL — ABNORMAL HIGH (ref 70–99)
Glucose-Capillary: 210 mg/dL — ABNORMAL HIGH (ref 70–99)
Glucose-Capillary: 223 mg/dL — ABNORMAL HIGH (ref 70–99)

## 2019-07-02 LAB — SURGICAL PATHOLOGY

## 2019-07-02 MED ORDER — LOSARTAN POTASSIUM 50 MG PO TABS
100.0000 mg | ORAL_TABLET | Freq: Every day | ORAL | Status: DC
  Administered 2019-07-02 – 2019-07-03 (×2): 100 mg via ORAL
  Filled 2019-07-02 (×2): qty 2

## 2019-07-02 NOTE — Progress Notes (Signed)
Patient alert and oriented, Post op day 1.  Provided support and encouragement.  Encouraged pulmonary toilet, ambulation and small sips of liquids.  Patient started water this am.  All questions answered.  Will continue to monitor.

## 2019-07-02 NOTE — Progress Notes (Signed)
Pt started drinking first 2oz cup of protein at 1800.

## 2019-07-02 NOTE — Progress Notes (Signed)
Charge nurse and Dr. Lucia Gaskins made aware of patient having a yellow MEWs due to heart rate (100s-120s) and elevated BPs (190/94). MEWs sheet has been initiated, new orders obtained: add BMET to morning labs, also to notify Dr. Hassell Done in am. PRN medications will be given, patient is calm and resting in bed. Norlene Duel RN, BSN

## 2019-07-02 NOTE — Progress Notes (Signed)
Patient ID: Stacey Riddle, female   DOB: 30-Mar-1965, 54 y.o.   MRN: [id number] [facility] Progress Note:   1 Day Post-Op  Subjective: Mental status is clear Objective: Vital signs in last 24 hours: Temp:  [97.3 F (36.3 C)-99.5 F (37.5 C)] 98.1 F (36.7 C) (04/07 1227) Pulse Rate:  [72-117] 74 (04/07 1227) Resp:  [16-18] 18 (04/07 1227) BP: (178-220)/(83-113) 178/83 (04/07 1227) SpO2:  [99 %-100 %] 100 % (04/07 1227)  Intake/Output from previous day: 04/06 0701 - 04/07 0700 In: 3637.7 [P.O.:60; I.V.:3477.7; IV Piggyback:100] Out: 1715 [Urine:1600; Emesis/NG output:100; Blood:15] Intake/Output this shift: Total I/O In: 614.9 [P.O.:240; I.V.:374.9] Out: 600 [Urine:600]  Physical Exam: Work of breathing is not labored.  Doing better but still with nausea  Lab Results:  Results for orders placed or performed during the hospital encounter of 07/01/19 (from the past 48 hour(s))  Pregnancy, urine STAT morning of surgery     Status: None   Collection Time: 07/01/19  5:43 AM  Result Value Ref Range   Preg Test, Ur NEGATIVE NEGATIVE    Comment:        THE SENSITIVITY OF THIS METHODOLOGY IS >20 mIU/mL. Performed at [facility], [place] [address], [place], [place] [address]   Glucose, capillary     Status: Abnormal   Collection Time: 07/01/19  5:44 AM  Result Value Ref Range   Glucose-Capillary 131 (H) 70 - 99 mg/dL    Comment: Glucose reference range applies only to samples taken after fasting for at least 8 hours.  Surgical pathology     Status: None   Collection Time: 07/01/19  9:33 AM  Result Value Ref Range   SURGICAL PATHOLOGY      SURGICAL PATHOLOGY CASE: [id number] PATIENT: Stacey Riddle Surgical Pathology Report     Clinical History: Lap band malfunction, severe nocturnal GERD, dilated esophagus, HTN, DM II (cm)     FINAL MICROSCOPIC DIAGNOSIS:  A. STOMACH, SLEEVE RESECTION: -Gross diagnosis only: Portion of  unremarkable stomach   GROSS DESCRIPTION:  The specimen is received fresh and consists of a 26.6 x 4.2 x 2.8 cm piece of tan-pink soft tissue with a disrupted staple line.  The serosal surface is tan-pink to red, focally disrupted, the small amount of attached tan-yellow adipose tissue.  The lumen contains a small amount of tan-brown, partially digested food.  Mucosa ranges from tan-pink to red-purple and dusky, with normal mucosal folding.  Mucosa is focally disrupted, no mucosal lesions are grossly identified.  Wall averages 0.2 cm in thickness.  No sections are submitted, gross exam only. Craig Staggers 07/01/2019)    Final Diagnosis performed by Enid Cutter, MD.   Electron ically signed 07/02/2019 Technical component performed at [facility], [place] [address] [place], [place] [address].  Professional component performed at [employer] [facility], [place] [address], [place], [place] [address].  Immunohistochemistry Technical component (if applicable) was performed at [facility]. [address] [place], [place], [place] [address].   IMMUNOHISTOCHEMISTRY DISCLAIMER (if applicable): Some of these immunohistochemical stains may have been developed and the performance characteristics determine by [facility]. Some may not have been cleared or approved by the U.S. Food and Drug Administration. The FDA has determined that such clearance or approval is not necessary. This test is used for clinical purposes. It should not be regarded as investigational or for research. This laboratory is certified under the [place] (CLIA-88) as qualified to  perform hig h complexity clinical laboratory testing.  The controls stained appropriately.   Glucose, capillary     Status: Abnormal   Collection Time: 07/01/19 11:02 AM  Result Value Ref Range   Glucose-Capillary 227 (H) 70 - 99 mg/dL    Comment: Glucose  reference range applies only to samples taken after fasting for at least 8 hours.  CBC     Status: Abnormal   Collection Time: 07/01/19 11:40 AM  Result Value Ref Range   WBC 20.5 (H) 4.0 - 10.5 K/uL   RBC 4.40 3.87 - 5.11 MIL/uL   Hemoglobin 11.9 (L) 12.0 - 15.0 g/dL   HCT 38.6 36.0 - 46.0 %   MCV 87.7 80.0 - 100.0 fL   MCH 27.0 26.0 - 34.0 pg   MCHC 30.8 30.0 - 36.0 g/dL   RDW 14.1 11.5 - 15.5 %   Platelets 212 150 - 400 K/uL   nRBC 0.0 0.0 - 0.2 %    Comment: Performed at [facility], [place] [address], [place], [place] [address]  Creatinine, serum     Status: Abnormal   Collection Time: 07/01/19 11:40 AM  Result Value Ref Range   Creatinine, Ser 1.12 (H) 0.44 - 1.00 mg/dL   GFR calc non Af Amer 56 (L) >60 mL/min   GFR calc Af Amer >60 >60 mL/min    Comment: Performed at [facility] [place] [address], [place], [place] [address]  Hemoglobin and hematocrit, blood     Status: None   Collection Time: 07/01/19  2:49 PM  Result Value Ref Range   Hemoglobin 12.0 12.0 - 15.0 g/dL   HCT 38.8 36.0 - 46.0 %    Comment: Performed at [facility], [place] [address] [place], [place] [address]  Glucose, capillary     Status: Abnormal   Collection Time: 07/01/19  4:36 PM  Result Value Ref Range   Glucose-Capillary 202 (H) 70 - 99 mg/dL    Comment: Glucose reference range applies only to samples taken after fasting for at least 8 hours.  Glucose, capillary     Status: Abnormal   Collection Time: 07/01/19  9:01 PM  Result Value Ref Range   Glucose-Capillary 207 (H) 70 - 99 mg/dL    Comment: Glucose reference range applies only to samples taken after fasting for at least 8 hours.  Glucose, capillary     Status: Abnormal   Collection Time: 07/02/19 12:27 AM  Result Value Ref Range   Glucose-Capillary 223 (H) 70 - 99 mg/dL    Comment: Glucose reference range applies only to samples taken after fasting for at least 8 hours.  Glucose,  capillary     Status: Abnormal   Collection Time: 07/02/19  3:50 AM  Result Value Ref Range   Glucose-Capillary 210 (H) 70 - 99 mg/dL    Comment: Glucose reference range applies only to samples taken after fasting for at least 8 hours.  CBC WITH DIFFERENTIAL     Status: Abnormal   Collection Time: 07/02/19  4:56 AM  Result Value Ref Range   WBC 25.7 (H) 4.0 - 10.5 K/uL   RBC 4.46 3.87 - 5.11 MIL/uL   Hemoglobin 12.0 12.0 - 15.0 g/dL   HCT 37.6 36.0 - 46.0 %   MCV 84.3 80.0 - 100.0 fL   MCH 26.9 26.0 - 34.0 pg   MCHC 31.9 30.0 - 36.0 g/dL   RDW 13.9 11.5 - 15.5 %   Platelets 235 150 -  400 K/uL   nRBC 0.0 0.0 - 0.2 %   Neutrophils Relative % 83 %   Neutro Abs 21.5 (H) 1.7 - 7.7 K/uL   Lymphocytes Relative 8 %   Lymphs Abs 2.0 0.7 - 4.0 K/uL   Monocytes Relative 8 %   Monocytes Absolute 1.9 (H) 0.1 - 1.0 K/uL   Eosinophils Relative 0 %   Eosinophils Absolute 0.0 0.0 - 0.5 K/uL   Basophils Relative 0 %   Basophils Absolute 0.1 0.0 - 0.1 K/uL   Immature Granulocytes 1 %   Abs Immature Granulocytes 0.15 (H) 0.00 - 0.07 K/uL    Comment: Performed at [facility], [place] [address], [place], [place] [id number]  Basic metabolic panel     Status: Abnormal   Collection Time: 07/02/19  4:56 AM  Result Value Ref Range   Sodium 133 (L) 135 - 145 mmol/L   Potassium 4.0 3.5 - 5.1 mmol/L   Chloride 98 98 - 111 mmol/L   CO2 23 22 - 32 mmol/L   Glucose, Bld 198 (H) 70 - 99 mg/dL    Comment: Glucose reference range applies only to samples taken after fasting for at least 8 hours.   BUN 11 6 - 20 mg/dL   Creatinine, Ser 0.95 0.44 - 1.00 mg/dL   Calcium 8.8 (L) 8.9 - 10.3 mg/dL   GFR calc non Af Amer >60 >60 mL/min   GFR calc Af Amer >60 >60 mL/min   Anion gap 12 5 - 15    Comment: Performed at [facility], [place] [address], [place], [place] [address]  Glucose, capillary     Status: Abnormal   Collection Time: 07/02/19  7:43 AM  Result Value Ref Range    Glucose-Capillary 171 (H) 70 - 99 mg/dL    Comment: Glucose reference range applies only to samples taken after fasting for at least 8 hours.  Glucose, capillary     Status: Abnormal   Collection Time: 07/02/19 11:23 AM  Result Value Ref Range   Glucose-Capillary 148 (H) 70 - 99 mg/dL    Comment: Glucose reference range applies only to samples taken after fasting for at least 8 hours.    Radiology/Results: No results found.  Anti-infectives: Anti-infectives (From admission, onward)   Start     Dose/Rate Route Frequency Ordered Stop   07/01/19 0600  cefoTEtan (CEFOTAN) 2 g in sodium chloride 0.9 % 100 mL IVPB     2 g 200 mL/hr over 30 Minutes Intravenous On call to O.R. 07/01/19 0542 07/01/19 0757      Assessment/Plan: Problem List: Patient Active Problem List   Diagnosis Date Noted  . S/P laparoscopic sleeve gastrectomy after removal of lapband 07/01/2019  . Lapband APS + [facility] repair Jan 2009 03/08/2012  . Chest pain 07/26/2011  . Fitting and adjustment of gastric lap band 06/29/2011  . URI 05/26/2009  . DM 05/14/2009  . ALLERGIC RHINITIS 05/14/2009  . COUGH, CHRONIC 05/14/2009  . POLYCYSTIC OVARIAN DISEASE 05/13/2009  . HYPERTENSION 05/13/2009    Not ready for discharge today.   1 Day Post-Op    LOS: 1 day   Matt B. Hassell Done, MD, [facility] [phone] beeper [phone]  07/02/2019 2:51 PM

## 2019-07-02 NOTE — Progress Notes (Signed)
On call made aware of patient having elevated blood pressures, no new orders. Patient is still calm and resting in bed. No complaints of pain at this time. Will continue to monitor patient for changes. Norlene Duel RN, BSN

## 2019-07-03 LAB — CBC WITH DIFFERENTIAL/PLATELET
Abs Immature Granulocytes: 0.08 10*3/uL — ABNORMAL HIGH (ref 0.00–0.07)
Basophils Absolute: 0.1 10*3/uL (ref 0.0–0.1)
Basophils Relative: 0 %
Eosinophils Absolute: 0.4 10*3/uL (ref 0.0–0.5)
Eosinophils Relative: 2 %
HCT: 36.7 % (ref 36.0–46.0)
Hemoglobin: 11.8 g/dL — ABNORMAL LOW (ref 12.0–15.0)
Immature Granulocytes: 1 %
Lymphocytes Relative: 9 %
Lymphs Abs: 1.6 10*3/uL (ref 0.7–4.0)
MCH: 27.2 pg (ref 26.0–34.0)
MCHC: 32.2 g/dL (ref 30.0–36.0)
MCV: 84.6 fL (ref 80.0–100.0)
Monocytes Absolute: 1.3 10*3/uL — ABNORMAL HIGH (ref 0.1–1.0)
Monocytes Relative: 8 %
Neutro Abs: 14 10*3/uL — ABNORMAL HIGH (ref 1.7–7.7)
Neutrophils Relative %: 80 %
Platelets: 202 10*3/uL (ref 150–400)
RBC: 4.34 MIL/uL (ref 3.87–5.11)
RDW: 14 % (ref 11.5–15.5)
WBC: 17.5 10*3/uL — ABNORMAL HIGH (ref 4.0–10.5)
nRBC: 0 % (ref 0.0–0.2)

## 2019-07-03 LAB — GLUCOSE, CAPILLARY
Glucose-Capillary: 131 mg/dL — ABNORMAL HIGH (ref 70–99)
Glucose-Capillary: 149 mg/dL — ABNORMAL HIGH (ref 70–99)

## 2019-07-03 MED ORDER — OXYCODONE HCL 5 MG PO TABS: 5.0000 mg | ORAL_TABLET | Freq: Four times a day (QID) | ORAL | 0 refills | Status: DC | PRN

## 2019-07-03 MED ORDER — ACETAMINOPHEN 500 MG PO TABS: 1000.0000 mg | ORAL_TABLET | Freq: Three times a day (TID) | ORAL | 0 refills | Status: AC

## 2019-07-03 MED ORDER — ONDANSETRON 4 MG PO TBDP: 4.0000 mg | ORAL_TABLET | Freq: Four times a day (QID) | ORAL | 0 refills | Status: DC | PRN

## 2019-07-03 MED ORDER — PANTOPRAZOLE SODIUM 40 MG PO TBEC: 40.0000 mg | DELAYED_RELEASE_TABLET | Freq: Every day | ORAL | 0 refills | Status: DC

## 2019-07-03 NOTE — Progress Notes (Signed)

## 2019-07-03 NOTE — Discharge Summary (Signed)
Physician Discharge Summary  Patient ID: Stacey Riddle MRN: [id number] DOB/AGE: Aug 29, 1965 54 y.o.  PCP: Vernie Shanks, MD  Admit date: 07/01/2019 Discharge date: 07/03/2019  Admission Diagnoses:  lapband malfunction  Discharge Diagnoses:  same  Principal Problem:   S/P laparoscopic sleeve gastrectomy after removal of lapband   Surgery:  removal of lapband and conversion to sleeve gastrectomy  Discharged Condition: improved  Hospital Course:   had surgery and had some issues with hypertension.  Was ready for discharge on PD @  Consults: none  Significant Diagnostic Studies: none    Discharge Exam: Blood pressure (!) 159/88, pulse 70, temperature 98.3 F (36.8 C), resp. rate 16, height 5' 2.5" (1.588 m), weight 72 kg, SpO2 100 %. Incisions OK  Disposition: Discharge disposition: 01-Home or Self Care       Discharge Instructions     Ambulate hourly while awake   Complete by: As directed    Call MD for:  difficulty breathing, headache or visual disturbances   Complete by: As directed    Call MD for:  persistant dizziness or light-headedness   Complete by: As directed    Call MD for:  persistant nausea and vomiting   Complete by: As directed    Call MD for:  redness, tenderness, or signs of infection (pain, swelling, redness, odor or green/yellow discharge around incision site)   Complete by: As directed    Call MD for:  severe uncontrolled pain   Complete by: As directed    Call MD for:  temperature >101 F   Complete by: As directed    Diet bariatric full liquid   Complete by: As directed    Incentive spirometry   Complete by: As directed    Perform hourly while awake      Allergies as of 07/03/2019       Reactions   Biaxin [clarithromycin] Other (See Comments)   Stomach cramps        Medication List     STOP taking these medications    cephALEXin 500 MG capsule Commonly known as: Keflex   cyclobenzaprine 10 MG tablet Commonly known as:  FLEXERIL   HYDROcodone-acetaminophen 5-325 MG tablet Commonly known as: NORCO/VICODIN   sucralfate 1 GM/10ML suspension Commonly known as: Carafate       TAKE these medications    acetaminophen 500 MG tablet Commonly known as: TYLENOL Take 2 tablets (1,000 mg total) by mouth every 8 (eight) hours for 5 days. What changed:  when to take this reasons to take this   Bydureon 2 MG Pen Generic drug: Exenatide ER Inject 2 mg as directed once a week. Notes to patient: Monitor Blood Sugar Frequently and keep a log for primary care physician, you may need to adjust medication dosage with rapid weight loss.     estradiol 0.05 mg/24hr patch Commonly known as: CLIMARA - Dosed in mg/24 hr Place 0.05 mg onto the skin once a week.   levonorgestrel 20 MCG/24HR IUD Commonly known as: MIRENA 1 each by Intrauterine route once.   losartan 100 MG tablet Commonly known as: COZAAR Take 100 mg by mouth daily. Notes to patient: Monitor Blood Pressure Daily and keep a log for primary care physician.  You may need to make changes to your medications with rapid weight loss.     metFORMIN 500 MG tablet Commonly known as: GLUCOPHAGE Take 500 mg by mouth 2 (two) times daily. Notes to patient: Monitor Blood Sugar Frequently and keep a log for primary  care physician, you may need to adjust medication dosage with rapid weight loss.     multivitamin with minerals Tabs tablet Take 1 tablet by mouth at bedtime.   ondansetron 4 MG disintegrating tablet Commonly known as: ZOFRAN-ODT Take 1 tablet (4 mg total) by mouth every 6 (six) hours as needed for nausea or vomiting.   oxyCODONE 5 MG immediate release tablet Commonly known as: Oxy IR/ROXICODONE Take 1 tablet (5 mg total) by mouth every 6 (six) hours as needed for severe pain.   pantoprazole 40 MG tablet Commonly known as: PROTONIX Take 40 mg by mouth daily. What changed: Another medication with the same name was added. Make sure you understand  how and when to take each.   pantoprazole 40 MG tablet Commonly known as: PROTONIX Take 1 tablet (40 mg total) by mouth daily. What changed: You were already taking a medication with the same name, and this prescription was added. Make sure you understand how and when to take each.   potassium chloride SA 20 MEQ tablet Commonly known as: KLOR-CON Take 20 mEq by mouth 2 (two) times daily.   rosuvastatin 10 MG tablet Commonly known as: CRESTOR Take 10 mg by mouth every Monday, Wednesday, and Friday.       Follow-up Information     Sur[facility], [place]. Go on 07/23/2019.   Specialty: General Surgery Why: Your appointment is with Dr Johnathan Hausen at 915 am.  Please arrive 15 minutes prior to your appointment. Thank you Contact information: [place] [address] [phone]         Carlena Hurl, PA-C. Go on 08/21/2019.   Specialty: General Surgery Why: at 915 am.  Please arrive 15 minutes prior to your appointment.  Thank you Contact information: [place] [address] [phone]            Signed: Pedro Earls 07/03/2019, 8:58 AM

## 2019-07-03 NOTE — Progress Notes (Signed)
Patient alert and oriented, Post op day 2.  Provided support and encouragement.  Encouraged pulmonary toilet, ambulation and small sips of liquids.  Patient tolerated 3 ounces of protein yesterday evening without nausea.  Restarted protein this morning, pt sitting in chair.  No complaints of pain or nausea.  All questions answered.  Will continue to monitor.

## 2019-07-03 NOTE — Progress Notes (Signed)
D/C instructions given by [place] Neptune.  Pt was d/cd home.

## 2019-07-07 ENCOUNTER — Telehealth (HOSPITAL_COMMUNITY): Payer: Self-pay

## 2019-07-07 NOTE — Telephone Encounter (Signed)
Patient called to discuss post bariatric surgery follow up questions.  See below:   1.  Tell me about your pain and pain management?soreness in stomach, headache over the weekend, just taking tylenol  2.  Let's talk about fluid intake.  How much total fluid are you taking in?55 ounces   3.  How much protein have you taken in the last 2 days?15 grams, recommended unflavored protein  4.  Have you had nausea?  Tell me about when have experienced nausea and what you did to help?denies  5.  Has the frequency or color changed with your urine?no problems  6.  Tell me what your incisions look like?no problems  7.  Have you been passing gas? BM?had bm since been home took miralax  8.  If a problem or question were to arise who would you call?  Do you know contact numbers for [place], [facility], and [facility]?aware of how to contact all services  9.  How has the walking going?walking around home  10.  How are your vitamins and calcium going?  How are you taking them?mvi and calcium

## 2019-07-15 ENCOUNTER — Encounter: Payer: 59 | Attending: Surgery | Admitting: Skilled Nursing Facility1

## 2019-07-15 ENCOUNTER — Other Ambulatory Visit: Payer: Self-pay

## 2019-07-15 DIAGNOSIS — E669 Obesity, unspecified: Secondary | ICD-10-CM | POA: Diagnosis present

## 2019-07-15 DIAGNOSIS — E119 Type 2 diabetes mellitus without complications: Secondary | ICD-10-CM | POA: Insufficient documentation

## 2019-07-17 NOTE — Progress Notes (Signed)
2 Week Post-Operative Nutrition Class   Patient was seen on 05/21/18 for Post-Operative Nutrition education at the [facility].    Surgery date: 07/01/2019 Surgery type: band to sleeve Start weight at [facility]: 162.8 Weight today: 155.1   Body Composition Scale Declined  Total Body Fat %   Visceral Fat   Fat-Free Mass %    Total Body Water %    Muscle-Mass lbs   Body Fat Displacement          Torso  lbs          Left Leg  lbs          Right Leg  lbs          Left Arm  lbs          Right Arm   lbs      The following the learning objectives were met by the patient during this course:  Identifies Phase 3 (Soft, High Proteins) Dietary Goals and will begin from 2 weeks post-operatively to 2 months post-operatively  Identifies appropriate sources of fluids and proteins   States protein recommendations and appropriate sources post-operatively  Identifies the need for appropriate texture modifications, mastication, and bite sizes when consuming solids  Identifies appropriate multivitamin and calcium sources post-operatively  Describes the need for physical activity post-operatively and will follow MD recommendations  States when to call healthcare provider regarding medication questions or post-operative complications   Handouts given during class include:  Phase 3A: Soft, High Protein Diet Handout   Follow-Up Plan: Patient will follow-up at [facility] in 6 weeks for 2 month post-op nutrition visit for diet advancement per MD.

## 2019-07-21 ENCOUNTER — Telehealth: Payer: Self-pay | Admitting: Skilled Nursing Facility1

## 2019-07-21 NOTE — Telephone Encounter (Signed)
RD called pt to verify fluid intake once starting soft, solid proteins 2 week post-bariatric surgery.  ° °Daily Fluid intake: 64+ °Daily Protein intake: 60+ ° °Concerns/issues:  ° °None stated °

## 2019-08-26 ENCOUNTER — Ambulatory Visit: Payer: 59 | Admitting: Dietician

## 2019-09-02 ENCOUNTER — Other Ambulatory Visit: Payer: Self-pay

## 2019-09-02 ENCOUNTER — Encounter: Payer: 59 | Attending: Surgery | Admitting: Dietician

## 2019-09-02 ENCOUNTER — Encounter: Payer: Self-pay | Admitting: Dietician

## 2019-09-02 DIAGNOSIS — E669 Obesity, unspecified: Secondary | ICD-10-CM | POA: Diagnosis present

## 2019-09-02 DIAGNOSIS — E119 Type 2 diabetes mellitus without complications: Secondary | ICD-10-CM | POA: Diagnosis not present

## 2019-09-02 DIAGNOSIS — Z9884 Bariatric surgery status: Secondary | ICD-10-CM

## 2019-09-02 NOTE — Progress Notes (Signed)
Bariatric Nutrition Follow-Up Visit Medical Nutrition Therapy   2 Months Post-Operative LapBand to Sleeve Surgery Surgery Date: 07/01/2019  Pt's Expectations of Surgery/ Goals: would like to weigh 140 lbs; stabilize weight; to have no stomach pains currently caused by the LapBand; to be able to eat/tolerate a larger  variety of foods Pt Reported Successes: not drinking calories anymore, no discomfort as had with LapBand    NUTRITION ASSESSMENT  Anthropometrics  Start weight at [facility]: 162.8 lbs (date: 05/27/2019) Today's weight: 156.5 lbs  Body Composition Scale 09/02/2019  Weight  lbs 156.5  BMI 28.4  Total Body Fat  % 34.5     Visceral Fat 9  Fat-Free Mass  % 65.4     Total Body Water  % 47.2     Muscle-Mass  lbs 28.1  Body Fat Displacement ---         Torso  lbs 33.3         Left Leg  lbs 6.6         Right Leg  lbs 6.6         Left Arm  lbs 3.3         Right Arm  lbs 3.3    Lifestyle & Dietary Hx Overall has done well with following the diet phase, may have eaten a few crackers or pieces of fruit here and there. States she is focusing on eating her meals first rather than snacking. Mostly snacks on cheese or Cheez-Its, otherwise will eat meat or deli [place] or [place] sausage. Does not care for yogurt, beans, milk, protein shakes, eggs, etc. States she had shingles about 1 month ago which caused a lot of back pain and has prevented her from being very physically active.    24-Hr Dietary Recall First Meal: [place] sausage  Snack: - Second Meal: deli [place] + cheese + lettuce Snack: cheese  Third Meal: meat Snack: - Beverages: water w/ [place] or Crystal Light  Estimated daily fluid intake: 64 oz Estimated daily protein intake: ~50 g Supplements: hair/skin/nails, bariatric MVI, calcium Current average weekly physical activity: none currently, shingles and back pain recently    Post-Op Goals/ Signs/ Symptoms Using straws: no Drinking while eating: sometimes  Chewing/swallowing  difficulties: no Changes in vision: no Changes to mood/headaches: no Hair loss/changes to skin/nails: no Difficulty focusing/concentrating: no Sweating: no Dizziness/lightheadedness: no Palpitations: no  Carbonated/caffeinated beverages: no N/V/D/C/Gas: constipation Abdominal pain: no Dumping syndrome: no   NUTRITION DIAGNOSIS  Overweight/obesity ([place]-3.3) related to past poor dietary habits and physical inactivity as evidenced by completed bariatric surgery and following dietary guidelines for continued weight loss and healthy nutrition status.   NUTRITION INTERVENTION Nutrition counseling (C-1) and education (E-2) to facilitate bariatric surgery goals, including: . Diet advancement to the next phase (phase 4) now including non-starchy vegetables . The importance of consuming adequate calories as well as certain nutrients daily due to the body's need for essential vitamins, minerals, and fats . The importance of daily physical activity and to reach a goal of at least 150 minutes of moderate to vigorous physical activity weekly (or as directed by their physician) due to benefits such as increased musculature and improved lab values  Handouts Provided Include   Phase 4: Protein + Non-Starchy Vegetables  Learning Style & Readiness for Change Teaching method utilized: Visual & Auditory  Demonstrated degree of understanding via: Teach Back  Barriers to learning/adherence to lifestyle change: None Identified    MONITORING & EVALUATION Dietary intake, weekly physical activity, body  weight, and goals in 4 months.  Next Steps Patient is to follow-up in 4 months for 6 month post-op follow-up.

## 2019-09-02 NOTE — Patient Instructions (Signed)

## 2019-10-03 ENCOUNTER — Other Ambulatory Visit: Payer: Self-pay

## 2019-10-03 ENCOUNTER — Ambulatory Visit (HOSPITAL_COMMUNITY)
Admission: EM | Admit: 2019-10-03 | Discharge: 2019-10-03 | Disposition: A | Discharge status: Home / Self Care | Payer: 59 | Attending: Family Medicine | Admitting: Family Medicine

## 2019-10-03 ENCOUNTER — Encounter (HOSPITAL_COMMUNITY): Payer: Self-pay

## 2019-10-03 DIAGNOSIS — M549 Dorsalgia, unspecified: Secondary | ICD-10-CM

## 2019-10-03 DIAGNOSIS — R109 Unspecified abdominal pain: Secondary | ICD-10-CM | POA: Insufficient documentation

## 2019-10-03 LAB — CBC
HCT: 36.6 % (ref 36.0–46.0)
Hemoglobin: 11.6 g/dL — ABNORMAL LOW (ref 12.0–15.0)
MCH: 27.6 pg (ref 26.0–34.0)
MCHC: 31.7 g/dL (ref 30.0–36.0)
MCV: 87.1 fL (ref 80.0–100.0)
Platelets: 225 10*3/uL (ref 150–400)
RBC: 4.2 MIL/uL (ref 3.87–5.11)
RDW: 14.6 % (ref 11.5–15.5)
WBC: 8.7 10*3/uL (ref 4.0–10.5)
nRBC: 0 % (ref 0.0–0.2)

## 2019-10-03 LAB — COMPREHENSIVE METABOLIC PANEL
ALT: 17 U/L (ref 0–44)
AST: 21 U/L (ref 15–41)
Albumin: 3.9 g/dL (ref 3.5–5.0)
Alkaline Phosphatase: 67 U/L (ref 38–126)
Anion gap: 9 (ref 5–15)
BUN: 14 mg/dL (ref 6–20)
CO2: 25 mmol/L (ref 22–32)
Calcium: 9.6 mg/dL (ref 8.9–10.3)
Chloride: 107 mmol/L (ref 98–111)
Creatinine, Ser: 0.87 mg/dL (ref 0.44–1.00)
GFR calc Af Amer: >60 mL/min (ref >60)
GFR calc non Af Amer: >60 mL/min (ref >60)
Glucose, Bld: 97 mg/dL (ref 70–99)
Potassium: 4.6 mmol/L (ref 3.5–5.1)
Sodium: 141 mmol/L (ref 135–145)
Total Bilirubin: 0.5 mg/dL (ref 0.3–1.2)
Total Protein: 7.6 g/dL (ref 6.5–8.1)

## 2019-10-03 LAB — POCT URINALYSIS DIP (DEVICE)
Bilirubin Urine: NEGATIVE
Glucose, UA: NEGATIVE mg/dL
Hgb urine dipstick: NEGATIVE
Ketones, ur: NEGATIVE mg/dL
Leukocytes,Ua: NEGATIVE
Nitrite: NEGATIVE
Protein, ur: 30 mg/dL — AB
Specific Gravity, Urine: 1.025 (ref 1.005–1.030)
Urobilinogen, UA: 0.2 mg/dL (ref 0.0–1.0)
pH: 6.5 (ref 5.0–8.0)

## 2019-10-03 MED ORDER — KETOROLAC TROMETHAMINE 30 MG/ML IJ SOLN
INTRAMUSCULAR | Status: AC
  Filled 2019-10-03: qty 1

## 2019-10-03 MED ORDER — KETOROLAC TROMETHAMINE 30 MG/ML IJ SOLN
30.0000 mg | Freq: Once | INTRAMUSCULAR | Status: AC
  Administered 2019-10-03: 30 mg via INTRAMUSCULAR

## 2019-10-03 NOTE — Discharge Instructions (Signed)
I am hopeful the medication we give you today will help with pain.  Continue with regular tylenol to help with pain. Drink plenty of water to urinate regularly.  We will call you if there are any concerns with your lab tests.  If significantly worsens over the weekend please go to the ER- increased pain, unable to urinate, fevers. If persistent next week please follow up with urology

## 2019-10-03 NOTE — ED Triage Notes (Signed)
Pt presents with lower back pain and left lower quadrant pain x 1 week.

## 2019-10-03 NOTE — ED Provider Notes (Signed)
Stacey Riddle    CSN: [id number] Arrival date & time: 10/03/19  1031      History   Chief Complaint Chief Complaint  Patient presents with  . Back Pain  . Abdominal Pain    HPI Stacey Riddle is a 54 y.o. female.   Stacey Riddle presents with complaints of left flank pain. Started approximately 1 week ago and has not improved. Constant. No nausea. Worse with position transitions such as sitting to standing. No urinary symptoms or blood to urine. Denies any previous similar. History of kidney stones and surgery to remove stone, but does not feel similar to kidney stone. Had shingles recently as well but does not feel similar to shingles pain. No shortness of breath  Or cough. Took tylenol which has not helped. Gastric sleeve surgery in April of this year. History of cholecystectomy. No nausea or vomiting. She is able to eat and drink normally. Pain in a "c" shape from left mid back around to left mid abdomen.    ROS per HPI, negative if not otherwise mentioned.      Past Medical History:  Diagnosis Date  . GERD (gastroesophageal reflux disease)   . H/O laparoscopic adjustable gastric banding 04/09/2007  . History of cervical dysplasia   . History of hiatal hernia    S/P  REPAIR 04-09-2007  . History of kidney stones   . History of polycystic ovarian syndrome   . Hypertension   . Left ureteral stone   . Neuromuscular disorder (HCC)    neuropathy  . Type 2 diabetes mellitus ([place])   . Wears contact lenses     Patient Active Problem List   Diagnosis Date Noted  . S/P laparoscopic sleeve gastrectomy after removal of lapband 07/01/2019  . Lapband APS + [facility] repair Jan 2009 03/08/2012  . Chest pain 07/26/2011  . Fitting and adjustment of gastric lap band 06/29/2011  . URI 05/26/2009  . DM 05/14/2009  . ALLERGIC RHINITIS 05/14/2009  . COUGH, CHRONIC 05/14/2009  . POLYCYSTIC OVARIAN DISEASE 05/13/2009  . HYPERTENSION 05/13/2009    Past Surgical  History:  Procedure Laterality Date  . CARPAL TUNNEL RELEASE Bilateral right 2000/  left 10-02-2008  . CESAREAN SECTION  03/16/2005  . CHOLECYSTECTOMY N/A 11/23/2016   Procedure: LAPAROSCOPIC CHOLECYSTECTOMY WITH INTRAOPERATIVE CHOLANGIOGRAM;  Surgeon: Johnathan Hausen, MD;  Location: [facility];  Service: General;  Laterality: N/A;  . CYSTOSCOPY WITH RETROGRADE PYELOGRAM, URETEROSCOPY AND STENT PLACEMENT Left 01/29/2017   Procedure: CYSTOSCOPY WITH RETROGRADE PYELOGRAM, URETEROSCOPY, LASER LITHOTRIPSY, STONE BASKETRY,  [place];  Surgeon: Nickie Retort, MD;  Location: [facility];  Service: Urology;  Laterality: Left;  . HOLMIUM LASER APPLICATION Left 25/0/5397   Procedure: HOLMIUM LASER APPLICATION;  Surgeon: Nickie Retort, MD;  Location: [facility];  Service: Urology;  Laterality: Left;  . LAPAROSCOPIC GASTRIC BAND REMOVAL WITH LAPAROSCOPIC GASTRIC SLEEVE RESECTION N/A 07/01/2019   Procedure: LAPAROSCOPIC GASTRIC BAND REMOVAL WITH LAPAROSCOPIC GASTRIC SLEEVE RESECTION, Upper Endo, ERAS Pathway;  Surgeon: Johnathan Hausen, MD;  Location: [facility];  Service: General;  Laterality: N/A;  . LAPAROSCOPIC GASTRIC BANDING WITH HIATAL HERNIA REPAIR  04-09-2007   dr Hassell Done [facility]  . REPLACEMENT LAP BAND PORT  02-14-2010   dr Hassell Done [facility]    OB History   No obstetric history on file.      Home Medications    Prior to Admission medications   Medication Sig Start Date End Date Taking? Authorizing Provider  BYDUREON 2 MG  PEN Inject 2 mg as directed once a week.  05/25/16   [provider]  estradiol (CLIMARA - DOSED IN MG/24 HR) 0.05 mg/24hr patch Place 0.05 mg onto the skin once a week. 03/27/19   [provider]  levonorgestrel (MIRENA) 20 MCG/24HR IUD 1 each by Intrauterine route once.    [provider]  losartan (COZAAR) 100 MG tablet Take 100 mg by mouth daily.     [provider]  metFORMIN (GLUCOPHAGE) 500 MG tablet Take  500 mg by mouth 2 (two) times daily. 05/29/19   [provider]  Multiple Vitamin (MULTIVITAMIN WITH MINERALS) TABS Take 1 tablet by mouth at bedtime.     [provider]  ondansetron (ZOFRAN-ODT) 4 MG disintegrating tablet Take 1 tablet (4 mg total) by mouth every 6 (six) hours as needed for nausea or vomiting. 07/03/19   Johnathan Hausen, MD  oxyCODONE (OXY IR/ROXICODONE) 5 MG immediate release tablet Take 1 tablet (5 mg total) by mouth every 6 (six) hours as needed for severe pain. 07/03/19   Johnathan Hausen, MD  pantoprazole (PROTONIX) 40 MG tablet Take 40 mg by mouth daily. 03/31/19   [provider]  pantoprazole (PROTONIX) 40 MG tablet Take 1 tablet (40 mg total) by mouth daily. 07/03/19   Johnathan Hausen, MD  potassium chloride SA (K-DUR,KLOR-CON) 20 MEQ tablet Take 20 mEq by mouth 2 (two) times daily.     [provider]  rosuvastatin (CRESTOR) 10 MG tablet Take 10 mg by mouth every Monday, Wednesday, and Friday. 01/28/19   [provider]    Family History Family History  Problem Relation Age of Onset  . Cancer Mother   . Hyperlipidemia Mother   . Osteoporosis Mother   . Heart disease Father   . Diabetes Father   . Hypertension Father     Social History Social History   Tobacco Use  . Smoking status: Never Smoker  . Smokeless tobacco: Never Used  Vaping Use  . Vaping Use: Never used  Substance Use Topics  . Alcohol use: No  . Drug use: No     Allergies   Biaxin [clarithromycin]   Review of Systems Review of Systems   Physical Exam Triage Vital Signs ED Triage Vitals  Enc Vitals Group     BP 10/03/19 1132 (!) 167/96     Pulse Rate 10/03/19 1132 74     Resp 10/03/19 1132 19     Temp 10/03/19 1132 98.3 F (36.8 C)     Temp Source 10/03/19 1132 Oral     SpO2 10/03/19 1132 100 %     Weight --      Height --      Head Circumference --      Peak Flow --      Pain Score 10/03/19 1131 10     Pain Loc --      Pain Edu? --        Excl. in [place]? --    No data found.  Updated Vital Signs BP (!) 167/96 (BP Location: Right Arm)   Pulse 74   Temp 98.3 F (36.8 C) (Oral)   Resp 19   SpO2 100%    Physical Exam Constitutional:      General: She is not in acute distress.    Appearance: She is well-developed.  Cardiovascular:     Rate and Rhythm: Normal rate.  Pulmonary:     Effort: Pulmonary effort is normal.  Abdominal:  General: Abdomen is flat.     Palpations: Abdomen is soft.       Comments: Left mid abdomen with very mild tenderness around to left mid back; no rebound or guarding; very mild left cva tenderness   Musculoskeletal:     Thoracic back: No bony tenderness.     Lumbar back: No bony tenderness.     Comments: No visible rash or redness   Skin:    General: Skin is warm and dry.  Neurological:     Mental Status: She is alert and oriented to person, place, and time.      UC Treatments / Results  Labs (all labs ordered are listed, but only abnormal results are displayed) Labs Reviewed  CBC - Abnormal; Notable for the following components:      Result Value   Hemoglobin 11.6 (*)    All other components within normal limits  POCT URINALYSIS DIP (DEVICE) - Abnormal; Notable for the following components:   Protein, ur 30 (*)    All other components within normal limits  COMPREHENSIVE METABOLIC PANEL    EKG   Radiology No results found.  Procedures Procedures (including critical care time)  Medications Ordered in UC Medications  ketorolac (TORADOL) 30 MG/ML injection 30 mg (30 mg Intramuscular Given 10/03/19 1215)    Initial Impression / Assessment and Plan / UC Course  I have reviewed the triage vital signs and the nursing notes.  Pertinent labs & imaging results that were available during my care of the patient were reviewed by me and considered in my medical decision making (see chart for details).     Non toxic. Nephrolithiasis suspicious based on location of pain-  however it has been present for 1 week, no change in quality, and patient does not feel it is similar to previous stones she has had. No blood noted to urine specimen. Musculoskeletal pain also considered. toradol provided in clinic today, encouraged to push fluids, rest over the weekend. Cmp, cbc pending. Follow up with urology if persistent, er precautions provided as well. Patient verbalized understanding and agreeable to plan.    Final Clinical Impressions(s) / UC Diagnoses   Final diagnoses:  Left flank pain     Discharge Instructions     I am hopeful the medication we give you today will help with pain.  Continue with regular tylenol to help with pain. Drink plenty of water to urinate regularly.  We will call you if there are any concerns with your lab tests.  If significantly worsens over the weekend please go to the ER- increased pain, unable to urinate, fevers. If persistent next week please follow up with urology   ED Prescriptions    None     PDMP not reviewed this encounter.   Zigmund Gottron, NP 10/03/19 1313

## 2020-01-06 ENCOUNTER — Other Ambulatory Visit: Payer: Self-pay

## 2020-01-06 ENCOUNTER — Encounter: Payer: 59 | Attending: Surgery | Admitting: Skilled Nursing Facility1

## 2020-01-06 DIAGNOSIS — E669 Obesity, unspecified: Secondary | ICD-10-CM | POA: Diagnosis not present

## 2020-01-08 NOTE — Progress Notes (Signed)
Follow-up visit:  Post-Operative Band to sleeve Surgery  Medical Nutrition Therapy:  Appt start time: 6:00pm end time:  7:00pm  Primary concerns today: Post-operative Bariatric Surgery Nutrition Management 6 Month Post-Op Class  Sx date: 07/01/19   Body Composition Scale 01/06/2020  Weight  lbs 165.6  Total Body Fat  % 36.4     Visceral Fat 10  Fat-Free Mass  % 63.5     Total Body Water  % 46.2     Muscle-Mass  lbs 28.2  BMI 30.1  Body Fat Displacement ---        Torso  lbs 37.2        Left Leg  lbs 7.4        Right Leg  lbs 7.4        Left Arm  lbs 3.7        Right Arm  lbs 3.7    Information Reviewed/ Discussed During Appointment: -Review of composition scale numbers -Fluid requirements (64-100 ounces) -Protein requirements (60-80g) -Strategies for tolerating diet -Advancement of diet to include Starchy vegetables -Barriers to inclusion of new foods -Inclusion of appropriate multivitamin and calcium supplements  -Exercise recommendations   Fluid intake: adequate   Medications: See List Supplementation: appropriate   CBG monitoring: 2 times a week Average CBG per patient: 128-138 Last patient reported A1c: 7.4  Using straws: no Drinking while eating: no Having you been chewing well: yes Chewing/swallowing difficulties: no Changes in vision: no Changes to mood/headaches: no Hair loss/Cahnges to skin/Changes to nails: no Any difficulty focusing or concentrating: no Sweating: no Dizziness/Lightheaded: no Palpitations: no  Carbonated beverages: no N/V/D/C/GAS: no Abdominal Pain: no Dumping syndrome: no  Recent physical activity:  ADL's  Progress Towards Goal(s):  In Progress  Handouts given during visit include:  Phase V diet Progression   Goals Sheet  The Benefits of Exercise are endless.....  Support Group Topics  Teaching Method Utilized:  Visual Auditory Hands on  Demonstrated degree of understanding via:  Teach Back    Monitoring/Evaluation:  Dietary intake, exercise, and body weight. Follow up in 3 months for 9 month post-op visit.

## 2020-04-06 ENCOUNTER — Ambulatory Visit: Payer: 59 | Admitting: Dietician

## 2020-04-15 ENCOUNTER — Ambulatory Visit: Payer: 59 | Admitting: Skilled Nursing Facility1

## 2020-06-24 ENCOUNTER — Encounter: Payer: Self-pay | Admitting: *Deleted

## 2020-06-25 ENCOUNTER — Ambulatory Visit: Payer: 59 | Admitting: Neurology

## 2020-06-25 ENCOUNTER — Other Ambulatory Visit: Payer: Self-pay

## 2020-06-25 ENCOUNTER — Encounter: Payer: Self-pay | Admitting: Neurology

## 2020-06-25 VITALS — BP 138/82 | HR 78 | Ht 62.0 in | Wt 171.4 lb

## 2020-06-25 DIAGNOSIS — R200 Anesthesia of skin: Secondary | ICD-10-CM

## 2020-06-25 DIAGNOSIS — R202 Paresthesia of skin: Secondary | ICD-10-CM

## 2020-06-25 DIAGNOSIS — R29898 Other symptoms and signs involving the musculoskeletal system: Secondary | ICD-10-CM

## 2020-06-25 DIAGNOSIS — M4807 Spinal stenosis, lumbosacral region: Secondary | ICD-10-CM | POA: Diagnosis not present

## 2020-06-25 DIAGNOSIS — M5416 Radiculopathy, lumbar region: Secondary | ICD-10-CM

## 2020-06-25 NOTE — Progress Notes (Addendum)
[facility]    Provider:  Dr Jaynee Eagles Requesting Provider: Vernie Shanks, MD Primary Care Provider:  Vernie Shanks, MD  CC:  Evaluation for emg/ncs  HPI:  Stacey Riddle is a 55 y.o. female here as requested by Vernie Shanks, MD for persistent numbness of the feet and evaluation for EMG nerve conduction study test.  I reviewed Dr. Hayden Pedro notes, she has a past medical history of type 2 diabetes, hypertension, hyperlipidemia, LAP-BAND surgery 2009.  She also has lumbar radicular pain, neuralgia, other obesity and numbness of the feet.  She is continued on gabapentin for pain, CMP, CBC and ferritin were ordered I not see those results in requesting provider notes.   She had surgery in April of 2021 she had a lap band, she had the sleeve, it is not working. That is when the back pain started. It wouldn't go away, it ended up being shingles and she was treated. Her back still hurts. She had shingles again and she was treated. She had the shingles injection. She then had shingles again but now she had the second shot. Bilateral low back radiating to the sides, and also down the back of the legs. She has had CTS in the hands, last surgery was 14 years ago, she had release on both of the hands, she has numbness and tingling int he hands, pain, she has nocturnal wakenings, she has no neck pain, no radicular symptoms, the burning in her feet are on top of the feet and in the toes.    Reviewed notes, labs and imaging from outside physicians, which showed: see above   CT L spine 12/05/2018: Segmentation: Five lumbar type non-rib-bearing vertebral bodies. Congenital nonunion the L1 transverse processes.  Alignment: Preservation of the normal lumbar lordosis.No traumatic listhesis.  Vertebrae: No vertebral body height loss or fracture is identified. Mild bilateral sclerotic changes of the SI joints with vacuum phenomenon, similar to prior  Paraspinal and other soft  tissues: Paraspinal soft tissues are free of acute abnormality.  Disc levels: Minimal intervertebral disc height loss most pronounced at L5-1 where there is a global disc bulge. Facet hypertrophic changes are maximal L4-L5 and L5-S1 without significant foraminal or canal stenosis.  IMPRESSION: No acute osseous abnormality of the lumbar spine.  Facet degenerative changes L4-S1.  Mild bilateral SI arthrosis.  For findings within the abdomen and pelvis, please see dedicated abdominopelvic CT from which this study was reconstructed.  Review of Systems: Patient complains of symptoms per HPI as well as the following symptoms: paresthesias.  Pertinent negatives and positives per HPI. All others negative.   Social History   Socioeconomic History  . Marital status: Married    Spouse name: Not on file  . Number of children: 1  . Years of education: one year college  . Highest education level: Not on file  Occupational History  . 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 Smoking status: Never Smoker  . Smokeless tobacco: Never Used  Vaping Use  . Vaping Use: Never used  Substance and Sexual Activity  . Alcohol use: Yes    Comment: rare  . Drug use: Never  . Sexual activity: Not Currently    Birth control/protection: I.U.D.    Comment: Mirena  Other Topics Concern  . Not on file  Social History Narrative   Lives with her husband and daughter.   Caffeine use: one can of Diet Coke per day.   Right-handed.   Social Determinants  of Health   Financial Resource Strain: Not on file  Food Insecurity: Not on file  Transportation Needs: Not on file  Physical Activity: Not on file  Stress: Not on file  Social Connections: Not on file  Intimate Partner Violence: Not on file    Family History  Problem Relation Age of Onset  . Hyperlipidemia Mother   . Osteoporosis Mother   . Hypertension Mother   . Breast cancer Mother   . Heart disease Father   .  Diabetes Father   . Hypertension Father   . Kidney disease Sister   . Diabetes Maternal Grandmother     Past Medical History:  Diagnosis Date  . GERD (gastroesophageal reflux disease)   . H/O laparoscopic adjustable gastric banding 04/09/2007  . History of cervical dysplasia   . History of hiatal hernia    S/P  REPAIR 04-09-2007  . History of kidney stones   . History of polycystic ovarian syndrome   . Hypertension   . Left ureteral stone   . Low back pain   . Neuropathy   . Type 2 diabetes mellitus ([place])   . Wears contact lenses     Patient Active Problem List   Diagnosis Date Noted  . S/P laparoscopic sleeve gastrectomy after removal of lapband 07/01/2019  . Lapband APS + [facility] repair Jan 2009 03/08/2012  . Chest pain 07/26/2011  . Fitting and adjustment of gastric lap band 06/29/2011  . URI 05/26/2009  . DM 05/14/2009  . ALLERGIC RHINITIS 05/14/2009  . COUGH, CHRONIC 05/14/2009  . POLYCYSTIC OVARIAN DISEASE 05/13/2009  . HYPERTENSION 05/13/2009    Past Surgical History:  Procedure Laterality Date  . CARPAL TUNNEL RELEASE Bilateral right 2000/  left 10-02-2008  . CESAREAN SECTION  03/16/2005  . CHOLECYSTECTOMY N/A 11/23/2016   Procedure: LAPAROSCOPIC CHOLECYSTECTOMY WITH INTRAOPERATIVE CHOLANGIOGRAM;  Surgeon: Johnathan Hausen, MD;  Location: [facility];  Service: General;  Laterality: N/A;  . CYSTOSCOPY WITH RETROGRADE PYELOGRAM, URETEROSCOPY AND STENT PLACEMENT Left 01/29/2017   Procedure: CYSTOSCOPY WITH RETROGRADE PYELOGRAM, URETEROSCOPY, LASER LITHOTRIPSY, STONE BASKETRY,  [place];  Surgeon: Nickie Retort, MD;  Location: [facility];  Service: Urology;  Laterality: Left;  . HOLMIUM LASER APPLICATION Left 61/06/4313   Procedure: HOLMIUM LASER APPLICATION;  Surgeon: Nickie Retort, MD;  Location: [facility];  Service: Urology;  Laterality: Left;  . LAPAROSCOPIC GASTRIC BAND REMOVAL WITH LAPAROSCOPIC GASTRIC SLEEVE RESECTION  N/A 07/01/2019   Procedure: LAPAROSCOPIC GASTRIC BAND REMOVAL WITH LAPAROSCOPIC GASTRIC SLEEVE RESECTION, Upper Endo, ERAS Pathway;  Surgeon: Johnathan Hausen, MD;  Location: [facility];  Service: General;  Laterality: N/A;  . LAPAROSCOPIC GASTRIC BANDING WITH HIATAL HERNIA REPAIR  04-09-2007   dr Hassell Done [facility]  . REPLACEMENT LAP BAND PORT  02-14-2010   dr Hassell Done [facility]    Current Outpatient Medications  Medication Sig Dispense Refill  . amLODipine (NORVASC) 2.5 MG tablet Take 2.5 mg by mouth daily.    . Biotin w/ Vitamins C & E (HAIR/SKIN/NAILS PO) Take by mouth.    . Calcium Carbonate (CALCIUM 500 PO) Take 1 tablet by mouth daily.    Marland Kitchen estradiol (ESTRACE) 1 MG tablet Take 1 mg by mouth once a week.    . gabapentin (NEURONTIN) 300 MG capsule Take 300 mg by mouth 3 (three) times daily.    Marland Kitchen levonorgestrel (MIRENA) 20 MCG/24HR IUD 1 each by Intrauterine route once.    Marland Kitchen losartan (COZAAR) 100 MG tablet Take 100 mg by mouth daily.     Marland Kitchen  metFORMIN (GLUCOPHAGE) 500 MG tablet Take 500 mg by mouth 2 (two) times daily.    . Multiple Vitamin (MULTIVITAMIN WITH MINERALS) TABS Take 1 tablet by mouth at bedtime.     . pantoprazole (PROTONIX) 40 MG tablet Take 40 mg by mouth daily.    . phentermine 37.5 MG capsule Take 37.5 mg by mouth every morning.    . potassium chloride SA (K-DUR,KLOR-CON) 20 MEQ tablet Take 20 mEq by mouth 2 (two) times daily.     . rosuvastatin (CRESTOR) 10 MG tablet Take 10 mg by mouth every Monday, Wednesday, and Friday.    . Semaglutide,0.25 or 0.5MG/DOS, (OZEMPIC, 0.25 OR 0.5 MG/DOSE,) 2 MG/1.5ML SOPN Inject into the skin once a week.     No current facility-administered medications for this visit.    Allergies as of 06/25/2020 - Review Complete 06/25/2020  Allergen Reaction Noted  . Biaxin [clarithromycin] Other (See Comments)     Vitals: BP 138/82   Pulse 78   Ht 5' 2"  (1.575 m)   Wt 171 lb 6.4 oz (77.7 kg)   BMI 31.35 kg/m  Last Weight:  Wt Readings from Last 1  Encounters:  06/25/20 171 lb 6.4 oz (77.7 kg)   Last Height:   Ht Readings from Last 1 Encounters:  06/25/20 5' 2"  (1.575 m)     Physical exam: Exam: Gen: NAD, conversant, well nourised, obese, well groomed                     CV: RRR, no MRG. No Carotid Bruits. No peripheral edema, warm, nontender Eyes: Conjunctivae clear without exudates or hemorrhage  Neuro: Detailed Neurologic Exam  Speech:    Speech is normal; fluent and spontaneous with normal comprehension.  Cognition:    The patient is oriented to person, place, and time;     recent and remote memory intact;     language fluent;     normal attention, concentration,     fund of knowledge Cranial Nerves:    The pupils are equal, round, and reactive to light. The fundi are flat. Visual fields are full to finger confrontation. Extraocular movements are intact. Trigeminal sensation is intact and the muscles of mastication are normal. The face is symmetric. The palate elevates in the midline. Hearing intact. Voice is normal. Shoulder shrug is normal. The tongue has normal motion without fasciculations.   Coordination:    No dysmetria or ataxia   Gait:    Normal native gait, can heel-toe and tandem easily  Motor Observation:    No asymmetry, no atrophy, and no involuntary movements noted. Tone:    Normal muscle tone.    Posture:    Posture is normal. normal erect    Strength: right leg flexion 4/5 otherwise strength is V/V in the upper and lower limbs.      Sensation: slight decrease in median nerve distribuion and distally in a gradient fashion in the lowers to pp and temp, vibration intact     Reflex Exam:  DTR's: absent AJs otherwise deep tendon reflexes in the upper and lower extremities are normal bilaterally.   Toes:    The toes are downgoing bilaterally.   Clonus:    Clonus is absent.    Assessment/Plan:  Numbness and tingling in the fingers and toes. Symmetrical.   MRI lumbar spine EMG/NCS right leg  and right hand ( if +CTS then also complete the left) Blood work Sent to [facility] and was evaluated for findings on  MRI lumbar spine, is going to have injections  (Addendum 08/09/2020: Reviewed results of EMG nerve conduction study(normal) and blood work with patient.  Including vitamin D 30.1, sed rate normal, TSH normal, CMP normal, CBC unremarkable slightly elevated white blood cells at 12.6 which she has had in the past, B1 normal, multiple myeloma panel normal, B6 normal, MMA normal, B12 greater than 2000 and folate normal. 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 Will check a few more labs for causes. In the absence of any other findings, this is likely due to neuropathy. Adding ana, sjogren's, RF, pan-anca, heavy metals)   Orders Placed This Encounter  Procedures  . MR LUMBAR SPINE WO CONTRAST  . B12 and Folate Panel  . Methylmalonic acid, serum  . Vitamin B6  . Multiple Myeloma Panel (SPEP&IFE w/QIG)  . Vitamin B1  . CBC with Differential/Platelets  . Comprehensive metabolic panel  . TSH  . Sedimentation rate  . Vitamin D, 25-hydroxy  . NCV with EMG(electromyography)   No orders of the defined types were placed in this encounter.   Cc: Vernie Shanks, MD,  Vernie Shanks, MD  Sarina Ill, MD  [facility] [address] [place], [place] [address]  Phone [phone] Fax [phone]

## 2020-06-25 NOTE — Patient Instructions (Addendum)
EMG/NCS MRI Lumbar spine Blood work   Leory Plowman and Daroff's neurology in clinical practice (8th ed., pp. 1245- 1929). Elsevier."> Goldman-Cecil medicine (26th ed., pp. 2489- 2501). Elsevier.">  Peripheral Neuropathy Peripheral neuropathy is a type of nerve damage. It affects nerves that carry signals between the spinal cord and the arms, legs, and the rest of the body (peripheral nerves). It does not affect nerves in the spinal cord or brain. In peripheral neuropathy, one nerve or a group of nerves may be damaged. Peripheral neuropathy is a broad category that includes many specific nerve disorders, like diabetic neuropathy, hereditary neuropathy, and carpal tunnel syndrome. What are the causes? This condition may be caused by:  Diabetes. This is the most common cause of peripheral neuropathy.  Nerve injury.  Pressure or stress on a nerve that lasts a long time(like from a pinched nerve in the low back)  Lack (deficiency) of B vitamins. This can result from alcoholism, poor diet, or a restricted diet.  Infections.  Autoimmune diseases, such as rheumatoid arthritis and systemic lupus erythematosus.  Nerve diseases that are passed from parent to child (inherited).  Some medicines, such as cancer medicines (chemotherapy).  Poisonous (toxic) substances, such as lead and mercury.  Too little blood flowing to the legs.  Kidney disease.  Thyroid disease. In some cases, the cause of this condition is not known. What are the signs or symptoms? Symptoms of this condition depend on which of your nerves is damaged. Common symptoms include:  Loss of feeling (numbness) in the feet, hands, or both.  Tingling in the feet, hands, or both.  Burning pain.  Very sensitive skin.  Weakness.  Not being able to move a part of the body (paralysis).  Muscle twitching.  Clumsiness or poor coordination.  Loss of balance.  Not being able to control your bladder.  Feeling dizzy.  Sexual  problems. How is this diagnosed? Diagnosing and finding the cause of peripheral neuropathy can be difficult. Your health care provider will take your medical history and do a physical exam. A neurological exam will also be done. This involves checking things that are affected by your brain, spinal cord, and nerves (nervous system). For example, your health care provider will check your reflexes, how you move, and what you can feel. You may have other tests, such as:  Blood tests.  Electromyogram (EMG) and nerve conduction tests. These tests check nerve function and how well the nerves are controlling the muscles.  Imaging tests, such as CT scans or MRI to rule out other causes of your symptoms.  Removing a small piece of nerve to be examined in a lab (nerve biopsy).  Removing and examining a small amount of the fluid that surrounds the brain and spinal cord (lumbar puncture). How is this treated? Treatment for this condition may involve:  Treating the underlying cause of the neuropathy, such as diabetes, kidney disease, or vitamin deficiencies.  Stopping medicines that can cause neuropathy, such as chemotherapy.  Medicine to help relieve pain. Medicines may include: ? Prescription or over-the-counter pain medicine. ? Antiseizure medicine. ? Antidepressants. ? Pain-relieving patches that are applied to painful areas of skin.  Surgery to relieve pressure on a nerve or to destroy a nerve that is causing pain.  Physical therapy to help improve movement and balance.  Devices to help you move around (assistive devices). Follow these instructions at home: Medicines  Take over-the-counter and prescription medicines only as told by your health care provider. Do not take any  other medicines without first asking your health care provider.  Do not drive or use heavy machinery while taking prescription pain medicine. Lifestyle  Do not use any products that contain nicotine or tobacco, such  as cigarettes and e-cigarettes. Smoking keeps blood from reaching damaged nerves. If you need help quitting, ask your health care provider.  Avoid or limit alcohol. Too much alcohol can cause a vitamin B deficiency, and vitamin B is needed for healthy nerves.  Eat a healthy diet. This includes: ? Eating foods that are high in fiber, such as fresh fruits and vegetables, whole grains, and beans. ? Limiting foods that are high in fat and processed sugars, such as fried or sweet foods.   General instructions  If you have diabetes, work closely with your health care provider to keep your blood sugar under control.  If you have numbness in your feet: ? Check every day for signs of injury or infection. Watch for redness, warmth, and swelling. ? Wear padded socks and comfortable shoes. These help protect your feet.  Develop a good support system. Living with peripheral neuropathy can be stressful. Consider talking with a mental health specialist or joining a support group.  Use assistive devices and attend physical therapy as told by your health care provider. This may include using a walker or a cane.  Keep all follow-up visits as told by your health care provider. This is important.   Contact a health care provider if:  You have new signs or symptoms of peripheral neuropathy.  You are struggling emotionally from dealing with peripheral neuropathy.  Your pain is not well-controlled. Get help right away if:  You have an injury or infection that is not healing normally.  You develop new weakness in an arm or leg.  You have fallen or do so frequently. Summary  Peripheral neuropathy is when the nerves in the arms, or legs are damaged, resulting in numbness, weakness, or pain.  There are many causes of peripheral neuropathy, including diabetes, pinched nerves, vitamin deficiencies, autoimmune disease, and hereditary conditions.  Diagnosing and finding the cause of peripheral neuropathy  can be difficult. Your health care provider will take your medical history, do a physical exam, and do tests, including blood tests and nerve function tests.  Treatment involves treating the underlying cause of the neuropathy and taking medicines to help control pain. Physical therapy and assistive devices may also help. This information is not intended to replace advice given to you by your health care provider. Make sure you discuss any questions you have with your health care provider. Document Revised: 12/23/2019 Document Reviewed: 12/23/2019 Elsevier Patient Education  2021 [place] is a test to check how well your muscles and nerves are working. This procedure includes the combined use of electromyogram (EMG) and nerve conduction study (NCS). EMG is used to look for muscular disorders. NCS, which is also called electroneurogram, measures how well your nerves are controlling your muscles. The procedures are usually done together to check if your muscles and nerves are healthy. If the results of the tests are abnormal, this may indicate disease or injury, such as a neuromuscular disease or peripheral nerve damage. Tell a health care provider about:  Any allergies you have.  All medicines you are taking, including vitamins, herbs, eye drops, creams, and over-the-counter medicines.  Any problems you or family members have had with anesthetic medicines.  Any blood disorders you have.  Any surgeries you have had.  Any medical  conditions you have.  If you have a pacemaker.  Whether you are pregnant or may be pregnant. What are the risks? Generally, this is a safe procedure. However, problems may occur, including:  Infection where the electrodes were inserted.  Bleeding. What happens before the procedure? Medicines Ask your health care provider about:  Changing or stopping your regular medicines. This is especially important if you are  taking diabetes medicines or blood thinners.  Taking medicines such as aspirin and ibuprofen. These medicines can thin your blood. Do not take these medicines unless your health care provider tells you to take them.  Taking over-the-counter medicines, vitamins, herbs, and supplements. General instructions  Your health care provider may ask you to avoid: ? Beverages that have caffeine, such as coffee and tea. ? Any products that contain nicotine or tobacco. These products include cigarettes, e-cigarettes, and chewing tobacco. If you need help quitting, ask your health care provider.  Do not use lotions or creams on the same day that you will be having the procedure. What happens during the procedure? For EMG  Your health care provider will ask you to stay in a position so that he or she can access the muscle that will be studied. You may be standing, sitting, or lying down.  You may be given a medicine that numbs the area (local anesthetic).  A very thin needle that has an electrode will be inserted into your muscle.  Another small electrode will be placed on your skin near the muscle.  Your health care provider will ask you to continue to remain still.  The electrodes will send a signal that tells about the electrical activity of your muscles. You may see this on a monitor or hear it in the room.  After your muscles have been studied at rest, your health care provider will ask you to contract or flex your muscles. The electrodes will send a signal that tells about the electrical activity of your muscles.  Your health care provider will remove the electrodes and the electrode needles when the procedure is finished. The procedure may vary among health care providers and hospitals.   For NCS  An electrode that records your nerve activity (recording electrode) will be placed on your skin by the muscle that is being studied.  An electrode that is used as a reference (reference  electrode) will be placed near the recording electrode.  A paste or gel will be applied to your skin between the recording electrode and the reference electrode.  Your nerve will be stimulated with a mild shock. Your health care provider will measure how much time it takes for your muscle to react.  Your health care provider will remove the electrodes and the gel when the procedure is finished. The procedure may vary among health care providers and hospitals.   What happens after the procedure?  It is up to you to get the results of your procedure. Ask your health care provider, or the department that is doing the procedure, when your results will be ready.  Your health care provider may: ? Give you medicines for any pain. ? Monitor the insertion sites to make sure that bleeding stops. Summary  Electromyoneurogram is a test to check how well your muscles and nerves are working.  If the results of the tests are abnormal, this may indicate disease or injury.  This is a safe procedure. However, problems may occur, such as bleeding and infection.  Your health care provider will  do two tests to complete this procedure. One checks your muscles (EMG) and another checks your nerves (NCS).  It is up to you to get the results of your procedure. Ask your health care provider, or the department that is doing the procedure, when your results will be ready. This information is not intended to replace advice given to you by your health care provider. Make sure you discuss any questions you have with your health care provider. Document Revised: 11/27/2017 Document Reviewed: 11/09/2017 Elsevier Patient Education  [place].

## 2020-06-28 ENCOUNTER — Encounter: Payer: Self-pay | Admitting: Neurology

## 2020-06-29 ENCOUNTER — Telehealth: Payer: Self-pay | Admitting: Neurology

## 2020-06-29 NOTE — Telephone Encounter (Addendum)
30 mins MRI lumbar spine wo contrast [employer] # [phone] (exp. 06/29/20-08/13/20) Dr. Jaynee Eagles no to covid questions JM  Scheduled at [facility] 06/30/20

## 2020-06-30 ENCOUNTER — Ambulatory Visit: Payer: 59

## 2020-06-30 DIAGNOSIS — M5416 Radiculopathy, lumbar region: Secondary | ICD-10-CM

## 2020-06-30 DIAGNOSIS — R29898 Other symptoms and signs involving the musculoskeletal system: Secondary | ICD-10-CM

## 2020-06-30 DIAGNOSIS — R200 Anesthesia of skin: Secondary | ICD-10-CM | POA: Diagnosis not present

## 2020-06-30 DIAGNOSIS — R202 Paresthesia of skin: Secondary | ICD-10-CM

## 2020-06-30 DIAGNOSIS — M4807 Spinal stenosis, lumbosacral region: Secondary | ICD-10-CM

## 2020-07-06 LAB — COMPREHENSIVE METABOLIC PANEL
ALT: 10 IU/L (ref 0–32)
AST: 18 IU/L (ref 0–40)
Albumin/Globulin Ratio: 1.6 (ref 1.2–2.2)
Albumin: 4.1 g/dL (ref 3.8–4.9)
Alkaline Phosphatase: 101 IU/L (ref 44–121)
BUN/Creatinine Ratio: 16 (ref 9–23)
BUN: 12 mg/dL (ref 6–24)
Bilirubin Total: <0.2 mg/dL (ref 0.0–1.2)
CO2: 22 mmol/L (ref 20–29)
Calcium: 9.3 mg/dL (ref 8.7–10.2)
Chloride: 104 mmol/L (ref 96–106)
Creatinine, Ser: 0.75 mg/dL (ref 0.57–1.00)
Globulin, Total: 2.5 g/dL (ref 1.5–4.5)
Glucose: 95 mg/dL (ref 65–99)
Potassium: 4.2 mmol/L (ref 3.5–5.2)
Sodium: 140 mmol/L (ref 134–144)
Total Protein: 6.6 g/dL (ref 6.0–8.5)
eGFR: 94 mL/min/{1.73_m2} (ref >59)

## 2020-07-06 LAB — CBC WITH DIFFERENTIAL/PLATELET
Basophils Absolute: 0.1 10*3/uL (ref 0.0–0.2)
Basos: 1 %
EOS (ABSOLUTE): 0.1 10*3/uL (ref 0.0–0.4)
Eos: 1 %
Hematocrit: 33.3 % — ABNORMAL LOW (ref 34.0–46.6)
Hemoglobin: 11.5 g/dL (ref 11.1–15.9)
Immature Grans (Abs): 0 10*3/uL (ref 0.0–0.1)
Immature Granulocytes: 0 %
Lymphocytes Absolute: 3.2 10*3/uL — ABNORMAL HIGH (ref 0.7–3.1)
Lymphs: 25 %
MCH: 28 pg (ref 26.6–33.0)
MCHC: 34.5 g/dL (ref 31.5–35.7)
MCV: 81 fL (ref 79–97)
Monocytes Absolute: 0.8 10*3/uL (ref 0.1–0.9)
Monocytes: 6 %
Neutrophils Absolute: 8.5 10*3/uL — ABNORMAL HIGH (ref 1.4–7.0)
Neutrophils: 67 %
Platelets: 271 10*3/uL (ref 150–450)
RBC: 4.11 x10E6/uL (ref 3.77–5.28)
RDW: 12.3 % (ref 11.7–15.4)
WBC: 12.6 10*3/uL — ABNORMAL HIGH (ref 3.4–10.8)

## 2020-07-06 LAB — VITAMIN B1: Thiamine: 133.7 nmol/L (ref 66.5–200.0)

## 2020-07-06 LAB — MULTIPLE MYELOMA PANEL, SERUM
Albumin SerPl Elph-Mcnc: 3.2 g/dL (ref 2.9–4.4)
Albumin/Glob SerPl: 1 (ref 0.7–1.7)
Alpha 1: 0.2 g/dL (ref 0.0–0.4)
Alpha2 Glob SerPl Elph-Mcnc: 0.8 g/dL (ref 0.4–1.0)
B-Globulin SerPl Elph-Mcnc: 1.2 g/dL (ref 0.7–1.3)
Gamma Glob SerPl Elph-Mcnc: 1.1 g/dL (ref 0.4–1.8)
Globulin, Total: 3.4 g/dL (ref 2.2–3.9)
IgA/Immunoglobulin A, Serum: 294 mg/dL (ref 87–352)
IgG (Immunoglobin G), Serum: 1185 mg/dL (ref 586–1602)
IgM (Immunoglobulin M), Srm: 96 mg/dL (ref 26–217)

## 2020-07-06 LAB — TSH: TSH: 1.57 u[IU]/mL (ref 0.450–4.500)

## 2020-07-06 LAB — B12 AND FOLATE PANEL
Folate: >20 ng/mL (ref >3.0)
Vitamin B-12: >2000 pg/mL — ABNORMAL HIGH (ref 232–1245)

## 2020-07-06 LAB — METHYLMALONIC ACID, SERUM: Methylmalonic Acid: 85 nmol/L (ref 0–378)

## 2020-07-06 LAB — VITAMIN D 25 HYDROXY (VIT D DEFICIENCY, FRACTURES): Vit D, 25-Hydroxy: 30.1 ng/mL (ref 30.0–100.0)

## 2020-07-06 LAB — VITAMIN B6: Vitamin B6: 16.4 ug/L (ref 2.0–32.8)

## 2020-07-06 LAB — SEDIMENTATION RATE: Sed Rate: 35 mm/hr (ref 0–40)

## 2020-07-08 ENCOUNTER — Telehealth: Payer: Self-pay | Admitting: *Deleted

## 2020-07-08 DIAGNOSIS — M5416 Radiculopathy, lumbar region: Secondary | ICD-10-CM

## 2020-07-08 NOTE — Telephone Encounter (Signed)
Spoke with the patient and discussed MRI lumbar spine results.  Patient is aware she has arthritis and at one level has the potential for some nerve pinching.  She is amenable to referral to [facility] for evaluation for injections.  She will also follow-up at the EMG/NCS appointment on May 2 at 2:45 PM.  Her questions were answered and she verbalized appreciation.

## 2020-07-08 NOTE — Telephone Encounter (Signed)
-----   Message from Melvenia Beam, MD sent at 07/06/2020  2:45 PM EDT ----- You have some arthritis in your low back and at one level the potential for some nerve pinching. We can send you for evaluation of injections to see if that would help? Could send to [facility] or Dr. Nelva Bush at [facility]? Depends if you have any preference, or you can speak to Dr. Jacelyn Grip about it. I will ask Romelle Starcher to give you a call and We will discuss when I see you for emg/ncs thanks

## 2020-07-13 ENCOUNTER — Ambulatory Visit: Payer: 59

## 2020-07-26 ENCOUNTER — Encounter: Payer: 59 | Admitting: Neurology

## 2020-08-09 ENCOUNTER — Ambulatory Visit (INDEPENDENT_AMBULATORY_CARE_PROVIDER_SITE_OTHER): Payer: 59 | Admitting: Neurology

## 2020-08-09 ENCOUNTER — Other Ambulatory Visit: Payer: Self-pay

## 2020-08-09 DIAGNOSIS — R200 Anesthesia of skin: Secondary | ICD-10-CM

## 2020-08-09 DIAGNOSIS — Z0289 Encounter for other administrative examinations: Secondary | ICD-10-CM

## 2020-08-09 DIAGNOSIS — R202 Paresthesia of skin: Secondary | ICD-10-CM

## 2020-08-09 NOTE — Progress Notes (Signed)
Full Name: Chesni Vos Gender: Female MRN #: [id number] Date of Birth: 11/04/1965    Visit Date: 08/09/2020 14:01 Age: 55 Years Examining Physician: Sarina Ill, MD  Referring Physician: Dr. Jacelyn Grip    History: Symmetrical numbness and tingling in the hands and feet  Summary: EMG/NCS was performed on the right upper and right lower extremities. All nerves and muscles (as indicated in the following tables) were within normal limits.    Conclusion: This is a normal study of the right upper and right lower extremities, no electrophysiologic evidence for mononeuropathy (carpal tunnel syndrome, ulnar neuropathy), cervical radiculopathy, polyneuropathy.  However a small fiber neuropathy could evade detection by this exam.  Sarina Ill M.D.  [facility] [address], [place], [place] [address] Tel: [phone] Fax: [phone]  Verbal informed consent was obtained from the patient, patient was informed of potential risk of procedure, including bruising, bleeding, hematoma formation, infection, muscle weakness, muscle pain, numbness, among others.        Bushong    Nerve / Sites Muscle Latency Ref. Amplitude Ref. Rel Amp Segments Distance Velocity Ref. Area    ms ms mV mV %  cm m/s m/s mVms  R Median - APB     Wrist APB 4.4 ?4.4 4.6 ?4.0 100 Wrist - APB 7   14.9     Upper arm APB 8.5  4.6  98.6 Upper arm - Wrist 22 55 ?49 15.2  R Ulnar - ADM     Wrist ADM 2.6 ?3.3 9.6 ?6.0 100 Wrist - ADM 7   35.7     B.Elbow ADM 5.7  8.6  88.8 B.Elbow - Wrist 18 58 ?49 31.1     A.Elbow ADM 7.5  8.4  98 A.Elbow - B.Elbow 10 53 ?49 30.9  R Peroneal - EDB     Ankle EDB 4.2 ?6.5 7.6 ?2.0 100 Ankle - EDB 9   22.0     Fib head EDB 9.1  6.8  89.2 Fib head - Ankle 27 55 ?44 21.0     Pop fossa EDB 11.0  6.4  94.2 Pop fossa - Fib head 10 52 ?44 20.6         Pop fossa - Ankle      R Tibial - AH     Ankle AH 3.5 ?5.8 6.3 ?4.0 100 Ankle - AH 9   17.0     Pop fossa AH 10.5   7.2  115 Pop fossa - Ankle 35 50 ?41 24.2             SNC    Nerve / Sites Rec. Site Peak Lat Ref.  Amp Ref. Segments Distance Peak Diff Ref.    ms ms V V  cm ms ms  R Sural - Ankle (Calf)     Calf Ankle 2.4 ?4.4 17 ?6 Calf - Ankle 14    R Superficial peroneal - Ankle     Lat leg Ankle 3.4 ?4.4 11 ?6 Lat leg - Ankle 14    R Median, Ulnar - Transcarpal comparison     Median Palm Wrist 2.1 ?2.2 49 ?35 Median Palm - Wrist 8       Ulnar Palm Wrist 1.9 ?2.2 23 ?12 Ulnar Palm - Wrist 8          Median Palm - Ulnar Palm  0.3 ?0.4  R Median - Orthodromic (Dig II, Mid palm)     Dig II Wrist 3.0 ?3.4  12 ?10 Dig II - Wrist 13    R Ulnar - Orthodromic, (Dig V, Mid palm)     Dig V Wrist 2.6 ?3.1 8 ?5 Dig V - Wrist 60                 F  Wave    Nerve F Lat Ref.   ms ms  R Tibial - AH 46.4 ?56.0  R Ulnar - ADM 27.4 ?32.0         EMG Summary Table    Spontaneous MUAP Recruitment  Muscle IA Fib PSW Fasc Other Amp Dur. Poly Pattern  R. Deltoid Normal None None None _______ Normal Normal Normal Normal  R. Triceps brachii Normal None None None _______ Normal Normal Normal Normal  R. Pronator teres Normal None None None _______ Normal Normal Normal Normal  R. First dorsal interosseous Normal None None None _______ Normal Normal Normal Normal  R. Opponens pollicis Normal None None None _______ Normal Normal Normal Normal  R. Vastus medialis Normal None None None _______ Normal Normal Normal Normal  R. Tibialis anterior Normal None None None _______ Normal Normal Normal Normal  R. Gastrocnemius (Medial head) Normal None None None _______ Normal Normal Normal Normal  R. Extensor hallucis longus Normal None None None _______ Normal Normal Normal Normal  R. Abductor hallucis Normal None None None _______ Normal Normal Normal Normal

## 2020-08-09 NOTE — Progress Notes (Signed)
See procedure note.

## 2020-08-09 NOTE — Progress Notes (Signed)
History: Numbness and tingling in the hands and feet   Reviewed results of EMG nerve conduction study(normal) and blood work with patient.  Including vitamin D 30.1, sed rate normal, TSH normal, CMP normal, CBC unremarkable slightly elevated white blood cells at 12.6 which she has had in the past, B1 normal, multiple myeloma panel normal, B6 normal, MMA normal, B12 greater than 2000 and folate normal. 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 Will check a few more labs for causes. In the absence of any other findings, this is likely due to neuropathy.   Personally reviewed imaging of MRI lumbar spine and agree with the following:   IMPRESSION: Abnormal MRI scan of the lumbar spine without contrast shows focal disc bulge as well as prominent facet degenerative changes at L4-5 with moderate left-sided foraminal narrowing but no definite compression.  Orders Placed This Encounter  Procedures  . ANA  . ANA, IFA (with reflex)  . Sjogren's syndrome antibods(ssa + ssb)  . Rheumatoid factor  . Pan-ANCA  . Heavy metals, blood   I spent 20 minutes of face-to-face and non-face-to-face time with patient on the  1. Numbness and tingling    diagnosis.  This included previsit chart review, lab review, study review, order entry, electronic health record documentation, patient education on the different diagnostic and therapeutic options, counseling and coordination of care, risks and benefits of management, compliance, or risk factor reduction. This does not include time spent on emg/ncs

## 2020-08-10 NOTE — Procedures (Signed)
Full Name: Chesni Vos Gender: Female MRN #: [id number] Date of Birth: 11/04/1965    Visit Date: 08/09/2020 14:01 Age: 55 Years Examining Physician: Sarina Ill, MD  Referring Physician: Dr. Jacelyn Grip    History: Symmetrical numbness and tingling in the hands and feet  Summary: EMG/NCS was performed on the right upper and right lower extremities. All nerves and muscles (as indicated in the following tables) were within normal limits.    Conclusion: This is a normal study of the right upper and right lower extremities, no electrophysiologic evidence for mononeuropathy (carpal tunnel syndrome, ulnar neuropathy), cervical radiculopathy, polyneuropathy.  However a small fiber neuropathy could evade detection by this exam.  Sarina Ill M.D.  [facility] [address], [place], [place] [address] Tel: [phone] Fax: [phone]  Verbal informed consent was obtained from the patient, patient was informed of potential risk of procedure, including bruising, bleeding, hematoma formation, infection, muscle weakness, muscle pain, numbness, among others.        Bushong    Nerve / Sites Muscle Latency Ref. Amplitude Ref. Rel Amp Segments Distance Velocity Ref. Area    ms ms mV mV %  cm m/s m/s mVms  R Median - APB     Wrist APB 4.4 ?4.4 4.6 ?4.0 100 Wrist - APB 7   14.9     Upper arm APB 8.5  4.6  98.6 Upper arm - Wrist 22 55 ?49 15.2  R Ulnar - ADM     Wrist ADM 2.6 ?3.3 9.6 ?6.0 100 Wrist - ADM 7   35.7     B.Elbow ADM 5.7  8.6  88.8 B.Elbow - Wrist 18 58 ?49 31.1     A.Elbow ADM 7.5  8.4  98 A.Elbow - B.Elbow 10 53 ?49 30.9  R Peroneal - EDB     Ankle EDB 4.2 ?6.5 7.6 ?2.0 100 Ankle - EDB 9   22.0     Fib head EDB 9.1  6.8  89.2 Fib head - Ankle 27 55 ?44 21.0     Pop fossa EDB 11.0  6.4  94.2 Pop fossa - Fib head 10 52 ?44 20.6         Pop fossa - Ankle      R Tibial - AH     Ankle AH 3.5 ?5.8 6.3 ?4.0 100 Ankle - AH 9   17.0     Pop fossa AH 10.5   7.2  115 Pop fossa - Ankle 35 50 ?41 24.2             SNC    Nerve / Sites Rec. Site Peak Lat Ref.  Amp Ref. Segments Distance Peak Diff Ref.    ms ms V V  cm ms ms  R Sural - Ankle (Calf)     Calf Ankle 2.4 ?4.4 17 ?6 Calf - Ankle 14    R Superficial peroneal - Ankle     Lat leg Ankle 3.4 ?4.4 11 ?6 Lat leg - Ankle 14    R Median, Ulnar - Transcarpal comparison     Median Palm Wrist 2.1 ?2.2 49 ?35 Median Palm - Wrist 8       Ulnar Palm Wrist 1.9 ?2.2 23 ?12 Ulnar Palm - Wrist 8          Median Palm - Ulnar Palm  0.3 ?0.4  R Median - Orthodromic (Dig II, Mid palm)     Dig II Wrist 3.0 ?3.4  12 ?10 Dig II - Wrist 13    R Ulnar - Orthodromic, (Dig V, Mid palm)     Dig V Wrist 2.6 ?3.1 8 ?5 Dig V - Wrist 2                 F  Wave    Nerve F Lat Ref.   ms ms  R Tibial - AH 46.4 ?56.0  R Ulnar - ADM 27.4 ?32.0         EMG Summary Table    Spontaneous MUAP Recruitment  Muscle IA Fib PSW Fasc Other Amp Dur. Poly Pattern  R. Deltoid Normal None None None _______ Normal Normal Normal Normal  R. Triceps brachii Normal None None None _______ Normal Normal Normal Normal  R. Pronator teres Normal None None None _______ Normal Normal Normal Normal  R. First dorsal interosseous Normal None None None _______ Normal Normal Normal Normal  R. Opponens pollicis Normal None None None _______ Normal Normal Normal Normal  R. Vastus medialis Normal None None None _______ Normal Normal Normal Normal  R. Tibialis anterior Normal None None None _______ Normal Normal Normal Normal  R. Gastrocnemius (Medial head) Normal None None None _______ Normal Normal Normal Normal  R. Extensor hallucis longus Normal None None None _______ Normal Normal Normal Normal  R. Abductor hallucis Normal None None None _______ Normal Normal Normal Normal

## 2020-08-13 LAB — PAN-ANCA
ANCA Proteinase 3: <3.5 U/mL (ref 0.0–3.5)
Atypical pANCA: <1:20 {titer}
C-ANCA: <1:20 {titer}
Myeloperoxidase Ab: <9 U/mL (ref 0.0–9.0)
P-ANCA: <1:20 {titer}

## 2020-08-13 LAB — HEAVY METALS, BLOOD
Arsenic: 24 ug/L — ABNORMAL HIGH (ref 0–9)
Lead, Blood: <1 ug/dL (ref 0–4)
Mercury: <1 ug/L (ref 0.0–14.9)

## 2020-08-13 LAB — ANA: Anti Nuclear Antibody (ANA): NEGATIVE

## 2020-08-13 LAB — SJOGREN'S SYNDROME ANTIBODS(SSA + SSB)
ENA SSA (RO) Ab: <0.2 AI (ref 0.0–0.9)
ENA SSB (LA) Ab: <0.2 AI (ref 0.0–0.9)

## 2020-08-13 LAB — RHEUMATOID FACTOR: Rheumatoid fact SerPl-aCnc: <10 IU/mL (ref <14.0)

## 2020-08-13 LAB — ANTINUCLEAR ANTIBODIES, IFA: ANA Titer 1: NEGATIVE

## 2020-08-17 ENCOUNTER — Telehealth: Payer: Self-pay | Admitting: *Deleted

## 2020-08-17 DIAGNOSIS — T570X1A Toxic effect of arsenic and its compounds, accidental (unintentional), initial encounter: Secondary | ICD-10-CM

## 2020-08-17 NOTE — Telephone Encounter (Addendum)
Spoke with Dr Jaynee Eagles who suggested patient wait a month after having seafood to have the lab redrawn. I called the pt back and let her know. Pt will plan to come for repeat lab draw at the end of June and she will call back later to schedule a lab appt.   Order placed per v.o. Dr Jaynee Eagles for blood arsenic level.

## 2020-08-17 NOTE — Telephone Encounter (Signed)
Spoke with patient and discussed lab results.  The patient could not recall if she had eaten seafood before the labs drawn on the 16th but stated she might have had a filet of fish within the last month.  Patient is willing to proceed with a retest.  She stated the last time she had any seafood was shrimp last Saturday the 21st.  If enough time has passed she can come this 2022/06/17 morning to have the lab drawn.  She will be leaving for the beach that day and returning the 31st. Patient is asking how long should she wait after having seafood to have the lab drawn. I let her know I would call her back.

## 2020-08-17 NOTE — Telephone Encounter (Signed)
-----   Message from Melvenia Beam, MD sent at 08/14/2020 10:38 AM EDT ----- Labs normal except your arsenic level is slightly elevated. This can happen if you recently ate seafood. If you have not eaten seafood then we should repeat the test, otherwise it is very common to see this after eating seafood.

## 2020-08-17 NOTE — Addendum Note (Signed)
Addended by: Gildardo Griffes on: 08/17/2020 10:41 AM   Modules accepted: Orders

## 2020-09-21 ENCOUNTER — Encounter: Payer: Self-pay | Admitting: *Deleted

## 2020-11-10 ENCOUNTER — Telehealth: Payer: Self-pay | Admitting: Neurology

## 2020-11-10 NOTE — Telephone Encounter (Signed)
Pt called asking about some more test. Pt is requesting a call back.

## 2020-11-10 NOTE — Telephone Encounter (Signed)
Spoke with patient. She forgot to have the arsenic level redrawn. Patient had seafood 2 weeks ago. Lab appt scheduled for Thurs 9/1 at 8 AM.

## 2020-11-25 ENCOUNTER — Other Ambulatory Visit: Payer: 59

## 2020-11-25 ENCOUNTER — Other Ambulatory Visit (INDEPENDENT_AMBULATORY_CARE_PROVIDER_SITE_OTHER): Payer: Self-pay

## 2020-11-25 DIAGNOSIS — T570X1A Toxic effect of arsenic and its compounds, accidental (unintentional), initial encounter: Secondary | ICD-10-CM

## 2020-11-25 DIAGNOSIS — Z0289 Encounter for other administrative examinations: Secondary | ICD-10-CM

## 2020-11-27 LAB — ARSENIC, BLOOD

## 2020-11-30 ENCOUNTER — Telehealth: Payer: Self-pay | Admitting: *Deleted

## 2020-11-30 DIAGNOSIS — T570X1A Toxic effect of arsenic and its compounds, accidental (unintentional), initial encounter: Secondary | ICD-10-CM

## 2020-11-30 NOTE — Telephone Encounter (Signed)
I called the patient and LVM (ok per DPR) letting her know that unfortunately the arsenic lab was not drawn correctly.  We will speak to the lab.  Asked the patient to come back for redraw.  Left office number and hours in message. Also advised I would send a mychart message to her.   Lab reordered. Discussed with [facility] tech.

## 2020-11-30 NOTE — Telephone Encounter (Signed)
-----   Message from Melvenia Beam, MD sent at 11/29/2020  9:39 AM EDT ----- Regarding: Incorrectly collected lab, patient needs to com eback and lab needs to know Labs was not drawn correctly. Please call patient, we will have to get it again. And please let our lab know the following: "Test not performed. Royal blue-top (no additive) tube received.  This test requires anticoagulated whole blood. Please resubmit  using a royal blue-top (EDTA) tube. " So they know what to do when she comes back thanks      ----- Message ----- From: Interface, [facility] Lab Results In Sent: 11/27/2020  11:36 AM EDT To: Melvenia Beam, MD

## 2020-12-14 NOTE — Telephone Encounter (Signed)
Spoke with patient.  She had seafood over Labor Day.  She will come in the first week of October to have the level redrawn.

## 2021-01-03 ENCOUNTER — Other Ambulatory Visit (INDEPENDENT_AMBULATORY_CARE_PROVIDER_SITE_OTHER): Payer: Self-pay

## 2021-01-03 DIAGNOSIS — Z0289 Encounter for other administrative examinations: Secondary | ICD-10-CM

## 2021-01-03 DIAGNOSIS — T570X1A Toxic effect of arsenic and its compounds, accidental (unintentional), initial encounter: Secondary | ICD-10-CM

## 2021-01-05 LAB — ARSENIC, BLOOD: Arsenic: 2 ug/L (ref 0–9)

## 2021-01-20 ENCOUNTER — Encounter (HOSPITAL_COMMUNITY): Payer: Self-pay | Admitting: *Deleted

## 2022-01-20 ENCOUNTER — Encounter (HOSPITAL_COMMUNITY): Payer: Self-pay | Admitting: *Deleted

## 2022-02-14 ENCOUNTER — Ambulatory Visit
Admission: RE | Admit: 2022-02-14 | Discharge: 2022-02-14 | Disposition: A | Discharge status: Home / Self Care | Payer: 59 | Source: Ambulatory Visit | Attending: Family Medicine | Admitting: Family Medicine

## 2022-02-14 ENCOUNTER — Other Ambulatory Visit: Payer: Self-pay | Admitting: Family Medicine

## 2022-02-14 DIAGNOSIS — M545 Low back pain, unspecified: Secondary | ICD-10-CM

## 2022-06-16 ENCOUNTER — Ambulatory Visit: Payer: 59 | Admitting: Internal Medicine

## 2022-06-16 ENCOUNTER — Encounter: Payer: Self-pay | Admitting: Internal Medicine

## 2022-06-16 VITALS — BP 144/88 | HR 98 | Ht 62.0 in | Wt 161.4 lb

## 2022-06-16 DIAGNOSIS — E782 Mixed hyperlipidemia: Secondary | ICD-10-CM

## 2022-06-16 DIAGNOSIS — R9431 Abnormal electrocardiogram [ECG] [EKG]: Secondary | ICD-10-CM

## 2022-06-16 DIAGNOSIS — E119 Type 2 diabetes mellitus without complications: Secondary | ICD-10-CM

## 2022-06-16 DIAGNOSIS — R002 Palpitations: Secondary | ICD-10-CM

## 2022-06-16 DIAGNOSIS — I10 Essential (primary) hypertension: Secondary | ICD-10-CM

## 2022-06-16 MED ORDER — METOPROLOL SUCCINATE ER 25 MG PO TB24: 25.0000 mg | ORAL_TABLET | Freq: Every day | ORAL | 3 refills | Status: DC

## 2022-06-16 NOTE — Progress Notes (Signed)
Primary Physician/Referring:  Vernie Shanks, MD (Inactive)  Patient ID: Stacey Riddle, female    DOB: 1966-03-01, 57 y.o.   MRN: [id number]  Chief Complaint  Patient presents with   Abnormal ECG   New Patient (Initial Visit)   Palpitations   HPI:    Stacey Riddle  is a 57 y.o. female with past medical history significant for hypertension, hyperlipidemia, and diabetes who is here to establish care with cardiology.  Patient has been feeling much more tired lately.  She also endorses palpitations and irregular heart rates that have become very bothersome lately.  She does not have as much energy as she used to but nothing else has really changed for her.  She denies cardiac history in herself.  Patient denies chest pain, shortness of breath, diaphoresis, syncope, edema, orthopnea, PND, claudication.  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  Her thyroid was recently checked and it was within normal limits.  Past Medical History:  Diagnosis Date   GERD (gastroesophageal reflux disease)    H/O laparoscopic adjustable gastric banding 04/09/2007   History of cervical dysplasia    History of hiatal hernia    S/P  REPAIR 04-09-2007   History of kidney stones    History of polycystic ovarian syndrome    Hypertension    Left ureteral stone    Low back pain    Neuropathy    Type 2 diabetes mellitus ([place])    Wears contact lenses    Past Surgical History:  Procedure Laterality Date   CARPAL TUNNEL RELEASE Bilateral right 2000/  left 10-02-2008   CESAREAN SECTION  03/16/2005   CHOLECYSTECTOMY N/A 11/23/2016   Procedure: LAPAROSCOPIC CHOLECYSTECTOMY WITH INTRAOPERATIVE CHOLANGIOGRAM;  Surgeon: Johnathan Hausen, MD;  Location: [facility];  Service: General;  Laterality: N/A;   CYSTOSCOPY WITH RETROGRADE PYELOGRAM, URETEROSCOPY AND  STENT PLACEMENT Left 01/29/2017   Procedure: CYSTOSCOPY WITH RETROGRADE PYELOGRAM, URETEROSCOPY, LASER LITHOTRIPSY, STONE BASKETRY,  [place];  Surgeon: Nickie Retort, MD;  Location: [facility];  Service: Urology;  Laterality: Left;   HOLMIUM LASER APPLICATION Left 0000000   Procedure: HOLMIUM LASER APPLICATION;  Surgeon: Nickie Retort, MD;  Location: [facility];  Service: Urology;  Laterality: Left;   LAPAROSCOPIC GASTRIC BAND REMOVAL WITH LAPAROSCOPIC GASTRIC SLEEVE RESECTION N/A 07/01/2019   Procedure: LAPAROSCOPIC GASTRIC BAND REMOVAL WITH LAPAROSCOPIC GASTRIC SLEEVE RESECTION, Upper Endo, ERAS Pathway;  Surgeon: Johnathan Hausen, MD;  Location: [facility];  Service: General;  Laterality: N/A;   LAPAROSCOPIC GASTRIC BANDING WITH HIATAL HERNIA REPAIR  04-09-2007   dr Hassell Done [facility]   REPLACEMENT LAP BAND PORT  02-14-2010   dr Hassell Done [facility]   Family History  Problem Relation Age of Onset   Hyperlipidemia Mother    Osteoporosis Mother    Hypertension Mother    Breast cancer Mother    Heart disease Father    Diabetes Father    Hypertension Father    Kidney disease Sister    Heart attack Sister    Diabetes Maternal Grandmother     Social History   Tobacco Use   Smoking status: Never   Smokeless tobacco: Never  Substance Use Topics   Alcohol use: Yes    Comment: rare   Marital Status: Married  ROS  Review of Systems  Constitutional: Positive for malaise/fatigue.  Cardiovascular:  Positive for irregular heartbeat and palpitations.   Objective  Blood pressure (!) 144/88, pulse 98, height 5\' 2"  (1.575 m), weight 161 lb 6.4 oz (73.2 kg), SpO2 98 %. Body mass index is 29.52 kg/m.     06/16/2022   12:32 PM 06/25/2020   10:55 AM 01/08/2020    5:54 PM  Vitals with BMI  Height 5\' 2"  5\' 2"  5\' 2"   Weight 161 lbs 6 oz 171 lbs 6 oz 165 lbs 10 oz  BMI 29.51 [id number]  Systolic [id number] 0000000   Diastolic 88 82   Pulse 98 78      Physical  Exam Vitals reviewed.  HENT:     Head: Normocephalic and atraumatic.  Cardiovascular:     Rate and Rhythm: Normal rate and regular rhythm.     Pulses: Normal pulses.     Heart sounds: Normal heart sounds. No murmur heard. Pulmonary:     Effort: Pulmonary effort is normal.     Breath sounds: Normal breath sounds.  Abdominal:     General: Bowel sounds are normal.  Musculoskeletal:     Right lower leg: No edema.     Left lower leg: No edema.  Skin:    General: Skin is warm and dry.  Neurological:     Mental Status: She is alert.     Medications and allergies   Allergies  Allergen Reactions   Biaxin [Clarithromycin] Other (See Comments)    Stomach cramps     Medication list after today's encounter   Current Outpatient Medications:    amLODipine (NORVASC) 5 MG tablet, Take 5 mg by mouth daily., Disp: , Rfl:    cetirizine (ZYRTEC) 10 MG tablet, Take 10 mg by mouth daily., Disp: , Rfl:    DULoxetine (CYMBALTA) 20 MG capsule, Take 20 mg by mouth daily., Disp: , Rfl:    losartan (COZAAR) 100 MG tablet, Take 100 mg by mouth daily. , Disp: , Rfl:    metFORMIN (GLUCOPHAGE) 500 MG tablet, Take 500 mg by mouth 2 (two) times daily., Disp: , Rfl:    metoprolol succinate (TOPROL XL) 25 MG 24 hr tablet, Take 1 tablet (25 mg total) by mouth daily., Disp: 90 tablet, Rfl: 3   Multiple Vitamin (MULTIVITAMIN WITH MINERALS) TABS, Take 1 tablet by mouth at bedtime. , Disp: , Rfl:    Probiotic Product (PRO-BIOTIC BLEND PO), Take by mouth., Disp: , Rfl:    rosuvastatin (CRESTOR) 10 MG tablet, Take 10 mg by mouth every Monday, Wednesday, and Friday., Disp: , Rfl:    Semaglutide,0.25 or 0.5MG /DOS, (OZEMPIC, 0.25 OR 0.5 MG/DOSE,) 2 MG/1.5ML SOPN, Inject into the skin once a week., Disp: , Rfl:   Laboratory examination:   Lab Results  Component Value Date   NA 140 06/25/2020   K 4.2 06/25/2020   CO2 22 06/25/2020   GLUCOSE 95 06/25/2020   BUN 12 06/25/2020   CREATININE 0.75 06/25/2020    CALCIUM 9.3 06/25/2020   EGFR 94 06/25/2020   GFRNONAA >60 10/03/2019       Latest Ref Rng & Units 06/25/2020   12:13 PM 10/03/2019   12:45 PM 07/02/2019    4:56 AM  CMP  Glucose 65 - 99 mg/dL 95  97  198   BUN 6 - 24 mg/dL 12  14  11    Creatinine 0.57 - 1.00 mg/dL 0.75  0.87  0.95   Sodium 134 - 144 mmol/L 140  141  133  Potassium 3.5 - 5.2 mmol/L 4.2  4.6  4.0   Chloride 96 - 106 mmol/L 104  107  98   CO2 20 - 29 mmol/L 22  25  23    Calcium 8.7 - 10.2 mg/dL 9.3  9.6  8.8   Total Protein 6.0 - 8.5 g/dL 6.6  7.6    Total Bilirubin 0.0 - 1.2 mg/dL <0.2  0.5    Alkaline Phos 44 - 121 IU/L 101  67    AST 0 - 40 IU/L 18  21    ALT 0 - 32 IU/L 10  17        Latest Ref Rng & Units 06/25/2020   12:13 PM 10/03/2019   12:45 PM 07/03/2019    4:45 AM  CBC  WBC 3.4 - 10.8 x10E3/uL 12.6  8.7  17.5   Hemoglobin 11.1 - 15.9 g/dL 11.5  11.6  11.8   Hematocrit 34.0 - 46.6 % 33.3  36.6  36.7   Platelets 150 - 450 x10E3/uL 271  225  202     Lipid Panel No results for input(s): "CHOL", "TRIG", "[place]", "VLDL", "HDL", "CHOLHDL", "LDLDIRECT" in the last 8760 hours.  HEMOGLOBIN A1C Lab Results  Component Value Date   HGBA1C 7.2 (H) 06/20/2019   MPG 159.94 06/20/2019   TSH No results for input(s): "TSH" in the last 8760 hours.  External labs:   Labs 06/07/2022: Cholesterol 200 triglycerides 119 HDL 43 LDL 135 Vitamin D level 36.8 Glucose 113 BUN 17 creatinine 0.81 potassium 3.5 Hemoglobin 11 hematocrit 34 platelets 292 Lipase 81 Amylase 98 TSH 1.49   Radiology:    Cardiac Studies:     EKG:   06/16/2022: Sinus Rhythm, rate 91 bpm. Right bundle branch block with left axis with bifascicular block.  Assessment     ICD-10-CM   1. Nonspecific abnormal electrocardiogram (ECG) (EKG)  R94.31 EKG 12-Lead    PCV ECHOCARDIOGRAM COMPLETE    PCV MYOCARDIAL PERFUSION WO LEXISCAN    LONG TERM MONITOR (3-14 DAYS)    2. Essential hypertension  I10 PCV ECHOCARDIOGRAM COMPLETE    PCV  MYOCARDIAL PERFUSION WO LEXISCAN    LONG TERM MONITOR (3-14 DAYS)    3. Mixed hyperlipidemia  E78.2 PCV ECHOCARDIOGRAM COMPLETE    PCV MYOCARDIAL PERFUSION WO LEXISCAN    LONG TERM MONITOR (3-14 DAYS)    4. Type 2 diabetes mellitus without complication, without long-term current use of insulin (HCC)  E11.9 PCV ECHOCARDIOGRAM COMPLETE    PCV MYOCARDIAL PERFUSION WO LEXISCAN    LONG TERM MONITOR (3-14 DAYS)    5. Palpitations  R00.2 PCV ECHOCARDIOGRAM COMPLETE    PCV MYOCARDIAL PERFUSION WO LEXISCAN    LONG TERM MONITOR (3-14 DAYS)       Orders Placed This Encounter  Procedures   PCV MYOCARDIAL PERFUSION WO LEXISCAN    Standing Status:   Future    Standing Expiration Date:   08/16/2022   LONG TERM MONITOR (3-14 DAYS)    Standing Status:   Future    Number of Occurrences:   1    Standing Expiration Date:   06/16/2023    Order Specific Question:   Where should this test be performed?    Answer:   [facility]    Order Specific Question:   Does the patient have an implanted cardiac device?    Answer:   No    Order Specific Question:   Prescribed days of wear    Answer:   14  Order Specific Question:   Type of enrollment    Answer:   Clinic Enrollment    Order Specific Question:   Release to patient    Answer:   Immediate   EKG 12-Lead   PCV ECHOCARDIOGRAM COMPLETE    Standing Status:   Future    Standing Expiration Date:   06/16/2023    Meds ordered this encounter  Medications   metoprolol succinate (TOPROL XL) 25 MG 24 hr tablet    Sig: Take 1 tablet (25 mg total) by mouth daily.    Dispense:  90 tablet    Refill:  3    Medications Discontinued During This Encounter  Medication Reason   amLODipine (NORVASC) 2.5 MG tablet Dose change   potassium chloride SA (K-DUR,KLOR-CON) 20 MEQ tablet Completed Course   phentermine 37.5 MG capsule Completed Course   pantoprazole (PROTONIX) 40 MG tablet Completed Course   estradiol (ESTRACE) 1 MG tablet Completed Course    gabapentin (NEURONTIN) 300 MG capsule Completed Course   levonorgestrel (MIRENA) 20 MCG/24HR IUD Completed Course   Biotin w/ Vitamins C & E (HAIR/SKIN/NAILS PO) Completed Course   Calcium Carbonate (CALCIUM 500 PO) Completed Course     Recommendations:   Stacey Riddle is a 57 y.o.  female with fatigue, palpitations, and abnormal EKG  Nonspecific abnormal electrocardiogram (ECG) (EKG) Echocardiogram and stress test ordered  Essential hypertension Continue current cardiac medications. Encourage low-sodium diet, less than 2000 mg daily. Will add Toprol for palpitations and it should help her BP get to goal Follow-up in 1-2 months or sooner if needed.  Mixed hyperlipidemia Continue Crestor, LDL uncontrolled Will discuss increasing dose at follow-up visit  Type 2 diabetes mellitus without complication, without long-term current use of insulin ([place]) On metformin and Ozempic PCP following  Palpitations Will obtain event monitor     Stacey Flock, DO, [facility]  06/16/2022, 1:13 PM Office: [phone] Pager: [phone]

## 2022-06-28 ENCOUNTER — Other Ambulatory Visit: Payer: 59

## 2022-06-29 ENCOUNTER — Ambulatory Visit: Payer: 59

## 2022-06-29 DIAGNOSIS — E782 Mixed hyperlipidemia: Secondary | ICD-10-CM

## 2022-06-29 DIAGNOSIS — E119 Type 2 diabetes mellitus without complications: Secondary | ICD-10-CM

## 2022-06-29 DIAGNOSIS — R002 Palpitations: Secondary | ICD-10-CM

## 2022-06-29 DIAGNOSIS — I10 Essential (primary) hypertension: Secondary | ICD-10-CM

## 2022-06-29 DIAGNOSIS — R9431 Abnormal electrocardiogram [ECG] [EKG]: Secondary | ICD-10-CM

## 2022-06-30 NOTE — Progress Notes (Signed)
normal

## 2022-06-30 NOTE — Progress Notes (Signed)
Pt aware.

## 2022-07-03 NOTE — Progress Notes (Signed)
Pt aware.

## 2022-07-03 NOTE — Progress Notes (Signed)
normal

## 2022-07-27 ENCOUNTER — Ambulatory Visit: Payer: 59 | Admitting: Internal Medicine

## 2022-07-28 ENCOUNTER — Ambulatory Visit: Payer: 59 | Admitting: Internal Medicine

## 2022-07-31 ENCOUNTER — Ambulatory Visit: Payer: 59 | Admitting: Internal Medicine

## 2022-07-31 ENCOUNTER — Encounter: Payer: Self-pay | Admitting: Internal Medicine

## 2022-07-31 VITALS — BP 150/87 | HR 84 | Ht 62.0 in | Wt 160.0 lb

## 2022-07-31 DIAGNOSIS — I10 Essential (primary) hypertension: Secondary | ICD-10-CM

## 2022-07-31 DIAGNOSIS — R002 Palpitations: Secondary | ICD-10-CM

## 2022-07-31 DIAGNOSIS — E782 Mixed hyperlipidemia: Secondary | ICD-10-CM

## 2022-07-31 DIAGNOSIS — E119 Type 2 diabetes mellitus without complications: Secondary | ICD-10-CM

## 2022-07-31 NOTE — Progress Notes (Signed)
Primary Physician/Referring:  Ileana Ladd, MD (Inactive)  Patient ID: Stacey Riddle, female    DOB: 01-21-1966, 57 y.o.   MRN: [id number]  Chief Complaint  Patient presents with   Nonspecific abnormal electrocardiogram   Follow-up   Results   HPI:    Vanilla Coombs  is a 57 y.o. female with past medical history significant for hypertension, hyperlipidemia, and diabetes who is here for a follow-up visit. She has been feeling well since the last time she was here. No complaints or concerns today. She did not take her meds until earlier this afternoon so that is why her blood pressure is a little bit elevated right now. She feels really good with the addition of metoprolol and has not had any more palpitations. She feels really good on this regiment. Patient denies chest pain, shortness of breath, diaphoresis, syncope, edema, orthopnea, PND, claudication.    Past Medical History:  Diagnosis Date   GERD (gastroesophageal reflux disease)    H/O laparoscopic adjustable gastric banding 04/09/2007   History of cervical dysplasia    History of hiatal hernia    S/P  REPAIR 04-09-2007   History of kidney stones    History of polycystic ovarian syndrome    Hypertension    Left ureteral stone    Low back pain    Neuropathy    Type 2 diabetes mellitus (HCC)    Wears contact lenses    Past Surgical History:  Procedure Laterality Date   CARPAL TUNNEL RELEASE Bilateral right 2000/  left 10-02-2008   CESAREAN SECTION  03/16/2005   CHOLECYSTECTOMY N/A 11/23/2016   Procedure: LAPAROSCOPIC CHOLECYSTECTOMY WITH INTRAOPERATIVE CHOLANGIOGRAM;  Surgeon: Luretha Murphy, MD;  Location: [facility];  Service: General;  Laterality: N/A;   CYSTOSCOPY WITH RETROGRADE PYELOGRAM, URETEROSCOPY AND STENT PLACEMENT Left 01/29/2017   Procedure: CYSTOSCOPY WITH RETROGRADE PYELOGRAM, URETEROSCOPY, LASER LITHOTRIPSY, STONE BASKETRY,  STENT PLACEMENT;  Surgeon: Hildred Laser, MD;  Location: [facility];  Service: Urology;  Laterality: Left;   HOLMIUM LASER APPLICATION Left 01/29/2017   Procedure: HOLMIUM LASER APPLICATION;  Surgeon: Hildred Laser, MD;  Location: [facility];  Service: Urology;  Laterality: Left;   LAPAROSCOPIC GASTRIC BAND REMOVAL WITH LAPAROSCOPIC GASTRIC SLEEVE RESECTION N/A 07/01/2019   Procedure: LAPAROSCOPIC GASTRIC BAND REMOVAL WITH LAPAROSCOPIC GASTRIC SLEEVE RESECTION, Upper Endo, ERAS Pathway;  Surgeon: Luretha Murphy, MD;  Location: [facility];  Service: General;  Laterality: N/A;   LAPAROSCOPIC GASTRIC BANDING WITH HIATAL HERNIA REPAIR  04-09-2007   dr Daphine Deutscher [facility]   REPLACEMENT LAP BAND PORT  02-14-2010   dr Daphine Deutscher [facility]   Family History  Problem Relation Age of Onset   Hyperlipidemia Mother    Osteoporosis Mother    Hypertension Mother    Breast cancer Mother    Heart disease Father    Diabetes Father    Hypertension Father    Kidney disease Sister    Heart attack Sister    Diabetes Maternal Grandmother     Social History   Tobacco Use   Smoking status: Never   Smokeless tobacco: Never  Substance Use Topics   Alcohol use: Yes    Comment: rare   Marital Status: Married  ROS  Review of Systems  Constitutional: Negative for malaise/fatigue.  Cardiovascular:  Negative for irregular heartbeat and palpitations.   Objective  Blood pressure (!) 150/87, pulse 84, height 5\' 2"  (1.575 m), weight 160 lb (72.6 kg), SpO2 99 %. Body mass index is 29.26  kg/m.     07/31/2022    1:57 PM 07/31/2022    1:53 PM 06/16/2022   12:32 PM  Vitals with BMI  Height  5\' 2"  5\' 2"   Weight  160 lbs 161 lbs 6 oz  BMI  29.26 29.51  Systolic 150 161 161  Diastolic 87 90 88  Pulse 84 85 98     Physical Exam Vitals reviewed.  HENT:     Head: Normocephalic and atraumatic.  Cardiovascular:     Rate and Rhythm: Normal rate and regular rhythm.     Pulses: Normal pulses.     Heart sounds: Normal heart sounds. No murmur heard. Pulmonary:      Effort: Pulmonary effort is normal.     Breath sounds: Normal breath sounds.  Abdominal:     General: Bowel sounds are normal.  Musculoskeletal:     Right lower leg: No edema.     Left lower leg: No edema.  Skin:    General: Skin is warm and dry.  Neurological:     Mental Status: She is alert.     Medications and allergies   Allergies  Allergen Reactions   Biaxin [Clarithromycin] Other (See Comments)    Stomach cramps     Medication list after today's encounter   Current Outpatient Medications:    amLODipine (NORVASC) 5 MG tablet, Take 5 mg by mouth daily., Disp: , Rfl:    cetirizine (ZYRTEC) 10 MG tablet, Take 10 mg by mouth daily., Disp: , Rfl:    DULoxetine (CYMBALTA) 20 MG capsule, Take 20 mg by mouth daily., Disp: , Rfl:    losartan (COZAAR) 100 MG tablet, Take 100 mg by mouth daily. , Disp: , Rfl:    metFORMIN (GLUCOPHAGE) 500 MG tablet, Take 500 mg by mouth 2 (two) times daily., Disp: , Rfl:    metoprolol succinate (TOPROL XL) 25 MG 24 hr tablet, Take 1 tablet (25 mg total) by mouth daily., Disp: 90 tablet, Rfl: 3   Multiple Vitamin (MULTIVITAMIN WITH MINERALS) TABS, Take 1 tablet by mouth at bedtime. , Disp: , Rfl:    Probiotic Product (PRO-BIOTIC BLEND PO), Take by mouth., Disp: , Rfl:    rosuvastatin (CRESTOR) 10 MG tablet, Take 10 mg by mouth every Monday, Wednesday, and Friday., Disp: , Rfl:    Semaglutide,0.25 or 0.5MG /DOS, (OZEMPIC, 0.25 OR 0.5 MG/DOSE,) 2 MG/1.5ML SOPN, Inject into the skin once a week., Disp: , Rfl:   Laboratory examination:   Lab Results  Component Value Date   NA 140 06/25/2020   K 4.2 06/25/2020   CO2 22 06/25/2020   GLUCOSE 95 06/25/2020   BUN 12 06/25/2020   CREATININE 0.75 06/25/2020   CALCIUM 9.3 06/25/2020   EGFR 94 06/25/2020   GFRNONAA >60 10/03/2019       Latest Ref Rng & Units 06/25/2020   12:13 PM 10/03/2019   12:45 PM 07/02/2019    4:56 AM  CMP  Glucose 65 - 99 mg/dL 95  97  096   BUN 6 - 24 mg/dL 12  14  11     Creatinine 0.57 - 1.00 mg/dL 0.45  4.09  8.11   Sodium 134 - 144 mmol/L 140  141  133   Potassium 3.5 - 5.2 mmol/L 4.2  4.6  4.0   Chloride 96 - 106 mmol/L 104  107  98   CO2 20 - 29 mmol/L 22  25  23    Calcium 8.7 - 10.2 mg/dL 9.3  9.6  8.8  Total Protein 6.0 - 8.5 g/dL 6.6  7.6    Total Bilirubin 0.0 - 1.2 mg/dL <1.6  0.5    Alkaline Phos 44 - 121 IU/L 101  67    AST 0 - 40 IU/L 18  21    ALT 0 - 32 IU/L 10  17        Latest Ref Rng & Units 06/25/2020   12:13 PM 10/03/2019   12:45 PM 07/03/2019    4:45 AM  CBC  WBC 3.4 - 10.8 x10E3/uL 12.6  8.7  17.5   Hemoglobin 11.1 - 15.9 g/dL 10.9  60.4  54.0   Hematocrit 34.0 - 46.6 % 33.3  36.6  36.7   Platelets 150 - 450 x10E3/uL 271  225  202     Lipid Panel No results for input(s): "CHOL", "TRIG", "LDLCALC", "VLDL", "HDL", "CHOLHDL", "LDLDIRECT" in the last 8760 hours.  HEMOGLOBIN A1C Lab Results  Component Value Date   HGBA1C 7.2 (H) 06/20/2019   MPG 159.94 06/20/2019   TSH No results for input(s): "TSH" in the last 8760 hours.  External labs:   Labs 06/07/2022: Cholesterol 200 triglycerides 119 HDL 43 LDL 135 Vitamin D level 36.8 Glucose 113 BUN 17 creatinine 0.81 potassium 3.5 Hemoglobin 11 hematocrit 34 platelets 292 Lipase 81 Amylase 98 TSH 1.49   Radiology:    Cardiac Studies:   Exercise Sestamibi stress test 06/29/2022: Exercise nuclear stress test was performed using Bruce protocol.   1 Day Rest and Stress images. Exercise time 5 minutes 25 seconds on Bruce protocol, achieved 7.05 METS, 90% of age-predicted maximum heart rate (APMHR).  Hypertensive response to exercise: Yes (rest 150/86, peak 200/80 mmHg). Stress ECG nondiagnostic for ischemia  Small size, mild intensity, fixed perfusion defect involving the basal inferior inferolateral segments likely secondary to attenuation artifact.  No convincing evidence of reversible myocardial ischemia or prior infarct. Calculated LVEF is 53%, visually appears normal,  wall thickness is preserved, no regional wall motion abnormalities, LV size is normal. No prior studies available for comparison. Low risk study.   Echocardiogram 06/29/2022: Normal LV systolic function with visual EF 55-60%. Left ventricle cavity is normal in size. Mild concentric hypertrophy of the left ventricle. Normal global wall motion. Normal diastolic filling pattern, normal LAP. Calculated EF 59%. Structurally normal mitral valve.  Mild (Grade I) mitral regurgitation. Structurally normal tricuspid valve with trace regurgitation. No evidence of pulmonary hypertension. No prior available for comparison.    Patch Wear Time:  13 days and 19 hours (2024-04-04T12:31:20-0400 to 2024-04-18T07:31:56-0400) Patient had a min HR of 59 bpm, max HR of 153 bpm, and avg HR of 85 bpm. Predominant underlying rhythm was Sinus Rhythm. Isolated SVEs were rare (<1.0%), and no SVE Couplets or SVE Triplets were present. Isolated VEs were rare (<1.0%), and no VE Couplets  or VE Triplets were present. Ventricular Trigeminy was present.  EKG:   06/16/2022: Sinus Rhythm, rate 91 bpm. Right bundle branch block with left axis with bifascicular block.  Assessment     ICD-10-CM   1. Essential hypertension  I10     2. Mixed hyperlipidemia  E78.2     3. Type 2 diabetes mellitus without complication, without long-term current use of insulin (HCC)  E11.9     4. Palpitations  R00.2         No orders of the defined types were placed in this encounter.   No orders of the defined types were placed in this encounter.   There are no  discontinued medications.    Recommendations:   Nyellie Winding is a 57 y.o.  female with HTN, HLD, DM2  Essential hypertension Continue current cardiac medications. Encourage low-sodium diet, less than 2000 mg daily. She feels much better with addition of Toprol and says at home her BP is controlled. Right now it is high because she forgot to take her meds and just  recently took them Follow-up in 6 months or sooner if needed.  Mixed hyperlipidemia Continue Crestor Primary is managing this   Type 2 diabetes mellitus without complication, without long-term current use of insulin (HCC) On metformin and Ozempic PCP following  Palpitations No arrhythmias on event monitor Resolved with addition of Toprol Echocardiogram and stress test without ischemia Results discussed with patient, all questions answered     Clotilde Dieter, DO, [facility]  07/31/2022, 2:11 PM Office: [phone] Pager: [phone]

## 2022-08-07 ENCOUNTER — Other Ambulatory Visit: Payer: Self-pay

## 2022-08-07 MED ORDER — METOPROLOL SUCCINATE ER 25 MG PO TB24: 25.0000 mg | ORAL_TABLET | Freq: Every day | ORAL | 3 refills | Status: DC

## 2022-11-08 ENCOUNTER — Encounter (HOSPITAL_COMMUNITY): Payer: Self-pay | Admitting: *Deleted

## 2022-11-08 DIAGNOSIS — M25561 Pain in right knee: Secondary | ICD-10-CM | POA: Insufficient documentation

## 2022-11-08 DIAGNOSIS — Z5321 Procedure and treatment not carried out due to patient leaving prior to being seen by health care provider: Secondary | ICD-10-CM | POA: Insufficient documentation

## 2022-11-08 NOTE — ED Triage Notes (Signed)
[place] and [place] on water; c/o  R knee pain, full rom and ambulatory  EMS VS 178/90 pulse 78, 97% RA

## 2022-11-08 NOTE — ED Notes (Signed)
Pt left without being seen.

## 2022-11-10 ENCOUNTER — Encounter (HOSPITAL_COMMUNITY): Payer: Self-pay

## 2022-11-10 DIAGNOSIS — M25561 Pain in right knee: Secondary | ICD-10-CM | POA: Insufficient documentation

## 2022-11-10 DIAGNOSIS — W19XXXA Unspecified fall, initial encounter: Secondary | ICD-10-CM

## 2022-11-10 DIAGNOSIS — M79661 Pain in right lower leg: Secondary | ICD-10-CM | POA: Diagnosis not present

## 2022-11-10 DIAGNOSIS — W010XXA Fall on same level from slipping, tripping and stumbling without subsequent striking against object, initial encounter: Secondary | ICD-10-CM | POA: Insufficient documentation

## 2022-11-10 DIAGNOSIS — Z7984 Long term (current) use of oral hypoglycemic drugs: Secondary | ICD-10-CM | POA: Insufficient documentation

## 2022-11-10 DIAGNOSIS — Z20822 Contact with and (suspected) exposure to covid-19: Secondary | ICD-10-CM | POA: Insufficient documentation

## 2022-11-10 DIAGNOSIS — Z79899 Other long term (current) drug therapy: Secondary | ICD-10-CM | POA: Insufficient documentation

## 2022-11-10 NOTE — Progress Notes (Signed)
Lower extremity venous duplex completed. Please see CV Procedures for preliminary results.  Shona Simpson, RVT 11/10/22 8:53 AM

## 2022-11-10 NOTE — Progress Notes (Signed)
Orthopedic Tech Progress Note Patient Details:  Stacey Riddle 1965-12-22 [id number] Applied knee brace per order.  Ortho Devices Type of Ortho Device: Knee Sleeve Ortho Device/Splint Location: RLE Ortho Device/Splint Interventions: Ordered, Application, Adjustment   Post Interventions Patient Tolerated: Well Instructions Provided: Adjustment of device, Care of device  Blase Mess 11/10/2022, 10:29 AM

## 2022-11-10 NOTE — Discharge Instructions (Addendum)
It was a pleasure taking care of you here in the emergency department  Your x-ray did not show any evidence of broken bones or soft tissue swelling  Ultrasound did not show blood clot  Likely have a sprain or strain.  We have placed you in a knee sleeve.  I have written for medication to help.  Make sure you do follow-up with your orthopedic if your symptoms do not improve within the week  Return for new or worsening symptoms such as severe swelling, redness, inability to walk

## 2022-11-10 NOTE — ED Triage Notes (Signed)
Pt reports [place] in the mall on [place], pt seen here and had xrays but states she had to leave before seeing a provider. Pt taking ibuprofen for the pain. Pt ambulatory.

## 2022-11-10 NOTE — ED Notes (Signed)
Discharge instructions reviewed with patient. Pt verbalized understanding and had no further questions. Pt a&ox4 and in no acute distress at time of discharge.

## 2022-11-10 NOTE — ED Provider Notes (Signed)
[facility] Provider Note   CSN: [id number] Arrival date & time: 11/10/22  6578     History  Chief Complaint  Patient presents with   Leg Pain    Stacey [facility] is a 57 y.o. female for evaluation mechanical fall.  Slipped and fell 2 days ago.  Initially was seen here had x-ray performed however left without seeing provider.  She has been taking Ibuprofen at home.  Her pain is now more occluded to the posterior aspect of her knee.  Does not extend to tib-fib, foot, femur.  Ambulatory however with some pain.  No back pain.  Denies hitting her head.  No redness, swelling, warmth to extremities.  No N/ weakness  HPI     Home Medications Prior to Admission medications   Medication Sig Start Date End Date Taking? Authorizing Provider  lidocaine (LIDODERM) 5 % Place 1 patch onto the skin daily. Remove & Discard patch within 12 hours or as directed by MD 11/10/22  Yes Tavi Gaughran A, PA-C  naproxen (NAPROSYN) 500 MG tablet Take 1 tablet (500 mg total) by mouth 2 (two) times daily. 11/10/22  Yes Kyzen Horn A, PA-C  amLODipine (NORVASC) 5 MG tablet Take 5 mg by mouth daily.    [provider]  cetirizine (ZYRTEC) 10 MG tablet Take 10 mg by mouth daily.    [provider]  DULoxetine (CYMBALTA) 20 MG capsule Take 20 mg by mouth daily.    [provider]  losartan (COZAAR) 100 MG tablet Take 100 mg by mouth daily.     [provider]  metFORMIN (GLUCOPHAGE) 500 MG tablet Take 500 mg by mouth 2 (two) times daily. 05/29/19   [provider]  metoprolol succinate (TOPROL XL) 25 MG 24 hr tablet Take 1 tablet (25 mg total) by mouth daily. 08/07/22   Patwardhan, Anabel Bene, MD  Multiple Vitamin (MULTIVITAMIN WITH MINERALS) TABS Take 1 tablet by mouth at bedtime.     [provider]  Probiotic Product (PRO-BIOTIC BLEND PO) Take by mouth.    [provider]  rosuvastatin (CRESTOR) 10 MG  tablet Take 10 mg by mouth every Monday, Wednesday, and Friday. 01/28/19   [provider]  Semaglutide,0.25 or 0.5MG /DOS, (OZEMPIC, 0.25 OR 0.5 MG/DOSE,) 2 MG/1.5ML SOPN Inject into the skin once a week.    [provider]      Allergies    Biaxin [clarithromycin]    Review of Systems   Review of Systems  Constitutional: Negative.   HENT: Negative.    Respiratory: Negative.    Cardiovascular: Negative.   Gastrointestinal: Negative.   Genitourinary: Negative.   Musculoskeletal:        Right knee pain  Skin: Negative.   Neurological: Negative.   All other systems reviewed and are negative.   Physical Exam Updated Vital Signs BP (!) 152/115 (BP Location: Right Arm)   Pulse 76   Temp 98.1 F (36.7 C) (Oral)   Resp 19   SpO2 94%  Physical Exam Vitals and nursing note reviewed.  Constitutional:      General: She is not in acute distress.    Appearance: She is well-developed. She is not ill-appearing, toxic-appearing or diaphoretic.  HENT:     Head: Atraumatic.  Eyes:     Pupils: Pupils are equal, round, and reactive to light.  Cardiovascular:     Rate and Rhythm: Normal rate.     Pulses: Normal pulses.  Dorsalis pedis pulses are 2+ on the right side.       Posterior tibial pulses are 2+ on the right side.     Heart sounds: Normal heart sounds.  Pulmonary:     Effort: No respiratory distress.  Abdominal:     General: There is no distension.  Musculoskeletal:        General: Normal range of motion.     Cervical back: Normal range of motion.       Legs:     Comments: Nontender foot, tib-fib, femur.  No midline T/L back pain.  Diffuse tenderness popliteal fossa on right.  Patella midline.  Able to straight leg raise.  Can flex and extend however has some mild pain.  Nontender to tibial plateau.  Skin:    General: Skin is warm and dry.     Capillary Refill: Capillary refill takes less than 2 seconds.     Comments: No edema, erythema or warmth.   No rash or lesions  Neurological:     General: No focal deficit present.     Mental Status: She is alert.     Sensory: Sensation is intact.     Motor: Motor function is intact.     Gait: Gait is intact.     Comments: Intact sensation Ambulatory  Psychiatric:        Mood and Affect: Mood normal.     ED Results / Procedures / Treatments   Labs (all labs ordered are listed, but only abnormal results are displayed) Labs Reviewed  SARS CORONAVIRUS 2 BY RT PCR    EKG None  Radiology VAS [place] LOWER EXTREMITY VENOUS (DVT) (ONLY [facility] & [facility])  Result Date: 11/10/2022  Lower Venous DVT Study Patient Name:  Stacey R[facility]  Date of Exam:   11/10/2022 Medical Rec #: [id number]            Accession #:    [id number] Date of Birth: Jul 14, 1965             Patient Gender: F Patient Age:   57 years Exam Location:  [facility] Procedure:      VAS [place] LOWER EXTREMITY VENOUS (DVT) Referring Phys: Yaqub Arney --------------------------------------------------------------------------------  Indications: Pain.  Risk Factors: Trauma Recent fall. Comparison Study: No prior study Performing Technologist: Shona Simpson  Examination Guidelines: A complete evaluation includes B-mode imaging, spectral Doppler, color Doppler, and power Doppler as needed of all accessible portions of each vessel. Bilateral testing is considered an integral part of a complete examination. Limited examinations for reoccurring indications may be performed as noted. The reflux portion of the exam is performed with the patient in reverse Trendelenburg.  +---------+---------------+---------+-----------+----------+--------------+ RIGHT    CompressibilityPhasicitySpontaneityPropertiesThrombus Aging +---------+---------------+---------+-----------+----------+--------------+ CFV      Full           Yes      Yes                                 +---------+---------------+---------+-----------+----------+--------------+ SFJ       Full                                                        +---------+---------------+---------+-----------+----------+--------------+ FV Prox  Full                                                        +---------+---------------+---------+-----------+----------+--------------+  FV Mid   Full                                                        +---------+---------------+---------+-----------+----------+--------------+ FV DistalFull                                                        +---------+---------------+---------+-----------+----------+--------------+ PFV      Full                                                        +---------+---------------+---------+-----------+----------+--------------+ POP      Full           Yes      Yes                                 +---------+---------------+---------+-----------+----------+--------------+ PTV      Full                                                        +---------+---------------+---------+-----------+----------+--------------+ PERO     Full                                                        +---------+---------------+---------+-----------+----------+--------------+   +----+---------------+---------+-----------+----------+--------------+ LEFTCompressibilityPhasicitySpontaneityPropertiesThrombus Aging +----+---------------+---------+-----------+----------+--------------+ CFV Full           Yes      Yes                                 +----+---------------+---------+-----------+----------+--------------+     Summary: RIGHT: - There is no evidence of deep vein thrombosis in the lower extremity.  - No cystic structure found in the popliteal fossa.  LEFT: - No evidence of common femoral vein obstruction.  *See table(s) above for measurements and observations.    Preliminary    DG Knee Complete 4 Views Right  Result Date: 11/08/2022 CLINICAL DATA:  Fall with right-sided knee pain  EXAM: RIGHT KNEE - COMPLETE 4+ VIEW COMPARISON:  None Available. FINDINGS: No fracture or malalignment. Mild medial and patellofemoral degenerative changes. No sizable effusion IMPRESSION: Mild degenerative changes. Electronically Signed   By: Jasmine Pang M.D.   On: 11/08/2022 19:49    Procedures .Ortho Injury Treatment  Date/Time: 11/10/2022 9:28 AM  Performed by: Linwood Dibbles, PA-C Authorized by: Linwood Dibbles, PA-C   Consent:    Consent obtained:  Verbal   Consent given by:  Patient   Risks discussed:  Fracture, nerve damage, restricted joint movement, vascular damage, stiffness, recurrent dislocation and irreducible dislocation  Alternatives discussed:  No treatment, alternative treatment, immobilization, referral and delayed treatmentInjury location: knee Location details: right knee Injury type: soft tissue Pre-procedure neurovascular assessment: neurovascularly intact Pre-procedure distal perfusion: normal Pre-procedure neurological function: normal  Anesthesia: Local anesthesia used: no  Patient sedated: NoImmobilization: brace Splint Applied by: Milon Dikes Post-procedure neurovascular assessment: post-procedure neurovascularly intact Post-procedure distal perfusion: normal Post-procedure neurological function: normal Post-procedure range of motion: normal       Medications Ordered in ED Medications  acetaminophen (TYLENOL) tablet 650 mg (650 mg Oral Given 11/10/22 0826)    ED Course/ Medical Decision Making/ A&P   57 year old here for evaluation right knee pain s/p mechanical fall 2 days ago.  Initially seen here in the emergency [facility] had x-rays performed however left prior to being seen by provider.  Patient neurovascularly intact.  Low suspicion for vascular injury.  Compartments are soft, low suspicion for compartment syndrome.  No overlying skin changes to suggest infectious process such as septic joint, gout, cellulitis or abscess.  No midline  back pain to suggest radiculopathy.  Nontender at tib-fib, femur, foot.  Diffuse tenderness to popliteal fossa on right as well as bilateral joint line.  Able to straight leg raise.  I reviewed her x-rays from 2 days ago.  No fracture, effusion, does show arthritic changes we will plan on ultrasound to rule out VTE.  Personally viewed and interpreted:  X-ray right knee with degenerative changes, no fracture, effusion [place] neg for DVT  Discussed results with patient.  She was placed in Knee sleeve, discussed range of motion, pain management, will however follow-up with orthopedics if her symptoms do not improve for the next few days.  The patient has been appropriately medically screened and/or stabilized in the ED. I have low suspicion for any other emergent medical condition which would require further screening, evaluation or treatment in the ED or require inpatient management.  Patient is hemodynamically stable and in no acute distress.  Patient able to ambulate in [facility] prior to ED.  Evaluation does not show acute pathology that would require ongoing or additional emergent interventions while in the emergency [facility] or further inpatient treatment.  I have discussed the diagnosis with the patient and answered all questions.  Pain is been managed while in the emergency [facility] and patient has no further complaints prior to discharge.  Patient is comfortable with plan discussed in room and is stable for discharge at this time.  I have discussed strict return precautions for returning to the emergency [facility].  Patient was encouraged to follow-up with PCP/specialist refer to at discharge.                                 Medical Decision Making Amount and/or Complexity of Data Reviewed External Data Reviewed: labs, radiology and notes. Radiology: ordered and independent interpretation performed. Decision-making details documented in ED Course.  Risk OTC drugs. Prescription drug  management. Diagnosis or treatment significantly limited by social determinants of health.         Final Clinical Impression(s) / ED Diagnoses Final diagnoses:  Fall, initial encounter  Acute pain of right knee    Rx / DC Orders ED Discharge Orders          Ordered    naproxen (NAPROSYN) 500 MG tablet  2 times daily        11/10/22 0920    lidocaine (LIDODERM) 5 %  Every 24 hours  11/10/22 0920              Talesha Ellithorpe A, PA-C 11/10/22 0929    Loetta Rough, MD 11/10/22 1020

## 2023-01-18 ENCOUNTER — Encounter (HOSPITAL_COMMUNITY): Payer: Self-pay | Admitting: *Deleted

## 2023-09-26 ENCOUNTER — Other Ambulatory Visit: Payer: Self-pay | Admitting: Cardiology

## 2023-10-20 ENCOUNTER — Emergency Department (HOSPITAL_BASED_OUTPATIENT_CLINIC_OR_DEPARTMENT_OTHER)

## 2023-10-20 ENCOUNTER — Emergency Department (HOSPITAL_BASED_OUTPATIENT_CLINIC_OR_DEPARTMENT_OTHER)
Admission: EM | Admit: 2023-10-20 | Discharge: 2023-10-21 | Disposition: A | Discharge status: Home / Self Care | Attending: Emergency Medicine | Admitting: Emergency Medicine

## 2023-10-20 ENCOUNTER — Other Ambulatory Visit: Payer: Self-pay

## 2023-10-20 DIAGNOSIS — Z79899 Other long term (current) drug therapy: Secondary | ICD-10-CM | POA: Diagnosis not present

## 2023-10-20 DIAGNOSIS — R7989 Other specified abnormal findings of blood chemistry: Secondary | ICD-10-CM | POA: Diagnosis not present

## 2023-10-20 DIAGNOSIS — I10 Essential (primary) hypertension: Secondary | ICD-10-CM | POA: Insufficient documentation

## 2023-10-20 DIAGNOSIS — R519 Headache, unspecified: Secondary | ICD-10-CM | POA: Diagnosis present

## 2023-10-20 DIAGNOSIS — R630 Anorexia: Secondary | ICD-10-CM | POA: Insufficient documentation

## 2023-10-20 DIAGNOSIS — Z7984 Long term (current) use of oral hypoglycemic drugs: Secondary | ICD-10-CM | POA: Diagnosis not present

## 2023-10-20 DIAGNOSIS — E119 Type 2 diabetes mellitus without complications: Secondary | ICD-10-CM | POA: Diagnosis not present

## 2023-10-20 DIAGNOSIS — R112 Nausea with vomiting, unspecified: Secondary | ICD-10-CM | POA: Insufficient documentation

## 2023-10-20 DIAGNOSIS — R7401 Elevation of levels of liver transaminase levels: Secondary | ICD-10-CM | POA: Insufficient documentation

## 2023-10-20 LAB — CBC WITH DIFFERENTIAL/PLATELET
Abs Immature Granulocytes: 0.05 K/uL (ref 0.00–0.07)
Basophils Absolute: 0 K/uL (ref 0.0–0.1)
Basophils Relative: 0 %
Eosinophils Absolute: 0.4 K/uL (ref 0.0–0.5)
Eosinophils Relative: 4 %
HCT: 32 % — ABNORMAL LOW (ref 36.0–46.0)
Hemoglobin: 10.6 g/dL — ABNORMAL LOW (ref 12.0–15.0)
Immature Granulocytes: 1 %
Lymphocytes Relative: 16 %
Lymphs Abs: 1.4 K/uL (ref 0.7–4.0)
MCH: 26.8 pg (ref 26.0–34.0)
MCHC: 33.1 g/dL (ref 30.0–36.0)
MCV: 80.8 fL (ref 80.0–100.0)
Monocytes Absolute: 0.7 K/uL (ref 0.1–1.0)
Monocytes Relative: 8 %
Neutro Abs: 6.4 K/uL (ref 1.7–7.7)
Neutrophils Relative %: 71 %
Platelets: 226 K/uL (ref 150–400)
RBC: 3.96 MIL/uL (ref 3.87–5.11)
RDW: 14.3 % (ref 11.5–15.5)
WBC: 8.9 K/uL (ref 4.0–10.5)
nRBC: 0 % (ref 0.0–0.2)

## 2023-10-20 LAB — URINALYSIS, ROUTINE W REFLEX MICROSCOPIC
Bacteria, UA: NONE SEEN
Bilirubin Urine: NEGATIVE
Glucose, UA: NEGATIVE mg/dL
Hgb urine dipstick: NEGATIVE
Ketones, ur: NEGATIVE mg/dL
Nitrite: NEGATIVE
Specific Gravity, Urine: 1.026 (ref 1.005–1.030)
pH: 6 (ref 5.0–8.0)

## 2023-10-20 LAB — RESP PANEL BY RT-PCR (RSV, FLU A&B, COVID)  RVPGX2
Influenza A by PCR: NEGATIVE
Influenza B by PCR: NEGATIVE
Resp Syncytial Virus by PCR: NEGATIVE
SARS Coronavirus 2 by RT PCR: NEGATIVE

## 2023-10-20 LAB — COMPREHENSIVE METABOLIC PANEL WITH GFR
ALT: 88 U/L — ABNORMAL HIGH (ref 0–44)
AST: 115 U/L — ABNORMAL HIGH (ref 15–41)
Albumin: 4 g/dL (ref 3.5–5.0)
Alkaline Phosphatase: 236 U/L — ABNORMAL HIGH (ref 38–126)
Anion gap: 15 (ref 5–15)
BUN: 23 mg/dL — ABNORMAL HIGH (ref 6–20)
CO2: 21 mmol/L — ABNORMAL LOW (ref 22–32)
Calcium: 9.6 mg/dL (ref 8.9–10.3)
Chloride: 102 mmol/L (ref 98–111)
Creatinine, Ser: 1.32 mg/dL — ABNORMAL HIGH (ref 0.44–1.00)
GFR, Estimated: 46 mL/min — ABNORMAL LOW (ref >60)
Glucose, Bld: 137 mg/dL — ABNORMAL HIGH (ref 70–99)
Potassium: 3.6 mmol/L (ref 3.5–5.1)
Sodium: 138 mmol/L (ref 135–145)
Total Bilirubin: 0.3 mg/dL (ref 0.0–1.2)
Total Protein: 7.6 g/dL (ref 6.5–8.1)

## 2023-10-20 LAB — TROPONIN T, HIGH SENSITIVITY: Troponin T High Sensitivity: <15 ng/L (ref <19)

## 2023-10-20 LAB — CBG MONITORING, ED: Glucose-Capillary: 120 mg/dL — ABNORMAL HIGH (ref 70–99)

## 2023-10-20 MED ORDER — KETOROLAC TROMETHAMINE 15 MG/ML IJ SOLN
15.0000 mg | Freq: Once | INTRAMUSCULAR | Status: AC
  Administered 2023-10-20: 15 mg via INTRAVENOUS
  Filled 2023-10-20: qty 1

## 2023-10-20 MED ORDER — PROCHLORPERAZINE EDISYLATE 10 MG/2ML IJ SOLN
10.0000 mg | Freq: Once | INTRAMUSCULAR | Status: AC
  Administered 2023-10-20: 10 mg via INTRAVENOUS
  Filled 2023-10-20: qty 2

## 2023-10-20 MED ORDER — DIPHENHYDRAMINE HCL 50 MG/ML IJ SOLN
25.0000 mg | Freq: Once | INTRAMUSCULAR | Status: AC
  Administered 2023-10-20: 25 mg via INTRAVENOUS
  Filled 2023-10-20: qty 1

## 2023-10-20 MED ORDER — SODIUM CHLORIDE 0.9 % IV BOLUS
1000.0000 mL | Freq: Once | INTRAVENOUS | Status: AC
  Administered 2023-10-20: 1000 mL via INTRAVENOUS

## 2023-10-20 NOTE — ED Triage Notes (Signed)
 Pt POV reporting posterior headache past few days, concerned for dehydration. Denies blurred vision or weakness, NAD noted at this time.

## 2023-10-20 NOTE — ED Notes (Signed)
 Assumed care of pt, found her alert and oriented in bed w/ visitor bedside.  She states she feels much better, pain is only a 1/10.  Visitor wants to know how much longer on test results.  Provider notified.

## 2023-10-20 NOTE — ED Provider Notes (Signed)
 [facility] AT [facility] Provider Note   CSN: [id number] Arrival date & time: 10/20/23  1909     Patient presents with: Headache   Stacey Riddle is a 58 y.o. female with past medical history of T2DM, HTN, PCOS, GERD, kidney stones, neuropathy, cholecystomy presents emergency department for evaluation of headache, vomiting, decreased appetite.  Reports that she has had intermittent headache every day since last weekend.  Headache is located from front and goes over top of cranium into back of head.  Pain described as a heaviness.  Pain waxes and wanes throughout the day with maximum intensity of 6/10.  Has tried Tylenol , ibuprofen  for headache without relief.  No history of migraines.  Denies fever, neck pain, visual disturbances, numbness, nor weakness.  She also complains of decreased appetite, vomiting.  Reports that she vomited 3 times yesterday.  She is worried about dehydration.  Recently had a negative COVID test.  Denies abdominal pain, diarrhea, suspicious foods, recent travel, known sick contacts.     Headache      Prior to Admission medications   Medication Sig Start Date End Date Taking? Authorizing Provider  ondansetron  (ZOFRAN -ODT) 4 MG disintegrating tablet Take 1 tablet (4 mg total) by mouth every 8 (eight) hours as needed for nausea or vomiting. 10/21/23  Yes Minnie Tinnie BRAVO, PA  amLODipine (NORVASC) 5 MG tablet Take 5 mg by mouth daily.    [provider]  cetirizine (ZYRTEC) 10 MG tablet Take 10 mg by mouth daily.    [provider]  DULoxetine (CYMBALTA) 20 MG capsule Take 20 mg by mouth daily.    [provider]  losartan  (COZAAR ) 100 MG tablet Take 100 mg by mouth daily.     [provider]  metFORMIN (GLUCOPHAGE) 500 MG tablet Take 500 mg by mouth 2 (two) times daily. 05/29/19   [provider]  metoprolol  succinate (TOPROL -XL) 25 MG 24 hr tablet TAKE 1 TABLET BY MOUTH DAILY 09/26/23    Patwardhan, Newman PARAS, MD  Multiple Vitamin (MULTIVITAMIN WITH MINERALS) TABS Take 1 tablet by mouth at bedtime.     [provider]  Probiotic Product (PRO-BIOTIC BLEND PO) Take by mouth.    [provider]  rosuvastatin (CRESTOR) 10 MG tablet Take 10 mg by mouth every Monday, Wednesday, and Friday. 01/28/19   [provider]  Semaglutide,0.25 or 0.5MG /DOS, (OZEMPIC, 0.25 OR 0.5 MG/DOSE,) 2 MG/1.5ML SOPN Inject into the skin once a week.    [provider]    Allergies: Biaxin [clarithromycin]    Review of Systems  Neurological:  Positive for headaches.    Updated Vital Signs BP 118/73   Pulse 79   Temp 98.1 F (36.7 C) (Oral)   Resp 15   Ht 5' 2 (1.575 m)   Wt 68 kg   SpO2 97%   BMI 27.44 kg/m   Physical Exam Vitals and nursing note reviewed.  Constitutional:      General: She is not in acute distress.    Appearance: Normal appearance.  HENT:     Head: Normocephalic and atraumatic.  Eyes:     General: Lids are normal. Vision grossly intact. No visual field deficit.    Extraocular Movements:     Right eye: Normal extraocular motion and no nystagmus.     Left eye: Normal extraocular motion and no nystagmus.     Conjunctiva/sclera: Conjunctivae normal.  Cardiovascular:     Rate and Rhythm: Normal rate.  Pulmonary:  Effort: Pulmonary effort is normal. No respiratory distress.     Breath sounds: Normal breath sounds.  Abdominal:     General: Bowel sounds are normal. There is no distension.     Palpations: Abdomen is soft.     Tenderness: There is no abdominal tenderness. There is no right CVA tenderness, left CVA tenderness, guarding or rebound.     Comments: Nonsurgical abdomen with no peritoneal signs  Musculoskeletal:     Cervical back: Normal range of motion and neck supple. No rigidity.  Skin:    Capillary Refill: Capillary refill takes less than 2 seconds.     Coloration: Skin is not jaundiced or pale.  Neurological:      Mental Status: She is alert and oriented to person, place, and time. Mental status is at baseline.     GCS: GCS eye subscore is 4. GCS verbal subscore is 5. GCS motor subscore is 6.     Cranial Nerves: No cranial nerve deficit, dysarthria or facial asymmetry.     Sensory: Sensation is intact. No sensory deficit.     Motor: No weakness, tremor, atrophy, abnormal muscle tone, seizure activity or pronator drift.     Coordination: Coordination normal. Finger-Nose-Finger Test and Heel to [place] Test normal.     Gait: Gait is intact.    (all labs ordered are listed, but only abnormal results are displayed) Labs Reviewed  CBC WITH DIFFERENTIAL/PLATELET - Abnormal; Notable for the following components:      Result Value   Hemoglobin 10.6 (*)    HCT 32.0 (*)    All other components within normal limits  COMPREHENSIVE METABOLIC PANEL WITH GFR - Abnormal; Notable for the following components:   CO2 21 (*)    Glucose, Bld 137 (*)    BUN 23 (*)    Creatinine, Ser 1.32 (*)    AST 115 (*)    ALT 88 (*)    Alkaline Phosphatase 236 (*)    GFR, Estimated 46 (*)    All other components within normal limits  URINALYSIS, ROUTINE W REFLEX MICROSCOPIC - Abnormal; Notable for the following components:   Protein, ur TRACE (*)    Leukocytes,Ua TRACE (*)    All other components within normal limits  CBG MONITORING, ED - Abnormal; Notable for the following components:   Glucose-Capillary 120 (*)    All other components within normal limits  RESP PANEL BY RT-PCR (RSV, FLU A&B, COVID)  RVPGX2  TROPONIN T, HIGH SENSITIVITY    EKG: None  Radiology: CT Head Wo Contrast Result Date: 10/20/2023 CLINICAL DATA:  Posterior headaches and neck pain, initial encounter EXAM: CT HEAD WITHOUT CONTRAST CT CERVICAL SPINE WITHOUT CONTRAST TECHNIQUE: Multidetector CT imaging of the head and cervical spine was performed following the standard protocol without intravenous contrast. Multiplanar CT image reconstructions of the  cervical spine were also generated. RADIATION DOSE REDUCTION: This exam was performed according to the departmental dose-optimization program which includes automated exposure control, adjustment of the mA and/or kV according to patient size and/or use of iterative reconstruction technique. COMPARISON:  06/14/2016 FINDINGS: CT HEAD FINDINGS Brain: No evidence of acute infarction, hemorrhage, hydrocephalus, extra-axial collection or mass lesion/mass effect. Vascular: No hyperdense vessel or unexpected calcification. Skull: Normal. Negative for fracture or focal lesion. Sinuses/Orbits: No acute finding. Other: None. CT CERVICAL SPINE FINDINGS Alignment: Within normal limits. Skull base and vertebrae: 7 cervical segments are well visualized. Vertebral body height is well maintained. No acute fracture or acute facet abnormality is seen.  The odontoid is within normal limits. Mild osteophytic changes are noted. Soft tissues and spinal canal: Surrounding soft tissue structures are within limits. Upper chest: Visualized lung apices are within normal limits. Other: None IMPRESSION: CT of the head: No acute intracranial abnormality noted. CT of the cervical spine: No acute abnormality is seen. Electronically Signed   By: Oneil Devonshire M.D.   On: 10/20/2023 21:31   CT CERVICAL SPINE WO CONTRAST Result Date: 10/20/2023 CLINICAL DATA:  Posterior headaches and neck pain, initial encounter EXAM: CT HEAD WITHOUT CONTRAST CT CERVICAL SPINE WITHOUT CONTRAST TECHNIQUE: Multidetector CT imaging of the head and cervical spine was performed following the standard protocol without intravenous contrast. Multiplanar CT image reconstructions of the cervical spine were also generated. RADIATION DOSE REDUCTION: This exam was performed according to the departmental dose-optimization program which includes automated exposure control, adjustment of the mA and/or kV according to patient size and/or use of iterative reconstruction technique.  COMPARISON:  06/14/2016 FINDINGS: CT HEAD FINDINGS Brain: No evidence of acute infarction, hemorrhage, hydrocephalus, extra-axial collection or mass lesion/mass effect. Vascular: No hyperdense vessel or unexpected calcification. Skull: Normal. Negative for fracture or focal lesion. Sinuses/Orbits: No acute finding. Other: None. CT CERVICAL SPINE FINDINGS Alignment: Within normal limits. Skull base and vertebrae: 7 cervical segments are well visualized. Vertebral body height is well maintained. No acute fracture or acute facet abnormality is seen. The odontoid is within normal limits. Mild osteophytic changes are noted. Soft tissues and spinal canal: Surrounding soft tissue structures are within limits. Upper chest: Visualized lung apices are within normal limits. Other: None IMPRESSION: CT of the head: No acute intracranial abnormality noted. CT of the cervical spine: No acute abnormality is seen. Electronically Signed   By: Oneil Devonshire M.D.   On: 10/20/2023 21:31     Medications Ordered in the ED  ketorolac  (TORADOL ) 15 MG/ML injection 15 mg (15 mg Intravenous Given 10/20/23 2155)  prochlorperazine  (COMPAZINE ) injection 10 mg (10 mg Intravenous Given 10/20/23 2158)  diphenhydrAMINE  (BENADRYL ) injection 25 mg (25 mg Intravenous Given 10/20/23 2152)  sodium chloride  0.9 % bolus 1,000 mL (1,000 mLs Intravenous New Bag/Given 10/20/23 2344)                                    Medical Decision Making Amount and/or Complexity of Data Reviewed Labs: ordered. Radiology: ordered.  Risk Prescription drug management.   Patient presents to the ED for concern of headache, vomiting, this involves an extensive number of treatment options, and is a complaint that carries with it a high risk of complications and morbidity.  The differential diagnosis includes ACS, dehydration, AKI, gastroenteritis, intra-abdominal pathology, electrolyte abnormality, hypoglycemia, hypoglycemia, CVA/TIA, symptomatic anemia   Co  morbidities that complicate the patient evaluation  See HPI   Additional history obtained:  Additional history obtained from Nursing   External records from outside source obtained and reviewed including triage note   Lab Tests:  I Ordered, and personally interpreted labs.  The pertinent results include:   Troponin WNL Respiratory panel negative CBG 137 Creatinine 1.32 (baseline 0.75-1.12 over past 4 years) AST 115 (was 18 three years ago) ALT 88 (was 10 three years ago) ALP 236 (was 101 three years ago) UA without infection   Imaging Studies ordered:  I ordered imaging studies including CT head, cervical spine I independently visualized and interpreted imaging which showed no acute intracranial abnormality I agree with the radiologist  interpretation   Cardiac Monitoring:  The patient was maintained on a cardiac monitor.  I personally viewed and interpreted the cardiac monitored which showed an underlying rhythm of: NSR 86 bpm with RBBB.  Noted to have RBBB on 06/16/2022 EKG so unchanged from previous   Medicines ordered and prescription drug management:  I ordered medication including Compazine , Toradol , Benadryl , IVF for headache, AKI, hydration Reevaluation of the patient after these medicines showed that the patient improved I have reviewed the patients home medicines and have made adjustments as needed     Problem List / ED Course:  HA No nuchal rigidity, fever, nor tachycardia.  No leukocytosis.  Low suspicion for meningitis Obtained CT head that was negative for ICH She is neurologically intact with no dizziness, focal deficits, motor or sensory deficits.  Low suspicion for CVA/TIA Complete resolution of HA following migraine cocktail Able to ambulate wo difficulty, weakness Vomiting Decreased appetite Elevated creatinine Cardiac workup reassuring with troponin WNL.  EKG without ST nor T wave abnormalities. No abdominal pain nor diarrhea.  Symptoms may  be related to gastroenteritis versus dehydration but otherwise she appears well.  No signs of sepsis Urine without infection Will provide IVF for hydration and AKI which is likely 2/2 vomiting, dehydration Had extensive discussion with pt and pt's husband regarding discharge vs admission for AKI. Unfortunately I do not see a creatinine sooner than 3 yrs ago on chart review nor care everywhere so this may be baseline. Patient reports she has kidney issues but is unsure what specifically. However, provided IVF for elevated creatinine and recommended pt obtain baseline labs with PCP on her next appt on 11/10/23 as this needs to be trended. Shared decision making is had with pt and family who prefer to be discharged and f/u with pcp regarding creatinine vs admission.  Will provide zofran  prescription Passed po challenge Pt's husband lives with pt and will be able to monitor her and ensure she goes to PCP appointment on 11/10/23 Transaminitis No recent labs beyond 3 yrs ago found on chart review nor care everywhere Pt is unsure whether she normally has elevated liver enzymes Denies recent etoh use. Has been using tylenol  occasionally for HA  Hx of cholecystectomy. No abd tenderness with light nor deep palpation Could be related to hepatitis but will have PCP work this up as they are mildly elevated and she has no acute symptoms and otherwise reassuring workup. Labs wouldn't result by time of DC Did recommend no additional tylenol  usage nor etoh use   Reevaluation:  After the interventions noted above, I reevaluated the patient and found that they have :improved   Social Determinants of Health:  Has pcp f/u   Dispostion:  After consideration of the diagnostic results and the patients response to treatment, I feel that the patent would benefit from outpatient management with PCP f/u.   Discussed ED workup, disposition, return to ED precautions with patient who expresses understanding agrees  with plan.  All questions answered to their satisfaction.  They are agreeable to plan.  Discharge instructions provided on paperwork  Final diagnoses:  Nausea and vomiting, unspecified vomiting type  Acute nonintractable headache, unspecified headache type  Decreased appetite  Elevated serum creatinine    ED Discharge Orders          Ordered    ondansetron  (ZOFRAN -ODT) 4 MG disintegrating tablet  Every 8 hours PRN        10/21/23 0003  Minnie Tinnie BRAVO, PA 10/21/23 [id number]    Lenor Hollering, MD 10/21/23 [phone]

## 2023-10-20 NOTE — ED Notes (Signed)
 Provider bedside updating pt.

## 2023-10-21 MED ORDER — ONDANSETRON 4 MG PO TBDP: 4.0000 mg | ORAL_TABLET | Freq: Three times a day (TID) | ORAL | 0 refills | Status: AC | PRN

## 2023-10-21 NOTE — ED Notes (Signed)
 PT given diet sprite for PO challenge and updated on progress.

## 2023-10-21 NOTE — Discharge Instructions (Addendum)
 Thank you for letting us  evaluate you today.  We have cured your headache with migraine cocktail via IV.  We provided you with IV fluids for hydration.  You are negative for COVID flu, RSV.  Your urine does not show any signs of infection.  Your heart enzyme was negative for heart injury.  Your CT head did not show any signs of intracranial bleeding.  As discussed, your creatinine/kidney function was mildly elevated.  Unfortunately I do not have a recent creatinine to compare this to.  We have given you fluids for this but I need you to follow-up with PCP regarding kidney function/creatinine monitoring. Also we found that your live enzymes   I provided you with Zofran  to use as needed for nausea, vomiting.  You may slowly introduce foods back into your diet and avoid spicy foods.  If you do not eat that is okay as long as you are able to drink Gatorade, Pedialyte, liquid IV, chicken broth for hydration.  Return to Emergency Department for experiencing if worsening of symptoms, chest pain, shortness of breath, altered mentation, numbness or weakness in one-sided body, inability ambulate, seizures

## 2023-10-21 NOTE — ED Provider Notes (Incomplete)
 [place] EMERGENCY DEPARTMENT AT [facility] Provider Note   CSN: [id number] Arrival date & time: 10/20/23  1909     Patient presents with: Headache   Stacey Riddle is a 58 y.o. female with past medical history of T2DM, HTN, PCOS, GERD, kidney stones, neuropathy presents emergency department for evaluation of headache, vomiting, decreased appetite.  Reports that she has had intermittent headache every day since last weekend.  Headache is located from front and goes over top of cranium into back of head.  Pain described as a heaviness.  Pain waxes and wanes throughout the day with maximum intensity of 6/10.  Has tried Tylenol , ibuprofen  for headache without relief.  No history of migraines.  Denies fever, neck pain, visual disturbances, numbness, nor weakness.  She also complains of decreased appetite, vomiting.  Reports that she vomited 3 times yesterday.  She is worried about dehydration.  Recently had a negative COVID test.  Denies abdominal pain, diarrhea, suspicious foods, recent travel, known sick contacts.   {Add pertinent medical, surgical, social history, OB history to HPI:[id number]}  Headache      Prior to Admission medications   Medication Sig Start Date End Date Taking? Authorizing Provider  amLODipine (NORVASC) 5 MG tablet Take 5 mg by mouth daily.    [provider]  cetirizine (ZYRTEC) 10 MG tablet Take 10 mg by mouth daily.    [provider]  DULoxetine (CYMBALTA) 20 MG capsule Take 20 mg by mouth daily.    [provider]  losartan  (COZAAR ) 100 MG tablet Take 100 mg by mouth daily.     [provider]  metFORMIN (GLUCOPHAGE) 500 MG tablet Take 500 mg by mouth 2 (two) times daily. 05/29/19   [provider]  metoprolol  succinate (TOPROL -XL) 25 MG 24 hr tablet TAKE 1 TABLET BY MOUTH DAILY 09/26/23   Patwardhan, Newman PARAS, MD  Multiple Vitamin (MULTIVITAMIN WITH MINERALS) TABS Take 1 tablet by mouth at bedtime.      [provider]  Probiotic Product (PRO-BIOTIC BLEND PO) Take by mouth.    [provider]  rosuvastatin (CRESTOR) 10 MG tablet Take 10 mg by mouth every Monday, Wednesday, and Friday. 01/28/19   [provider]  Semaglutide,0.25 or 0.5MG /DOS, (OZEMPIC, 0.25 OR 0.5 MG/DOSE,) 2 MG/1.5ML SOPN Inject into the skin once a week.    [provider]    Allergies: Biaxin [clarithromycin]    Review of Systems  Neurological:  Positive for headaches.    Updated Vital Signs BP (!) 149/78   Pulse 81   Temp 98.1 F (36.7 C) (Oral)   Resp 18   Ht 5' 2 (1.575 m)   Wt 68 kg   SpO2 94%   BMI 27.44 kg/m   Physical Exam Vitals and nursing note reviewed.  Constitutional:      General: She is not in acute distress.    Appearance: Normal appearance.  HENT:     Head: Normocephalic and atraumatic.  Eyes:     General: Lids are normal. Vision grossly intact. No visual field deficit.    Extraocular Movements:     Right eye: Normal extraocular motion and no nystagmus.     Left eye: Normal extraocular motion and no nystagmus.     Conjunctiva/sclera: Conjunctivae normal.  Cardiovascular:     Rate and Rhythm: Normal rate.  Pulmonary:     Effort: Pulmonary effort is normal. No respiratory distress.     Breath sounds: Normal breath sounds.  Abdominal:  General: Bowel sounds are normal. There is no distension.     Palpations: Abdomen is soft.     Tenderness: There is no abdominal tenderness. There is no guarding or rebound.  Musculoskeletal:     Cervical back: Normal range of motion and neck supple. No rigidity.  Skin:    Capillary Refill: Capillary refill takes less than 2 seconds.     Coloration: Skin is not jaundiced or pale.  Neurological:     Mental Status: She is alert and oriented to person, place, and time. Mental status is at baseline.     GCS: GCS eye subscore is 4. GCS verbal subscore is 5. GCS motor subscore is 6.     Cranial Nerves: No cranial  nerve deficit, dysarthria or facial asymmetry.     Sensory: Sensation is intact. No sensory deficit.     Motor: No weakness, tremor, atrophy, abnormal muscle tone, seizure activity or pronator drift.     Coordination: Coordination normal. Finger-Nose-Finger Test and Heel to [place] Test normal.     Gait: Gait is intact.     (all labs ordered are listed, but only abnormal results are displayed) Labs Reviewed  CBC WITH DIFFERENTIAL/PLATELET - Abnormal; Notable for the following components:      Result Value   Hemoglobin 10.6 (*)    HCT 32.0 (*)    All other components within normal limits  COMPREHENSIVE METABOLIC PANEL WITH GFR - Abnormal; Notable for the following components:   CO2 21 (*)    Glucose, Bld 137 (*)    BUN 23 (*)    Creatinine, Ser 1.32 (*)    AST 115 (*)    ALT 88 (*)    Alkaline Phosphatase 236 (*)    GFR, Estimated 46 (*)    All other components within normal limits  URINALYSIS, ROUTINE W REFLEX MICROSCOPIC - Abnormal; Notable for the following components:   Protein, ur TRACE (*)    Leukocytes,Ua TRACE (*)    All other components within normal limits  CBG MONITORING, ED - Abnormal; Notable for the following components:   Glucose-Capillary 120 (*)    All other components within normal limits  RESP PANEL BY RT-PCR (RSV, FLU A&B, COVID)  [id number]  TROPONIN T, HIGH SENSITIVITY    EKG: None  Radiology: CT Head Wo Contrast Result Date: 10/20/2023 CLINICAL DATA:  Posterior headaches and neck pain, initial encounter EXAM: CT HEAD WITHOUT CONTRAST CT CERVICAL SPINE WITHOUT CONTRAST TECHNIQUE: Multidetector CT imaging of the head and cervical spine was performed following the standard protocol without intravenous contrast. Multiplanar CT image reconstructions of the cervical spine were also generated. RADIATION DOSE REDUCTION: This exam was performed according to the departmental dose-optimization program which includes automated exposure control, adjustment of the mA and/or  kV according to patient size and/or use of iterative reconstruction technique. COMPARISON:  06/14/2016 FINDINGS: CT HEAD FINDINGS Brain: No evidence of acute infarction, hemorrhage, hydrocephalus, extra-axial collection or mass lesion/mass effect. Vascular: No hyperdense vessel or unexpected calcification. Skull: Normal. Negative for fracture or focal lesion. Sinuses/Orbits: No acute finding. Other: None. CT CERVICAL SPINE FINDINGS Alignment: Within normal limits. Skull base and vertebrae: 7 cervical segments are well visualized. Vertebral body height is well maintained. No acute fracture or acute facet abnormality is seen. The odontoid is within normal limits. Mild osteophytic changes are noted. Soft tissues and spinal canal: Surrounding soft tissue structures are within limits. Upper chest: Visualized lung apices are within normal limits. Other: None IMPRESSION: CT of the head:  No acute intracranial abnormality noted. CT of the cervical spine: No acute abnormality is seen. Electronically Signed   By: Oneil Devonshire M.D.   On: 10/20/2023 21:31   CT CERVICAL SPINE WO CONTRAST Result Date: 10/20/2023 CLINICAL DATA:  Posterior headaches and neck pain, initial encounter EXAM: CT HEAD WITHOUT CONTRAST CT CERVICAL SPINE WITHOUT CONTRAST TECHNIQUE: Multidetector CT imaging of the head and cervical spine was performed following the standard protocol without intravenous contrast. Multiplanar CT image reconstructions of the cervical spine were also generated. RADIATION DOSE REDUCTION: This exam was performed according to the departmental dose-optimization program which includes automated exposure control, adjustment of the mA and/or kV according to patient size and/or use of iterative reconstruction technique. COMPARISON:  06/14/2016 FINDINGS: CT HEAD FINDINGS Brain: No evidence of acute infarction, hemorrhage, hydrocephalus, extra-axial collection or mass lesion/mass effect. Vascular: No hyperdense vessel or unexpected  calcification. Skull: Normal. Negative for fracture or focal lesion. Sinuses/Orbits: No acute finding. Other: None. CT CERVICAL SPINE FINDINGS Alignment: Within normal limits. Skull base and vertebrae: 7 cervical segments are well visualized. Vertebral body height is well maintained. No acute fracture or acute facet abnormality is seen. The odontoid is within normal limits. Mild osteophytic changes are noted. Soft tissues and spinal canal: Surrounding soft tissue structures are within limits. Upper chest: Visualized lung apices are within normal limits. Other: None IMPRESSION: CT of the head: No acute intracranial abnormality noted. CT of the cervical spine: No acute abnormality is seen. Electronically Signed   By: Oneil Devonshire M.D.   On: 10/20/2023 21:31     Medications Ordered in the ED  ketorolac  (TORADOL ) 15 MG/ML injection 15 mg (15 mg Intravenous Given 10/20/23 2155)  prochlorperazine  (COMPAZINE ) injection 10 mg (10 mg Intravenous Given 10/20/23 2158)  diphenhydrAMINE  (BENADRYL ) injection 25 mg (25 mg Intravenous Given 10/20/23 2152)  sodium chloride  0.9 % bolus 1,000 mL (1,000 mLs Intravenous New Bag/Given 10/20/23 2344)      {Click here for ABCD2, HEART and other calculators REFRESH Note before signing:1}                              Medical Decision Making Amount and/or Complexity of Data Reviewed Labs: ordered. Radiology: ordered.  Risk Prescription drug management.   Patient presents to the ED for concern of headache, vomiting, this involves an extensive number of treatment options, and is a complaint that carries with it a high risk of complications and morbidity.  The differential diagnosis includes ACS, dehydration, AKI, gastroenteritis, intra-abdominal pathology, electrolyte abnormality, hypoglycemia, hypoglycemia, CVA/TIA, symptomatic anemia   Co morbidities that complicate the patient evaluation  See HPI   Additional history obtained:  Additional history obtained from  Nursing   External records from outside source obtained and reviewed including triage note   Lab Tests:  I Ordered, and personally interpreted labs.  The pertinent results include:   Troponin WNL Respiratory panel negative CBG 137 Creatinine 1.32 (baseline 0.75-1.12 over past 4 years) AST 115 (was 18 three years ago) ALT 88 (was 10 three years ago) ALP 236 (was 101 three years ago) UA without infection   Imaging Studies ordered:  I ordered imaging studies including CT head, cervical spine I independently visualized and interpreted imaging which showed no acute intracranial abnormality I agree with the radiologist interpretation   Cardiac Monitoring:  The patient was maintained on a cardiac monitor.  I personally viewed and interpreted the cardiac monitored which showed an underlying rhythm  of: NSR 86 bpm with RBBB.  Noted to have RBBB on 06/16/2022 EKG so unchanged from previous   Medicines ordered and prescription drug management:  I ordered medication including Compazine , Toradol , Benadryl , IVF for headache, AKI, hydration Reevaluation of the patient after these medicines showed that the patient improved I have reviewed the patients home medicines and have made adjustments as needed     Problem List / ED Course:  HA No nuchal rigidity, fever, nor tachycardia.  No leukocytosis.  Low suspicion for meningitis Obtained CT head that was negative for ICH She is neurologically intact with no dizziness, focal deficits, motor or sensory deficits.  Low suspicion for CVA/TIA Complete resolution of HA following migraine cocktail Vomiting Decreased appetite AKI Cardiac workup reassuring with troponin WNL.  EKG without ST nor T wave abnormalities. No abdominal pain nor diarrhea.  Symptoms may be related to gastroenteritis versus dehydration but otherwise she appears well.  No signs of sepsis Urine without infection Will provide IVF for hydration and AKI which is likely 2/2  vomiting, dehydration Had extensive discussion with pt and pt's husband regarding discharge vs admission for AKI. Unfortunately I do not see a creatinine sooner than 3 yrs ago on chart review nor care everywhere so this may be new baseline. However, provided IVF for elevated creatinine and recommended pt obtain baseline labs with PCP on her next appt on 11/10/23 as this needs to be trended. Shared decision making is had with pt and family who prefer to be discharged and f/u with pcp regarding creatinine vs admission.  Plan to provide zofran  prescription, po challenge Pt's husband lives with pt and will be able to monitor her and ensure she goes to PCP appointment   Reevaluation:  After the interventions noted above, I reevaluated the patient and found that they have :improved   Social Determinants of Health:  Has pcp f/u   Dispostion:  After consideration of the diagnostic results and the patients response to treatment, I feel that the patent would benefit from outpatient management with PCP f/u.   Discussed ED workup, disposition, return to ED precautions with patient who expresses understanding agrees with plan.  All questions answered to their satisfaction.  They are agreeable to plan.  Discharge instructions provided on paperwork  Final diagnoses:  Nausea and vomiting, unspecified vomiting type  Acute nonintractable headache, unspecified headache type  Decreased appetite    ED Discharge Orders     None

## 2023-10-21 NOTE — ED Notes (Addendum)
Pt passed PO challenge. Provider made aware.

## 2023-11-07 ENCOUNTER — Ambulatory Visit: Admitting: Family Medicine

## 2023-11-13 ENCOUNTER — Ambulatory Visit: Payer: Self-pay | Admitting: Family Medicine

## 2023-11-13 ENCOUNTER — Encounter: Payer: Self-pay | Admitting: Family Medicine

## 2023-11-13 ENCOUNTER — Ambulatory Visit: Admitting: Family Medicine

## 2023-11-13 VITALS — BP 128/84 | HR 85 | Temp 98.0°F | Ht 62.0 in | Wt 153.0 lb

## 2023-11-13 DIAGNOSIS — D638 Anemia in other chronic diseases classified elsewhere: Secondary | ICD-10-CM

## 2023-11-13 DIAGNOSIS — Z7984 Long term (current) use of oral hypoglycemic drugs: Secondary | ICD-10-CM

## 2023-11-13 DIAGNOSIS — Z79899 Other long term (current) drug therapy: Secondary | ICD-10-CM

## 2023-11-13 DIAGNOSIS — Z6827 Body mass index (BMI) 27.0-27.9, adult: Secondary | ICD-10-CM

## 2023-11-13 DIAGNOSIS — I10 Essential (primary) hypertension: Secondary | ICD-10-CM | POA: Diagnosis not present

## 2023-11-13 DIAGNOSIS — M47815 Spondylosis without myelopathy or radiculopathy, thoracolumbar region: Secondary | ICD-10-CM

## 2023-11-13 DIAGNOSIS — E1165 Type 2 diabetes mellitus with hyperglycemia: Secondary | ICD-10-CM | POA: Diagnosis not present

## 2023-11-13 DIAGNOSIS — Z7985 Long-term (current) use of injectable non-insulin antidiabetic drugs: Secondary | ICD-10-CM

## 2023-11-13 DIAGNOSIS — E783 Hyperchylomicronemia: Secondary | ICD-10-CM | POA: Diagnosis not present

## 2023-11-13 DIAGNOSIS — E559 Vitamin D deficiency, unspecified: Secondary | ICD-10-CM

## 2023-11-13 DIAGNOSIS — R748 Abnormal levels of other serum enzymes: Secondary | ICD-10-CM

## 2023-11-13 DIAGNOSIS — N951 Menopausal and female climacteric states: Secondary | ICD-10-CM

## 2023-11-13 LAB — CBC WITH DIFFERENTIAL/PLATELET
Basophils Absolute: 0.1 K/uL (ref 0.0–0.1)
Basophils Relative: 1.3 % (ref 0.0–3.0)
Eosinophils Absolute: 0.1 K/uL (ref 0.0–0.7)
Eosinophils Relative: 0.9 % (ref 0.0–5.0)
HCT: 35.6 % — ABNORMAL LOW (ref 36.0–46.0)
Hemoglobin: 11.5 g/dL — ABNORMAL LOW (ref 12.0–15.0)
Lymphocytes Relative: 23.1 % (ref 12.0–46.0)
Lymphs Abs: 2.3 K/uL (ref 0.7–4.0)
MCHC: 32.2 g/dL (ref 30.0–36.0)
MCV: 83 fl (ref 78.0–100.0)
Monocytes Absolute: 0.6 K/uL (ref 0.1–1.0)
Monocytes Relative: 5.7 % (ref 3.0–12.0)
Neutro Abs: 6.8 K/uL (ref 1.4–7.7)
Neutrophils Relative %: 69 % (ref 43.0–77.0)
Platelets: 303 K/uL (ref 150.0–400.0)
RBC: 4.29 Mil/uL (ref 3.87–5.11)
RDW: 16.3 % — ABNORMAL HIGH (ref 11.5–15.5)
WBC: 9.8 K/uL (ref 4.0–10.5)

## 2023-11-13 LAB — COMPREHENSIVE METABOLIC PANEL WITH GFR
ALT: 12 U/L (ref 0–35)
AST: 17 U/L (ref 0–37)
Albumin: 4.5 g/dL (ref 3.5–5.2)
Alkaline Phosphatase: 179 U/L — ABNORMAL HIGH (ref 39–117)
BUN: 16 mg/dL (ref 6–23)
CO2: 24 meq/L (ref 19–32)
Calcium: 9.5 mg/dL (ref 8.4–10.5)
Chloride: 102 meq/L (ref 96–112)
Creatinine, Ser: 0.86 mg/dL (ref 0.40–1.20)
GFR: 74.09 mL/min (ref >60.00)
Glucose, Bld: 96 mg/dL (ref 70–99)
Potassium: 3.9 meq/L (ref 3.5–5.1)
Sodium: 138 meq/L (ref 135–145)
Total Bilirubin: 0.4 mg/dL (ref 0.2–1.2)
Total Protein: 8.2 g/dL (ref 6.0–8.3)

## 2023-11-13 LAB — LIPID PANEL
Cholesterol: 155 mg/dL (ref 0–200)
HDL: 54.4 mg/dL (ref >39.00)
LDL Cholesterol: 86 mg/dL (ref 0–99)
NonHDL: 100.19
Total CHOL/HDL Ratio: 3
Triglycerides: 71 mg/dL (ref 0.0–149.0)
VLDL: 14.2 mg/dL (ref 0.0–40.0)

## 2023-11-13 LAB — HEMOGLOBIN A1C: Hgb A1c MFr Bld: 7.2 % — ABNORMAL HIGH (ref 4.6–6.5)

## 2023-11-13 LAB — TSH: TSH: 1.47 u[IU]/mL (ref 0.35–5.50)

## 2023-11-13 LAB — VITAMIN D 25 HYDROXY (VIT D DEFICIENCY, FRACTURES): VITD: 36.12 ng/mL (ref 30.00–100.00)

## 2023-11-13 LAB — MICROALBUMIN / CREATININE URINE RATIO
Creatinine,U: 235.1 mg/dL
Microalb Creat Ratio: 71.7 mg/g — ABNORMAL HIGH (ref 0.0–30.0)
Microalb, Ur: 16.9 mg/dL — ABNORMAL HIGH (ref 0.0–1.9)

## 2023-11-13 LAB — VITAMIN B12: Vitamin B-12: >1500 pg/mL — ABNORMAL HIGH (ref 211–911)

## 2023-11-13 MED ORDER — GLUCOSE BLOOD VI STRP
ORAL_STRIP | 12 refills | Status: AC
Start: 2023-11-13 — End: ?

## 2023-11-13 MED ORDER — TIRZEPATIDE 2.5 MG/0.5ML ~~LOC~~ SOAJ: 2.5000 mg | SUBCUTANEOUS | 0 refills | Status: DC

## 2023-11-13 NOTE — Patient Instructions (Addendum)
 Welcome to [facility]!  Thank you for choosing us  for your Primary Care needs.   We offer in person and video appointments for your convenience. You may call our office to schedule appointments, or you may schedule appointments with me through MyChart.   The best way to get in contact with me is via MyChart message. This will get to me faster than a phone call, unless there is an emergency, then please call 911.  The lab is located downstairs in the Sports Medicine building, we also have xray available there.   We are checking labs today, will be in contact with any results that require further attention.  We have placed a referral for you today. Someone will be reaching out to get you scheduled.

## 2023-11-13 NOTE — Progress Notes (Signed)
 New Patient Visit  Subjective:     Patient ID: Stacey Riddle, female    DOB: [birth year], 58 y.o.   MRN: [id number]  Chief Complaint  Patient presents with   Establish Care    Liver enzymes were high    HPI  Discussed the use of AI scribe software for clinical note transcription with the patient, who gave verbal consent to proceed.  History of Present Illness Stacey Riddle is a 58 year old female with elevated liver enzymes and diabetes who presents with fatigue and sleep disturbances. She is a previous patient of mine from [facility]  Hepatic dysfunction - Elevated liver enzymes identified during emergency room visit one month ago for persistent headaches - Follow-up testing confirmed persistently high liver enzymes - History of excessive acetaminophen  (Tylenol ) use for headache management - Uncertain about current liver enzyme status  Fatigue and sleep disturbance - Significant fatigue and sleepiness, especially after lunch - Exhaustion by 11 AM despite a demanding work schedule with children - Poor sleep quality at night - Low iron levels; taking iron supplements without significant improvement in fatigue  Diabetes mellitus - Diabetes managed with Ozempic 2 mg once weekly, metformin 1000 mg twice daily, and glipizide 5 mg once daily before breakfast - Previously took Jardiance 25 mg, discontinued due to itching - Concerns about weight gain and lack of weight loss benefit from Ozempic as previously experienced  Headache - Persistent headaches occurred one month ago, prompting emergency room evaluation  Musculoskeletal pain - Chronic back pain related to arthritis - Duloxetine taken at night for pain management, generally effective - History of excessive ibuprofen  and acetaminophen  use for pain control  Menopausal symptoms - Hot flashes and sweating - Using estrogen gel three times a week - Recently started on progesterone 100 mg after removal  of IUD - Sees Dr Estelle for GYN needs     ROS Per HPI  Outpatient Encounter Medications as of 11/13/2023  Medication Sig   amLODipine (NORVASC) 5 MG tablet Take 5 mg by mouth daily.   cetirizine (ZYRTEC) 10 MG tablet Take 10 mg by mouth daily.   DULoxetine (CYMBALTA) 20 MG capsule Take 20 mg by mouth daily.   estradiol (ESTRACE) 0.1 MG/GM vaginal cream Place 1 Applicatorful vaginally 3 (three) times a week.   glipiZIDE (GLUCOTROL) 5 MG tablet Take 5 mg by mouth daily before breakfast.   glucose blood test strip Use as instructed   losartan  (COZAAR ) 100 MG tablet Take 100 mg by mouth daily.    metFORMIN (GLUCOPHAGE) 500 MG tablet Take 500 mg by mouth 2 (two) times daily.   metoprolol  succinate (TOPROL -XL) 25 MG 24 hr tablet TAKE 1 TABLET BY MOUTH DAILY   Multiple Vitamin (MULTIVITAMIN WITH MINERALS) TABS Take 1 tablet by mouth at bedtime.    ondansetron  (ZOFRAN -ODT) 4 MG disintegrating tablet Take 1 tablet (4 mg total) by mouth every 8 (eight) hours as needed for nausea or vomiting.   Probiotic Product (PRO-BIOTIC BLEND PO) Take by mouth.   progesterone (PROMETRIUM) 100 MG capsule Take 100 mg by mouth daily.   rosuvastatin (CRESTOR) 10 MG tablet Take 10 mg by mouth every Monday, Wednesday, and Friday.   Semaglutide,0.25 or 0.5MG /DOS, (OZEMPIC, 0.25 OR 0.5 MG/DOSE,) 2 MG/1.5ML SOPN Inject into the skin once a week.   No facility-administered encounter medications on file as of 11/13/2023.    Past Medical History:  Diagnosis Date   GERD (gastroesophageal reflux disease)    H/O laparoscopic adjustable gastric  banding 04/09/2007   History of cervical dysplasia    History of hiatal hernia    S/P  REPAIR 04-09-2007   History of kidney stones    History of polycystic ovarian syndrome    Hypertension    Left ureteral stone    Low back pain    Neuropathy    Type 2 diabetes mellitus (HCC)    Wears contact lenses     Past Surgical History:  Procedure Laterality Date   CARPAL  TUNNEL RELEASE Bilateral right 2000/  left 10-02-2008   CESAREAN SECTION  03/16/2005   CHOLECYSTECTOMY N/A 11/23/2016   Procedure: LAPAROSCOPIC CHOLECYSTECTOMY WITH INTRAOPERATIVE CHOLANGIOGRAM;  Surgeon: Gladis Cough, MD;  Location: [facility];  Service: General;  Laterality: N/A;   CYSTOSCOPY WITH RETROGRADE PYELOGRAM, URETEROSCOPY AND STENT PLACEMENT Left 01/29/2017   Procedure: CYSTOSCOPY WITH RETROGRADE PYELOGRAM, URETEROSCOPY, LASER LITHOTRIPSY, STONE BASKETRY,  STENT PLACEMENT;  Surgeon: Chauncey Redell Agent, MD;  Location[facility];  Service: Urology;  Laterality: Left;   HOLMIUM LASER APPLICATION Left 01/29/2017   Procedure: HOLMIUM LASER APPLICATION;  Surgeon: Chauncey Redell Agent, MD;  Location: [facility];  Service: Urology;  Laterality: Left;   LAPAROSCOPIC GASTRIC BAND REMOVAL WITH LAPAROSCOPIC GASTRIC SLEEVE RESECTION N/A 07/01/2019   Procedure: LAPAROSCOPIC GASTRIC BAND REMOVAL WITH LAPAROSCOPIC GASTRIC SLEEVE RESECTION, Upper Endo, ERAS Pathway;  Surgeon: Gladis Cough, MD;  Location: [facility];  Service: General;  Laterality: N/A;   LAPAROSCOPIC GASTRIC BANDING WITH HIATAL HERNIA REPAIR  04-09-2007   dr gladis [facility]   REPLACEMENT LAP BAND PORT  02-14-2010   dr gladis [facility]    Family History  Problem Relation Age of Onset   Hyperlipidemia Mother    Osteoporosis Mother    Hypertension Mother    Breast cancer Mother    Heart disease Father    Diabetes Father    Hypertension Father    Kidney disease Sister    Heart attack Sister    Diabetes Maternal Grandmother     Social History   Socioeconomic History   Marital status: Married    Spouse name: Not on file   Number of children: 1   Years of education: one year college   Highest education level: Not on file  Occupational History   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    Comment: Mirena  Other Topics Concern   Not on file  Social History Narrative   Lives with her husband and daughter.   Caffeine use: one can of Diet Coke per day.   Right-handed.   Social Drivers of Corporate investment banker Strain: Not on file  Food Insecurity: Not on file  Transportation Needs: Not on file  Physical Activity: Not on file  Stress: Not on file  Social Connections: Not on file  Intimate Partner Violence: Not on file       Objective:    BP 128/84 (BP Location: Right Arm, Patient Position: Sitting, Cuff Size: Small)   Pulse 85   Temp 98 F (36.7 C)   Ht 5' 2 (1.575 m)   Wt 153 lb (69.4 kg)   SpO2 96%   BMI 27.98 kg/m  Physical Exam Vitals and nursing note reviewed.  Constitutional:      General: She is not in acute distress.    Appearance: Normal appearance.  HENT:     Head: Normocephalic and atraumatic.     Right Ear: External ear normal.     Left Ear: External ear normal.     Nose: Nose normal.     Mouth/Throat:     Mouth: Mucous membranes are moist.     Pharynx: Oropharynx is clear.  Eyes:     Extraocular Movements: Extraocular movements intact.     Pupils: Pupils are equal, round, and reactive to light.  Cardiovascular:     Rate and Rhythm: Normal rate and regular rhythm.     Pulses: Normal pulses.     Heart sounds: Normal heart sounds.  Pulmonary:     Effort: Pulmonary effort is normal. No respiratory distress.     Breath sounds: Normal breath sounds. No wheezing, rhonchi or rales.  Musculoskeletal:        General: Normal range of motion.     Cervical back: Normal range of motion.     Right lower leg: No edema.     Left lower leg: No edema.  Lymphadenopathy:     Cervical: No cervical adenopathy.  Neurological:     General: No focal deficit present.     Mental Status: She is alert  and oriented to person, place, and time.  Psychiatric:        Mood and Affect: Mood normal.        Thought Content: Thought content normal.     No results found for any visits on 11/13/23.      Assessment & Plan:   Assessment and Plan Assessment & Plan Type 2 diabetes mellitus Type 2 diabetes with recent medication adjustments. Concerns about weight gain and suboptimal glycemic control. Discussed potential switch to Mounjaro  for better control and weight management. - Order labs for A1c and glucose levels. - Consider switching from Ozempic to Mounjaro .  Elevated liver enzymes Elevated liver enzymes possibly due to high Tylenol  intake. Referral to GI recommended. - Order liver function tests. - Refer to GI specialist.  Fatigue and excessive daytime sleepiness Persistent fatigue and sleepiness possibly due to medication side effects, low iron, and potential thyroid dysfunction. Discussed potential low B12 levels due to low iron. - Order labs to check thyroid function. - Evaluate B12 levels.  Iron deficiency anemia Iron deficiency anemia with ongoing fatigue. Current iron supplementation not significantly improving symptoms. Discussed potential low B12 levels due to low iron. - Evaluate B12 levels.  Chronic low back pain due to osteoarthritis Chronic low back pain managed with duloxetine, providing significant relief. Previous excessive use of ibuprofen  and Tylenol  noted.  Menopausal symptoms Menopausal symptoms including hot flashes and sweating. Current management includes estrogen gel and progesterone. Discussed ongoing symptoms despite current management.  HTN - stable - CBC, CMP today - continue amlodipine and metoprolol   HLD - lipids today - continue rosuvastatin  Vit D Deficiency - Vit D today  Working on getting previous records   Orders Placed This Encounter  Procedures   CBC with Differential/Platelet    Release to patient:   Immediate [1]    Comprehensive metabolic panel with GFR    Release to patient:   Immediate [1]   Hemoglobin A1c   Lipid panel   Microalbumin / creatinine urine ratio    Release to patient:   Immediate   VITAMIN D  25 Hydroxy (Vit-D  Deficiency, Fractures)   Vitamin B12   Iron, TIBC and Ferritin Panel   TSH   Ambulatory referral to Gastroenterology    Referral Priority:   Routine    Referral Type:   Consultation    Referral Reason:   Specialty Services Required    Number of Visits Requested:   1     Meds ordered this encounter  Medications   glucose blood test strip    Sig: Use as instructed    Dispense:  100 each    Refill:  12    No follow-ups on file.  Corean LITTIE Ku, FNP

## 2023-11-14 LAB — IRON,TIBC AND FERRITIN PANEL
%SAT: 18 % (ref 16–45)
Ferritin: 29 ng/mL (ref 16–232)
Iron: 73 ug/dL (ref 45–160)
TIBC: 407 ug/dL (ref 250–450)

## 2023-12-07 ENCOUNTER — Telehealth: Payer: Self-pay | Admitting: Radiology

## 2023-12-07 ENCOUNTER — Other Ambulatory Visit: Payer: Self-pay | Admitting: Family Medicine

## 2023-12-07 DIAGNOSIS — E1165 Type 2 diabetes mellitus with hyperglycemia: Secondary | ICD-10-CM

## 2023-12-07 MED ORDER — TIRZEPATIDE 5 MG/0.5ML ~~LOC~~ SOAJ: 5.0000 mg | SUBCUTANEOUS | 0 refills | Status: DC

## 2023-12-07 NOTE — Telephone Encounter (Signed)
 Copied from CRM #[id number]. Topic: Clinical - Medication Question >> Dec 07, 2023  8:32 AM Treva T wrote: Reason for CRM: Received call from patient, inquiring if she can have a prescription sent to pharmacy for increased/next dose of Mounjaro .   Patient reports most recent dose was tirzepatide  (MOUNJARO ) 2.5 MG/0.5ML Pen, last dose taken on 12/03/23, only have one left for next week.  Patient can be reached if need to discuss further at [phone].  [facility] [address] ([address]), [place] - [address] [place] ([place]) [place] [address] Phone: [phone] Fax: [phone]

## 2023-12-07 NOTE — Telephone Encounter (Signed)
 Spoke with patient, let her know medication was sent in

## 2023-12-12 ENCOUNTER — Other Ambulatory Visit: Payer: Self-pay | Admitting: Family Medicine

## 2023-12-12 DIAGNOSIS — E1165 Type 2 diabetes mellitus with hyperglycemia: Secondary | ICD-10-CM

## 2023-12-28 ENCOUNTER — Other Ambulatory Visit: Payer: Self-pay | Admitting: Cardiology

## 2023-12-28 ENCOUNTER — Other Ambulatory Visit: Payer: Self-pay

## 2023-12-28 MED ORDER — CETIRIZINE HCL 10 MG PO TABS: 10.0000 mg | ORAL_TABLET | Freq: Every day | ORAL | 1 refills | Status: AC

## 2023-12-28 MED ORDER — DULOXETINE HCL 20 MG PO CPEP: 20.0000 mg | ORAL_CAPSULE | Freq: Every day | ORAL | 1 refills | Status: AC

## 2023-12-28 MED ORDER — METOPROLOL SUCCINATE ER 25 MG PO TB24: 25.0000 mg | ORAL_TABLET | Freq: Every day | ORAL | 0 refills | Status: DC

## 2023-12-28 MED ORDER — METFORMIN HCL 500 MG PO TABS: 500.0000 mg | ORAL_TABLET | Freq: Two times a day (BID) | ORAL | 3 refills | Status: AC

## 2023-12-28 MED ORDER — LOSARTAN POTASSIUM 100 MG PO TABS: 100.0000 mg | ORAL_TABLET | Freq: Every day | ORAL | 3 refills | Status: AC

## 2023-12-28 MED ORDER — GLIPIZIDE 5 MG PO TABS: 5.0000 mg | ORAL_TABLET | Freq: Every day | ORAL | 1 refills | Status: AC

## 2023-12-28 MED ORDER — ROSUVASTATIN CALCIUM 10 MG PO TABS: 10.0000 mg | ORAL_TABLET | ORAL | 3 refills | Status: AC

## 2024-01-04 ENCOUNTER — Other Ambulatory Visit: Payer: Self-pay | Admitting: Family Medicine

## 2024-01-04 DIAGNOSIS — E1165 Type 2 diabetes mellitus with hyperglycemia: Secondary | ICD-10-CM

## 2024-01-05 ENCOUNTER — Other Ambulatory Visit: Payer: Self-pay | Admitting: Family Medicine

## 2024-01-05 DIAGNOSIS — E1165 Type 2 diabetes mellitus with hyperglycemia: Secondary | ICD-10-CM

## 2024-01-07 ENCOUNTER — Other Ambulatory Visit: Payer: Self-pay

## 2024-01-07 DIAGNOSIS — E1165 Type 2 diabetes mellitus with hyperglycemia: Secondary | ICD-10-CM

## 2024-01-07 MED ORDER — TIRZEPATIDE 7.5 MG/0.5ML ~~LOC~~ SOAJ: 7.5000 mg | SUBCUTANEOUS | 1 refills | Status: DC

## 2024-01-07 NOTE — Telephone Encounter (Signed)
 Spoke with patient, she would like to increase dose. New dose sent in

## 2024-01-13 ENCOUNTER — Other Ambulatory Visit: Payer: Self-pay | Admitting: Family Medicine

## 2024-01-13 DIAGNOSIS — E1165 Type 2 diabetes mellitus with hyperglycemia: Secondary | ICD-10-CM

## 2024-01-20 ENCOUNTER — Other Ambulatory Visit: Payer: Self-pay | Admitting: Family Medicine

## 2024-01-20 DIAGNOSIS — E1165 Type 2 diabetes mellitus with hyperglycemia: Secondary | ICD-10-CM

## 2024-02-13 ENCOUNTER — Encounter: Payer: Self-pay | Admitting: Internal Medicine

## 2024-02-13 ENCOUNTER — Telehealth: Payer: Self-pay | Admitting: Family Medicine

## 2024-02-13 DIAGNOSIS — E1165 Type 2 diabetes mellitus with hyperglycemia: Secondary | ICD-10-CM

## 2024-02-13 NOTE — Telephone Encounter (Unsigned)
 Copied from CRM [id number]. Topic: Clinical - Medication Refill >> Feb 13, 2024 10:16 AM Harlene ORN wrote: Medication: tirzepatide  (MOUNJARO ) 7.5 MG/0.5ML Pen (Discuss upping to dosage of medication)  Has the patient contacted their pharmacy? No (Agent: If no, request that the patient contact the pharmacy for the refill. If patient does not wish to contact the pharmacy document the reason why and proceed with request.) (Agent: If yes, when and what did the pharmacy advise?)  This is the patient's preferred pharmacy:  [facility] [address] ([address]), [place] - [address] [place] ([place]) [place] [address] Phone: [phone] Fax: [phone]  Is this the correct pharmacy for this prescription? Yes If no, delete pharmacy and type the correct one.   Has the prescription been filled recently? Yes  Is the patient out of the medication? No  Has the patient been seen for an appointment in the last year OR does the patient have an upcoming appointment? Yes  Can we respond through MyChart? Yes  Agent: Please be advised that Rx refills may take up to 3 business days. We ask that you follow-up with your pharmacy.

## 2024-02-14 MED ORDER — TIRZEPATIDE 7.5 MG/0.5ML ~~LOC~~ SOAJ: 7.5000 mg | SUBCUTANEOUS | 1 refills | Status: AC

## 2024-02-14 NOTE — Telephone Encounter (Signed)
 Spoke with patient, Mounjaro  7.5 sent in. Diabetes follow up scheduled also

## 2024-02-19 ENCOUNTER — Ambulatory Visit: Admitting: Family Medicine

## 2024-03-12 ENCOUNTER — Ambulatory Visit: Admitting: Family Medicine

## 2024-03-12 NOTE — Progress Notes (Deleted)
° °  Acute Office Visit  Subjective:     Patient ID: Stacey Riddle, female    DOB: [birth year], 58 y.o.   MRN: [id number]  No chief complaint on file.   HPI  Discussed the use of AI scribe software for clinical note transcription with the patient, who gave verbal consent to proceed.  History of Present Illness      ROS Per HPI      Objective:    There were no vitals taken for this visit.   Physical Exam Vitals and nursing note reviewed.  Constitutional:      General: She is not in acute distress.    Appearance: Normal appearance. She is normal weight.  HENT:     Head: Normocephalic and atraumatic.     Right Ear: External ear normal.     Left Ear: External ear normal.     Nose: Nose normal.     Mouth/Throat:     Mouth: Mucous membranes are moist.     Pharynx: Oropharynx is clear.  Eyes:     Extraocular Movements: Extraocular movements intact.     Pupils: Pupils are equal, round, and reactive to light.  Cardiovascular:     Rate and Rhythm: Normal rate and regular rhythm.     Pulses: Normal pulses.     Heart sounds: Normal heart sounds.  Pulmonary:     Effort: Pulmonary effort is normal. No respiratory distress.     Breath sounds: Normal breath sounds. No wheezing, rhonchi or rales.  Musculoskeletal:        General: Normal range of motion.     Cervical back: Normal range of motion.     Right lower leg: No edema.     Left lower leg: No edema.  Lymphadenopathy:     Cervical: No cervical adenopathy.  Neurological:     General: No focal deficit present.     Mental Status: She is alert and oriented to person, place, and time.  Psychiatric:        Mood and Affect: Mood normal.        Thought Content: Thought content normal.     No results found for any visits on 03/12/24.      Assessment & Plan:   Assessment and Plan Assessment & Plan      No orders of the defined types were placed in this encounter.    No orders of the defined types were  placed in this encounter.   No follow-ups on file.  Corean LITTIE Ku, FNP

## 2024-03-21 ENCOUNTER — Encounter: Payer: Self-pay | Admitting: Gastroenterology

## 2024-04-03 ENCOUNTER — Ambulatory Visit (INDEPENDENT_AMBULATORY_CARE_PROVIDER_SITE_OTHER)

## 2024-04-03 DIAGNOSIS — Z23 Encounter for immunization: Secondary | ICD-10-CM | POA: Diagnosis not present

## 2024-04-03 NOTE — Progress Notes (Signed)
 Pt was given reg flu vaccine w/o any complications.

## 2024-04-17 ENCOUNTER — Other Ambulatory Visit: Payer: Self-pay | Admitting: Family Medicine

## 2024-04-23 ENCOUNTER — Ambulatory Visit: Admitting: Gastroenterology

## 2024-04-23 NOTE — Progress Notes (Deleted)
 SABRA

## 2024-05-16 ENCOUNTER — Ambulatory Visit: Admitting: Gastroenterology

## 2024-05-20 ENCOUNTER — Ambulatory Visit: Admitting: Family Medicine
